# Patient Record
Sex: Male | Born: 1970
Health system: Southern US, Community
[De-identification: ages and names within clinical notes are randomized; demographics above are authoritative.]

## PROBLEM LIST (undated history)

## (undated) DIAGNOSIS — Z22322 Carrier or suspected carrier of Methicillin resistant Staphylococcus aureus: Secondary | ICD-10-CM

## (undated) DIAGNOSIS — F329 Major depressive disorder, single episode, unspecified: Secondary | ICD-10-CM

## (undated) DIAGNOSIS — Z8719 Personal history of other diseases of the digestive system: Secondary | ICD-10-CM

## (undated) DIAGNOSIS — F1910 Other psychoactive substance abuse, uncomplicated: Secondary | ICD-10-CM

## (undated) DIAGNOSIS — N200 Calculus of kidney: Secondary | ICD-10-CM

## (undated) DIAGNOSIS — G8929 Other chronic pain: Secondary | ICD-10-CM

## (undated) DIAGNOSIS — M549 Dorsalgia, unspecified: Secondary | ICD-10-CM

## (undated) DIAGNOSIS — Z87442 Personal history of urinary calculi: Secondary | ICD-10-CM

## (undated) DIAGNOSIS — F32A Depression, unspecified: Secondary | ICD-10-CM

## (undated) DIAGNOSIS — I10 Essential (primary) hypertension: Secondary | ICD-10-CM

## (undated) DIAGNOSIS — F419 Anxiety disorder, unspecified: Secondary | ICD-10-CM

## (undated) HISTORY — DX: Anxiety disorder, unspecified: F41.9

## (undated) HISTORY — DX: Major depressive disorder, single episode, unspecified: F32.9

## (undated) HISTORY — PX: OTHER SURGICAL HISTORY: SHX169

## (undated) HISTORY — DX: Other psychoactive substance abuse, uncomplicated: F19.10

## (undated) HISTORY — PX: CHOLECYSTECTOMY: SHX55

## (undated) HISTORY — DX: Calculus of kidney: N20.0

## (undated) HISTORY — DX: Depression, unspecified: F32.A

## (undated) HISTORY — DX: Carrier or suspected carrier of methicillin resistant Staphylococcus aureus: Z22.322

---

## 1997-05-13 ENCOUNTER — Encounter (HOSPITAL_COMMUNITY): Admission: RE | Admit: 1997-05-13 | Discharge: 1997-08-11 | Payer: Self-pay

## 1999-05-30 ENCOUNTER — Emergency Department (HOSPITAL_COMMUNITY): Admission: EM | Admit: 1999-05-30 | Discharge: 1999-05-30 | Payer: Self-pay | Admitting: Emergency Medicine

## 1999-05-30 ENCOUNTER — Encounter: Payer: Self-pay | Admitting: Emergency Medicine

## 2001-05-11 ENCOUNTER — Emergency Department (HOSPITAL_COMMUNITY): Admission: EM | Admit: 2001-05-11 | Discharge: 2001-05-12 | Payer: Self-pay | Admitting: Emergency Medicine

## 2001-05-12 ENCOUNTER — Encounter: Payer: Self-pay | Admitting: Emergency Medicine

## 2001-05-13 ENCOUNTER — Emergency Department (HOSPITAL_COMMUNITY): Admission: EM | Admit: 2001-05-13 | Discharge: 2001-05-13 | Payer: Self-pay | Admitting: Emergency Medicine

## 2001-07-01 ENCOUNTER — Emergency Department (HOSPITAL_COMMUNITY): Admission: EM | Admit: 2001-07-01 | Discharge: 2001-07-01 | Payer: Self-pay | Admitting: Emergency Medicine

## 2001-07-01 ENCOUNTER — Encounter: Payer: Self-pay | Admitting: Emergency Medicine

## 2003-04-16 ENCOUNTER — Emergency Department (HOSPITAL_COMMUNITY): Admission: EM | Admit: 2003-04-16 | Discharge: 2003-04-16 | Payer: Self-pay | Admitting: Emergency Medicine

## 2003-09-13 ENCOUNTER — Emergency Department (HOSPITAL_COMMUNITY): Admission: EM | Admit: 2003-09-13 | Discharge: 2003-09-13 | Payer: Self-pay

## 2003-10-03 ENCOUNTER — Emergency Department (HOSPITAL_COMMUNITY): Admission: EM | Admit: 2003-10-03 | Discharge: 2003-10-03 | Payer: Self-pay | Admitting: Emergency Medicine

## 2003-10-14 ENCOUNTER — Ambulatory Visit: Payer: Self-pay | Admitting: Family Medicine

## 2003-10-20 ENCOUNTER — Ambulatory Visit: Payer: Self-pay | Admitting: *Deleted

## 2003-10-20 ENCOUNTER — Ambulatory Visit: Payer: Self-pay | Admitting: Family Medicine

## 2003-11-11 ENCOUNTER — Ambulatory Visit: Payer: Self-pay | Admitting: *Deleted

## 2003-11-12 ENCOUNTER — Emergency Department (HOSPITAL_COMMUNITY): Admission: EM | Admit: 2003-11-12 | Discharge: 2003-11-12 | Payer: Self-pay | Admitting: Family Medicine

## 2003-11-13 ENCOUNTER — Inpatient Hospital Stay (HOSPITAL_COMMUNITY): Admission: EM | Admit: 2003-11-13 | Discharge: 2003-11-19 | Payer: Self-pay | Admitting: Emergency Medicine

## 2003-11-13 ENCOUNTER — Ambulatory Visit: Payer: Self-pay | Admitting: Internal Medicine

## 2003-11-14 HISTORY — PX: OTHER SURGICAL HISTORY: SHX169

## 2003-11-21 ENCOUNTER — Ambulatory Visit: Payer: Self-pay | Admitting: Family Medicine

## 2004-01-11 ENCOUNTER — Emergency Department (HOSPITAL_COMMUNITY): Admission: EM | Admit: 2004-01-11 | Discharge: 2004-01-11 | Payer: Self-pay | Admitting: Unknown Physician Specialty

## 2004-04-26 ENCOUNTER — Ambulatory Visit: Payer: Self-pay | Admitting: Family Medicine

## 2004-04-27 ENCOUNTER — Emergency Department (HOSPITAL_COMMUNITY): Admission: EM | Admit: 2004-04-27 | Discharge: 2004-04-27 | Payer: Self-pay | Admitting: Emergency Medicine

## 2004-07-01 ENCOUNTER — Emergency Department (HOSPITAL_COMMUNITY): Admission: EM | Admit: 2004-07-01 | Discharge: 2004-07-01 | Payer: Self-pay | Admitting: Emergency Medicine

## 2004-09-25 ENCOUNTER — Emergency Department (HOSPITAL_COMMUNITY): Admission: EM | Admit: 2004-09-25 | Discharge: 2004-09-25 | Payer: Self-pay | Admitting: Family Medicine

## 2004-11-06 ENCOUNTER — Ambulatory Visit: Payer: Self-pay | Admitting: Family Medicine

## 2004-11-21 ENCOUNTER — Emergency Department (HOSPITAL_COMMUNITY): Admission: EM | Admit: 2004-11-21 | Discharge: 2004-11-22 | Payer: Self-pay | Admitting: Emergency Medicine

## 2004-12-18 ENCOUNTER — Ambulatory Visit: Payer: Self-pay | Admitting: Family Medicine

## 2004-12-27 ENCOUNTER — Ambulatory Visit: Payer: Self-pay | Admitting: Family Medicine

## 2005-01-14 ENCOUNTER — Inpatient Hospital Stay (HOSPITAL_COMMUNITY): Admission: EM | Admit: 2005-01-14 | Discharge: 2005-01-19 | Payer: Self-pay | Admitting: Emergency Medicine

## 2005-01-30 ENCOUNTER — Encounter: Payer: Self-pay | Admitting: Emergency Medicine

## 2005-01-31 ENCOUNTER — Inpatient Hospital Stay (HOSPITAL_COMMUNITY): Admission: EM | Admit: 2005-01-31 | Discharge: 2005-02-06 | Payer: Self-pay | Admitting: Internal Medicine

## 2005-01-31 ENCOUNTER — Ambulatory Visit: Payer: Self-pay | Admitting: Physical Medicine & Rehabilitation

## 2005-01-31 ENCOUNTER — Ambulatory Visit: Payer: Self-pay | Admitting: Internal Medicine

## 2005-02-15 ENCOUNTER — Encounter
Admission: RE | Admit: 2005-02-15 | Discharge: 2005-05-16 | Payer: Self-pay | Admitting: Physical Medicine & Rehabilitation

## 2005-02-15 ENCOUNTER — Ambulatory Visit: Payer: Self-pay | Admitting: Physical Medicine & Rehabilitation

## 2005-03-19 ENCOUNTER — Ambulatory Visit: Payer: Self-pay | Admitting: Physical Medicine & Rehabilitation

## 2005-04-16 ENCOUNTER — Ambulatory Visit: Payer: Self-pay | Admitting: Physical Medicine & Rehabilitation

## 2005-05-14 ENCOUNTER — Encounter
Admission: RE | Admit: 2005-05-14 | Discharge: 2005-08-12 | Payer: Self-pay | Admitting: Physical Medicine & Rehabilitation

## 2005-06-12 ENCOUNTER — Ambulatory Visit: Payer: Self-pay | Admitting: Physical Medicine & Rehabilitation

## 2005-07-28 ENCOUNTER — Inpatient Hospital Stay (HOSPITAL_COMMUNITY): Admission: EM | Admit: 2005-07-28 | Discharge: 2005-08-01 | Payer: Self-pay | Admitting: Emergency Medicine

## 2005-07-29 ENCOUNTER — Ambulatory Visit: Payer: Self-pay | Admitting: Infectious Diseases

## 2005-07-30 ENCOUNTER — Encounter (INDEPENDENT_AMBULATORY_CARE_PROVIDER_SITE_OTHER): Payer: Self-pay | Admitting: Cardiology

## 2005-08-05 ENCOUNTER — Ambulatory Visit: Payer: Self-pay | Admitting: Physical Medicine & Rehabilitation

## 2005-08-29 ENCOUNTER — Encounter
Admission: RE | Admit: 2005-08-29 | Discharge: 2005-11-27 | Payer: Self-pay | Admitting: Physical Medicine & Rehabilitation

## 2005-09-26 ENCOUNTER — Ambulatory Visit: Payer: Self-pay | Admitting: Physical Medicine & Rehabilitation

## 2005-10-01 ENCOUNTER — Encounter
Admission: RE | Admit: 2005-10-01 | Discharge: 2005-12-30 | Payer: Self-pay | Admitting: Physical Medicine & Rehabilitation

## 2005-10-31 ENCOUNTER — Ambulatory Visit: Payer: Self-pay | Admitting: Physical Medicine & Rehabilitation

## 2005-11-28 ENCOUNTER — Ambulatory Visit: Payer: Self-pay | Admitting: Physical Medicine & Rehabilitation

## 2005-12-19 ENCOUNTER — Emergency Department (HOSPITAL_COMMUNITY): Admission: EM | Admit: 2005-12-19 | Discharge: 2005-12-19 | Payer: Self-pay | Admitting: Emergency Medicine

## 2005-12-29 ENCOUNTER — Emergency Department (HOSPITAL_COMMUNITY): Admission: EM | Admit: 2005-12-29 | Discharge: 2005-12-29 | Payer: Self-pay | Admitting: Emergency Medicine

## 2006-01-21 ENCOUNTER — Encounter
Admission: RE | Admit: 2006-01-21 | Discharge: 2006-04-21 | Payer: Self-pay | Admitting: Physical Medicine & Rehabilitation

## 2006-01-21 ENCOUNTER — Ambulatory Visit: Payer: Self-pay | Admitting: Physical Medicine & Rehabilitation

## 2006-03-17 ENCOUNTER — Ambulatory Visit: Payer: Self-pay | Admitting: Physical Medicine & Rehabilitation

## 2006-04-14 ENCOUNTER — Encounter
Admission: RE | Admit: 2006-04-14 | Discharge: 2006-07-13 | Payer: Self-pay | Admitting: Physical Medicine & Rehabilitation

## 2006-05-09 ENCOUNTER — Ambulatory Visit: Payer: Self-pay | Admitting: Physical Medicine & Rehabilitation

## 2006-07-08 ENCOUNTER — Ambulatory Visit: Payer: Self-pay | Admitting: Physical Medicine & Rehabilitation

## 2006-07-31 ENCOUNTER — Encounter
Admission: RE | Admit: 2006-07-31 | Discharge: 2006-10-29 | Payer: Self-pay | Admitting: Physical Medicine & Rehabilitation

## 2006-09-02 ENCOUNTER — Ambulatory Visit: Payer: Self-pay | Admitting: Physical Medicine & Rehabilitation

## 2006-10-27 ENCOUNTER — Ambulatory Visit: Payer: Self-pay | Admitting: Physical Medicine & Rehabilitation

## 2006-10-27 ENCOUNTER — Encounter
Admission: RE | Admit: 2006-10-27 | Discharge: 2006-11-26 | Payer: Self-pay | Admitting: Physical Medicine & Rehabilitation

## 2006-12-29 ENCOUNTER — Ambulatory Visit: Payer: Self-pay | Admitting: Physical Medicine & Rehabilitation

## 2006-12-29 ENCOUNTER — Encounter
Admission: RE | Admit: 2006-12-29 | Discharge: 2007-03-29 | Payer: Self-pay | Admitting: Physical Medicine & Rehabilitation

## 2007-02-20 ENCOUNTER — Ambulatory Visit: Payer: Self-pay | Admitting: Physical Medicine & Rehabilitation

## 2007-03-20 ENCOUNTER — Encounter
Admission: RE | Admit: 2007-03-20 | Discharge: 2007-06-18 | Payer: Self-pay | Admitting: Physical Medicine & Rehabilitation

## 2007-04-15 ENCOUNTER — Ambulatory Visit: Payer: Self-pay | Admitting: Physical Medicine & Rehabilitation

## 2007-05-19 ENCOUNTER — Ambulatory Visit: Payer: Self-pay | Admitting: Physical Medicine & Rehabilitation

## 2007-06-01 ENCOUNTER — Emergency Department (HOSPITAL_COMMUNITY): Admission: EM | Admit: 2007-06-01 | Discharge: 2007-06-02 | Payer: Self-pay | Admitting: Emergency Medicine

## 2007-06-17 ENCOUNTER — Ambulatory Visit: Payer: Self-pay | Admitting: Physical Medicine & Rehabilitation

## 2007-07-17 ENCOUNTER — Encounter
Admission: RE | Admit: 2007-07-17 | Discharge: 2007-10-15 | Payer: Self-pay | Admitting: Physical Medicine & Rehabilitation

## 2007-07-20 ENCOUNTER — Ambulatory Visit: Payer: Self-pay | Admitting: Physical Medicine & Rehabilitation

## 2007-08-18 ENCOUNTER — Ambulatory Visit: Payer: Self-pay | Admitting: Physical Medicine & Rehabilitation

## 2007-09-03 ENCOUNTER — Emergency Department (HOSPITAL_COMMUNITY): Admission: EM | Admit: 2007-09-03 | Discharge: 2007-09-03 | Payer: Self-pay | Admitting: Emergency Medicine

## 2007-09-16 ENCOUNTER — Ambulatory Visit: Payer: Self-pay | Admitting: Physical Medicine & Rehabilitation

## 2007-10-15 ENCOUNTER — Encounter
Admission: RE | Admit: 2007-10-15 | Discharge: 2008-01-13 | Payer: Self-pay | Admitting: Physical Medicine & Rehabilitation

## 2007-10-16 ENCOUNTER — Ambulatory Visit: Payer: Self-pay | Admitting: Physical Medicine & Rehabilitation

## 2007-11-13 ENCOUNTER — Ambulatory Visit: Payer: Self-pay | Admitting: Physical Medicine & Rehabilitation

## 2007-12-11 ENCOUNTER — Ambulatory Visit: Payer: Self-pay | Admitting: Physical Medicine & Rehabilitation

## 2008-01-06 ENCOUNTER — Encounter
Admission: RE | Admit: 2008-01-06 | Discharge: 2008-04-05 | Payer: Self-pay | Admitting: Physical Medicine & Rehabilitation

## 2008-01-11 ENCOUNTER — Ambulatory Visit: Payer: Self-pay | Admitting: Physical Medicine & Rehabilitation

## 2008-02-08 ENCOUNTER — Ambulatory Visit (HOSPITAL_COMMUNITY)
Admission: RE | Admit: 2008-02-08 | Discharge: 2008-02-08 | Payer: Self-pay | Admitting: Physical Medicine & Rehabilitation

## 2008-02-10 ENCOUNTER — Ambulatory Visit: Payer: Self-pay | Admitting: Physical Medicine & Rehabilitation

## 2008-03-03 ENCOUNTER — Emergency Department (HOSPITAL_COMMUNITY): Admission: EM | Admit: 2008-03-03 | Discharge: 2008-03-03 | Payer: Self-pay | Admitting: Emergency Medicine

## 2008-03-09 ENCOUNTER — Ambulatory Visit: Payer: Self-pay | Admitting: Physical Medicine & Rehabilitation

## 2008-04-05 ENCOUNTER — Encounter
Admission: RE | Admit: 2008-04-05 | Discharge: 2008-07-04 | Payer: Self-pay | Admitting: Physical Medicine & Rehabilitation

## 2008-04-06 ENCOUNTER — Ambulatory Visit: Payer: Self-pay | Admitting: Physical Medicine & Rehabilitation

## 2008-05-04 ENCOUNTER — Ambulatory Visit: Payer: Self-pay | Admitting: Physical Medicine & Rehabilitation

## 2008-06-01 ENCOUNTER — Ambulatory Visit: Payer: Self-pay | Admitting: Physical Medicine & Rehabilitation

## 2008-06-29 ENCOUNTER — Ambulatory Visit: Payer: Self-pay | Admitting: Physical Medicine & Rehabilitation

## 2008-07-13 ENCOUNTER — Ambulatory Visit: Payer: Self-pay | Admitting: Physical Medicine & Rehabilitation

## 2008-07-13 ENCOUNTER — Emergency Department (HOSPITAL_COMMUNITY): Admission: EM | Admit: 2008-07-13 | Discharge: 2008-07-14 | Payer: Self-pay | Admitting: Emergency Medicine

## 2008-07-13 ENCOUNTER — Encounter
Admission: RE | Admit: 2008-07-13 | Discharge: 2008-07-13 | Payer: Self-pay | Admitting: Physical Medicine & Rehabilitation

## 2008-07-14 ENCOUNTER — Emergency Department: Payer: Self-pay | Admitting: Emergency Medicine

## 2008-12-01 ENCOUNTER — Inpatient Hospital Stay (HOSPITAL_COMMUNITY): Admission: EM | Admit: 2008-12-01 | Discharge: 2008-12-02 | Payer: Self-pay | Admitting: Emergency Medicine

## 2008-12-01 ENCOUNTER — Encounter (INDEPENDENT_AMBULATORY_CARE_PROVIDER_SITE_OTHER): Payer: Self-pay | Admitting: Surgery

## 2010-05-14 ENCOUNTER — Emergency Department (HOSPITAL_COMMUNITY)
Admission: EM | Admit: 2010-05-14 | Discharge: 2010-05-14 | Discharge status: Against Medical Advice | Payer: Self-pay | Attending: Emergency Medicine | Admitting: Emergency Medicine

## 2010-05-14 DIAGNOSIS — Z79899 Other long term (current) drug therapy: Secondary | ICD-10-CM | POA: Insufficient documentation

## 2010-05-14 DIAGNOSIS — M549 Dorsalgia, unspecified: Secondary | ICD-10-CM | POA: Insufficient documentation

## 2010-05-17 LAB — URINALYSIS, ROUTINE W REFLEX MICROSCOPIC
Bilirubin Urine: NEGATIVE
Hgb urine dipstick: NEGATIVE
Nitrite: NEGATIVE
Specific Gravity, Urine: 1.015 (ref 1.005–1.030)
pH: 8.5 — ABNORMAL HIGH (ref 5.0–8.0)

## 2010-05-17 LAB — ETHANOL: Alcohol, Ethyl (B): <5 mg/dL (ref 0–10)

## 2010-05-17 LAB — DIFFERENTIAL
Basophils Absolute: 0.1 10*3/uL (ref 0.0–0.1)
Basophils Relative: 1 % (ref 0–1)
Eosinophils Absolute: 0 10*3/uL (ref 0.0–0.7)
Eosinophils Relative: 0 % (ref 0–5)
Lymphocytes Relative: 16 % (ref 12–46)
Lymphs Abs: 2.1 10*3/uL (ref 0.7–4.0)
Monocytes Absolute: 0.8 10*3/uL (ref 0.1–1.0)
Monocytes Relative: 6 % (ref 3–12)
Neutro Abs: 9.6 10*3/uL — ABNORMAL HIGH (ref 1.7–7.7)
Neutrophils Relative %: 76 % (ref 43–77)

## 2010-05-17 LAB — RAPID URINE DRUG SCREEN, HOSP PERFORMED
Cocaine: POSITIVE — AB
Opiates: POSITIVE — AB
Tetrahydrocannabinol: POSITIVE — AB

## 2010-05-17 LAB — COMPREHENSIVE METABOLIC PANEL
ALT: 22 U/L (ref 0–53)
AST: 22 U/L (ref 0–37)
Albumin: 3.7 g/dL (ref 3.5–5.2)
Alkaline Phosphatase: 66 U/L (ref 39–117)
BUN: 21 mg/dL (ref 6–23)
CO2: 29 mEq/L (ref 19–32)
Calcium: 9.3 mg/dL (ref 8.4–10.5)
Chloride: 100 mEq/L (ref 96–112)
Creatinine, Ser: 1.36 mg/dL (ref 0.4–1.5)
GFR calc Af Amer: >60 mL/min (ref >60)
GFR calc non Af Amer: 59 mL/min — ABNORMAL LOW (ref >60)
Glucose, Bld: 130 mg/dL — ABNORMAL HIGH (ref 70–99)
Potassium: 3.9 mEq/L (ref 3.5–5.1)
Sodium: 139 mEq/L (ref 135–145)
Total Bilirubin: 0.4 mg/dL (ref 0.3–1.2)
Total Protein: 7.5 g/dL (ref 6.0–8.3)

## 2010-05-17 LAB — POCT CARDIAC MARKERS: Troponin i, poc: <0.05 ng/mL (ref 0.00–0.09)

## 2010-05-17 LAB — CBC
HCT: 39.8 % (ref 39.0–52.0)
Hemoglobin: 13.9 g/dL (ref 13.0–17.0)
MCHC: 34.8 g/dL (ref 30.0–36.0)
MCV: 92 fL (ref 78.0–100.0)
Platelets: 232 10*3/uL (ref 150–400)
RBC: 4.32 MIL/uL (ref 4.22–5.81)
RDW: 13.6 % (ref 11.5–15.5)
WBC: 12.6 10*3/uL — ABNORMAL HIGH (ref 4.0–10.5)

## 2010-05-17 LAB — LIPASE, BLOOD: Lipase: 29 U/L (ref 11–59)

## 2010-05-21 LAB — CBC
HCT: 45.5 % (ref 39.0–52.0)
Hemoglobin: 15.8 g/dL (ref 13.0–17.0)
RDW: 13.2 % (ref 11.5–15.5)
WBC: 6.8 10*3/uL (ref 4.0–10.5)

## 2010-05-21 LAB — DIFFERENTIAL
Eosinophils Relative: 0 % (ref 0–5)
Lymphocytes Relative: 14 % (ref 12–46)
Lymphs Abs: 1 10*3/uL (ref 0.7–4.0)
Monocytes Absolute: 0.2 10*3/uL (ref 0.1–1.0)
Monocytes Relative: 3 % (ref 3–12)

## 2010-05-21 LAB — BASIC METABOLIC PANEL
GFR calc non Af Amer: >60 mL/min (ref >60)
Glucose, Bld: 107 mg/dL — ABNORMAL HIGH (ref 70–99)
Potassium: 4.1 mEq/L (ref 3.5–5.1)
Sodium: 141 mEq/L (ref 135–145)

## 2010-05-21 LAB — RAPID URINE DRUG SCREEN, HOSP PERFORMED
Amphetamines: NOT DETECTED
Benzodiazepines: POSITIVE — AB
Tetrahydrocannabinol: NOT DETECTED

## 2010-06-26 NOTE — Assessment & Plan Note (Signed)
[name] is back regarding his back pain.  He has had increasing problems  over the last few months with back pain and pain radiating into both  legs now.  He has had tingling and weakness in both legs as well.  Pain  seems to wax and wane, but he can often wakeup and be in significant  pain one morning and not too much the next.  He is unable to work at  this time due to his poor activity tolerance.  He rates his pain as 8-  9/10.  He remains on methadone 40 mg q.i.d. and oxycodone for  breakthrough symptoms.  He has been limited overall due to financial  resources.  He is on Celexa at bedtime as well as Soma for spasms and  Xanax.   REVIEW OF SYSTEMS:  Notable for the above as well as weight loss and  decreased appetite.   SOCIAL HISTORY:  The patient is single and not working at present due to  the above.   PHYSICAL EXAMINATION:  Blood pressure is 139/81, pulse is 86,  respiratory rate 20, and sating 98% on room air.  The patient is  pleasant, in noticeable distress today.  He rose to stand and had  difficulty standing on his own in an erect posture.  He has significant  tenderness over the lower lumbar spine and particularly into the left  iliac crest region.  Pain was worse with any type of bending and  extension today.  He had subjective weakness in both legs and no obvious  sensory loss.  Reflexes were 1+.  Weight was noticeably down.  HEART:  Regular.  CHEST:  Clear.  ABDOMEN:  Soft and nontender.   ASSESSMENT AND PLAN:  1. Chronic low back pain.  The patient with signs of worsening      stenosis.  He has not had an MRI of his back for sometime.  I think      at this point with the worsening lower extremity neurological      symptoms and increasing pain that it is warranted.  2. Refill his methadone and oxycodone.  3. Continue Celexa for mood.  4. Soma p.r.n. for spasm.  5. Added Tofranil-PM 75 mg nightly for neuropathic symptoms and his      sleep.  The patient was also  given Voltaren gel and Flector patches      today.  6. We will see him back in 3 months per schedule depending on his MRI      results which will have to schedule as he is approved financially      to have one done.      Ranelle Oyster, M.D.  Electronically Signed     ZTS/MedQ  D:  07/20/2007 11:31:00  T:  07/20/2007 22:32:24  Job #:  [id number]

## 2010-06-26 NOTE — Assessment & Plan Note (Signed)
FOLLOW UP VISIT   [name] is back, regarding his back pain.  He went to camp over the summer  and had a hard time making the grade, as he was having to be on his feet  10 hours a day.  He stated that he was treated very well, but he just  could not keep up with the activity level.  Pain is 7/10.  He states the  pain radiates from his back into his legs.  He complains of a lot of  numbness in his legs and feet.  He describes the pain as stabbing,  constant, and aching.  Pain interferes with general activity, relations  with others, and enjoyment of life on a moderate-to-severe level.   REVIEW OF SYSTEMS:  Notable see above.  Full review in the written  health and history section.   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   PHYSICAL EXAMINATION:  Blood pressure is 129/90, pulse is 94,  respiratory rate 18, he is sating 99% on room air.  The patient is  pleasant, alert and oriented times three.  Affect is bright and  appropriate.  He is limited still with his lumbar flexion and extension.  He has pain with palpation of the low back.  Motor and sensory exam were  essentially stable.  Reflexes 1+ to 2+ in both legs.  He remained  disheveled in appearance.   ASSESSMENT:  1. Chronic lumbar spondylosis.  2. History of poly substance abuse.  3. History of depression/bi-polar disorder.  4. Tobacco abuse.  5. History of questionable fibromyalgia.   PLAN:  1. Continue methadone 30 mg q.i.d.  2. Refilled Oxycodone, #60.  3. Refilled Xanax 1 mg, #90.  4. Gave the patient samples of Soma for spasm.  I also handed him      [place] exercise to work on posture and core muscle positioning      and strengthening.  Continue with range of motion and other      modalities as able at home.  5. I will see him back in three months with nurse/clinic followup in      one month.      Ranelle Oyster, M.D.  Electronically Signed     ZTS/MedQ  D:  10/03/2006  14:58:09  T:  10/04/2006 15:53:10  Job #:  [id number]

## 2010-06-26 NOTE — Assessment & Plan Note (Signed)
[name] is back regarding his chronic back pain and multiple pain  complaints.  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  He needs to try to focus on weight loss but is having  difficulty losing weight.  He is concerned of his chronic weight gain  over the last several years.  Sleep is fair.  His mood has occasionally  been a problem due to some anxiety issues.  He has not seen a  psychiatrist for some time.   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   REVIEW OF SYSTEMS:  Notable for the above.  Full review is in the health  and history section of the chart.   PHYSICAL EXAM:  Blood pressure is 148/87.  Pulse is 75.  Respiratory  rate 18.  He is satting 97% on room air.  Patient is pleasant, alert and  oriented x3.  He is a bit nervous as usual but otherwise appropriate.  Back remained stable on extension and flexion.  He continues to have  paraspinal pain in the lower lumbar aspects.  Reflexes and lower  extremity strength are within normal limits.  Sensory exam is stable.   ASSESSMENT:  1. Chronic lumbar spondylosis.  2. History of polysubstance abuse.  3. Depression/bipolar disorder.  4. Tobacco abuse.  5. Questionable fibromyalgia syndrome.   PLAN:  1. Continue methadone 40 mg q.i.d.  2. Refilled Xanax today, #90.  3. Changed oxycodone over to 5 mg tablets due to current shortage in      the 15 mg tablets.  We will use 3 every 12 hours p.r.n., #180.  4. We will start Celexa 20 mg q.h.s. for anxiety and mood control.  5. I will see him back in 3 months with nurse clinic followup in 1      month's time.  Encouraged ongoing socialization and vocational      activities, as well as appropriate exercise.      Ranelle Oyster, M.D.  Electronically Signed     ZTS/MedQ  D:  03/23/2007 16:46:52  T:  03/24/2007 15:47:28  Job #:  [id number]

## 2010-06-26 NOTE — Assessment & Plan Note (Signed)
[name] is back regarding his low back pain and questionable fibromyalgia.   He has been working part-time jobs the last month or so and making ends  meet.  He has been active with his exercise and Pilates work.  He feels  that he has gotten stronger.  He is still trying to work on his weight  as he feels that he has gotten fat.  He rates his pain as a 5/5.  He  states that his methadone at the 30 mg is not controlling his pain like  it once was and he has had some days where his pain has been very bad  and incapacitated him.  Sometimes pain seems to be affecting his mood  and causing him increased depression and anxiety.  He is thinking of  resuming his psychiatric care which he had to stop for financial  reasons.  The patient rates his sleep as poor at times.  Pain interferes  with his general activity, relations with others, and enjoyment of life  on a moderate level.   REVIEW OF SYSTEMS:  Notable for tingling, spasm, depression, anxiety,  other pertinent positives are listed above and a full review is in the  written health and history section.   SOCIAL HISTORY:  The patient is working as noted above.  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   PHYSICAL EXAMINATION:  VITAL SIGNS:  Blood pressure is 162/94, pulse is  111, respiratory rate 18.  He is sating 95% on room air.  GENERAL:  The patient is generally pleasant.  Alert and oriented x3.  He  is a bit more anxious and emotional today than other times.  MUSCULOSKELETAL:  His back is stable in respect to flexion extension.  He still has pain with palpation over the lumbar paraspinal muscles and  facets.  Reflexes are 2 plus.  Motor function is intact.  His appearance  remains slightly disheveled, although improved.   ASSESSMENT:  1. Chronic lumbar spondylosis.  2. History of polysubstance abuse.  3. History of depression/bipolar disorder.  4. Tobacco abuse.  5. History of a questionable fibromyalgia.    PLAN:  1. We will increase methadone back to 40 mg q.i.d.  2. I refilled his oxycodone and Xanax #60 and #90 respectively as      well.  3. I encouraged psychiatric followup.  4. Soma p.r.n. for muscle relaxing effects.  5. Encouraged regular exercise, stretching, range of motion, and core      muscle strengthening, etcetera.  I think this is where his value is      rather than continuing to increase medications.      [name], M.D.  Electronically Signed     ZTS/MedQ  D:  12/29/2006 13:04:14  T:  12/29/2006 21:30:46  Job #:  [id number]

## 2010-06-26 NOTE — Assessment & Plan Note (Signed)
[name] is back regarding his low back pain.  He is down to 30 mg q.i.d. of  methadone and continues to do well.  He complains of some more fatigue,  but he feels that his pain is actually better on the lower dose.  He  rates his pain a 4/10.  He uses oxycodone 15 mg for breakthrough pain  every 12 hours as needed.  He has been active.  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   REVIEW OF SYSTEMS:  Notable for the above.  Full review is in the  written health and history section.   SOCIAL HISTORY:  The patient has moved out of his house and will living  at the camp this summer.   PHYSICAL EXAMINATION:  Blood pressure is 146/77, pulse is 89,  respiratory rate is 18, he is sating 99% on room air.  The patient is  pleasant, alert and oriented x 3. Affect is bright and generally  appropriate.  He has mild pain with lumbar flexion and extension today.  He was able to bend to about 60 degrees of flexion and 10 degrees of  extension.  He had a minimally positive straight leg testing today.  His  strength was 5/5 and reflexes were 2+.  Generally he is disheveled in  appearance.   ASSESSMENT:  1. Chronic lumbar spondylosis.  2. History of polysubstance abuse which appears to be under control.  3. History of depression/bipolar disorder.  4. Tobacco abuse.  5. Questionable fibromyalgia.   PLAN:  1. Continue methadone 30 mg q.i.d. #360.  2. Refill oxycodone #60.  3. Refill Xanax 1 mg #90.  The patient was instructed to bring empty      bottles to the office at next visit so that we can perform      medication counts.  4. I will see him back in three months' time.      Ranelle Oyster, M.D.  Electronically Signed     ZTS/MedQ  D:  07/09/2006 13:08:51  T:  07/09/2006 13:57:32  Job #:  [id number]

## 2010-06-26 NOTE — Assessment & Plan Note (Signed)
[name] is back regarding his low back pain.  He states that his pain is  little bit over the last few months.  He has been working on better diet  and weight loss and he has lost a great deal of weight.  His pain is  down to 4/10.  He has been working either recently.  His medications  have not changed other than the Tofranil we added which he feels has  helped his sleep and somewhat his neuropathic symptoms.  The patient has  pain in the low back radiating to the back and both legs.  We considered  an MRI at his last visit, considering worsening pain that was going on  last spring and early summer.   REVIEW OF SYSTEMS:  Notable for the above.  Full review is in the  written health and history section in the chart.   SOCIAL HISTORY:  The patient again is single and is not working  currently.   PHYSICAL EXAMINATION:  Blood pressure is 146/102 which was confirmed  after repeat, pulse 89, respiratory rate 18, and he is saturating 98% on  room air.  The patient is pleasant, alert and oriented x3.  He was  standing much better today.  He lost noticeable weight.  He has some  pain still over the lower lumbar spinal segments and paraspinal muscles.  He is able to bend to about 50 degrees today without significant pain,  although we got to that point in past there, he started to have some  tightness and apprehension.  No sensory loss was seen.  Reflexes are 1+.  Heart is regular.  Chest is clear.  Abdomen is soft and nontender.   ASSESSMENT:  1. Chronic low back pain which is improved with exercise and better      diet associated with his weight loss.  2. Insomnia.  3. History of polysubstance abuse.  4. Depression/bipolar disorder.  5. Tobacco abuse.   PLAN:  1. We will refill methadone #480 today 10 mg.  2. Refill the oxycodone 15 mg 1 q.12 h. p.r.n.  3. Continue Celexa and Tofranil.  He was given Tofranil samples today.  4. Continue exercise and diet as is.  5. Check UDS at nurse  followup in 1 month.      Ranelle Oyster, M.D.  Electronically Signed     ZTS/MedQ  D:  10/16/2007 16:49:15  T:  10/17/2007 05:50:37  Job #:  [id number]

## 2010-06-26 NOTE — Assessment & Plan Note (Signed)
[name] is back regarding his chronic back pain.  Some suspicious  behaviors were noted over the last several weeks.  Pharmacy has  contacted [place] regarding awkward and frankly strange requests regarding  his methadone.  The patient was asked to come in for a pill count and  resisted stating that his job wouldn't allow him away.  He provided [place]  with a phone number which we were to call to confirm that he was  truly  working in [name]. When this number was called a man answered the phone  and sounded half asleep and was apparently lying his bed.  [name] was  asked to come in again for a pill count  and eventually did last week.  Pills were found to be altered, marked upon varying in size and shape,  etc.  He came back to visit me today and discuss these issues.  He tells  me that now his girlfriend left him and he is concerned that she took  some of his pills leaving him short of methadone.   Pain level is 7/10.  Pain is interfering with general activity, in  relations with others, enjoyment of life on a moderate level.  Other  pertinent positives are above and full review is in the written health  and history section of the chart.   SOCIAL HISTORY:  It is noted above.   PHYSICAL EXAMINATION:  VITAL SIGNS:  Blood pressure is 131/78, pulse is  96, respiratory rate 80.  He is sating 100% on room air.  GENERAL:  The patient is anxious and quiet overall today.  HEART:  Regular.  CHEST:  Clear.  ABDOMEN:  Soft, nontender.  BACK:  Generally stable with respect to range of motion and pain.   ASSESSMENT:  1. Chronic low back pain.  2. History of polysubstance abuse.  3. Bipolar/depression disorder.   PLAN:  1. I explained to [name] that we would not any longer sponsor his use of      methadone.  It is evident by these actions as of late as well as      pill counts, etc. that he is not using these medications as      expected and likely is diverting them in some manner.  We have  contacted the [facility] about assisting with [place] with his      detoxification.  For now, we will fill 2 weeks of methadone as      current dose.  I dropped his oxycodone from 15 mg to 5 mg q.12 h      p.r.n.  2. He will come back in 2 weeks with a nurse to verify his pill count      and to prescribe him further medication if needed at that point,      depending on his Methadone Clinic admission.  If we continue to      prescribe the patient medications at that point, we will stop his      oxycodone and just stay with his methadone at the current schedule      for the time being.       Ranelle Oyster, M.D.  Electronically Signed     ZTS/MedQ  D:  06/29/2008 12:53:03  T:  06/30/2008 02:26:39  Job #:  [id number]

## 2010-06-26 NOTE — Assessment & Plan Note (Signed)
[name] is back regarding his chronic back pain.  He has been generally  stable from a standpoint of his pain.  He has been working more, he has  lost further weight.  His pain usually ranges from 4-5/10.  It was  definitely worsened with walking, bending, sitting, prolonged activity.  He is working, doing Psychologist, clinical still, and stays very active,  but usually is exhausted by the end of the day.  He did purchase an  expensive new mattress recently and is hoping that he will have better  night sleep with this.  He already notes improvement in his back on this  mattress.   REVIEW OF SYSTEMS:  Notable for the above.  Full review is in the  written health history section of the chart.   SOCIAL HISTORY:  Noted above.  He states he does have a girlfriend now  he is having fun with that.   Oswestry score today is 40%.   PHYSICAL EXAMINATION:  VITAL SIGNS:  Blood pressure is 122/74, pulse is  101, respiratory 80.  He is sating 99% on room air.  GENERAL:  The patient is pleasant, alert, and oriented x3.  He has had  some pain with flexion with extension.  He has lost noticeable weight.  EXTREMITIES:  His strength remains 5/5.  Normal sensory exam.  NEURO:  Cognitively, he has normal insight, memory, awareness etc.  HEART:  Regular.  CHEST:  Clear.  ABDOMEN:  Soft, nontender.   ASSESSMENT:  1. Chronic low back pain.  2. Insomnia.  3. History of polysubstance abuse, although the patient has been clean      well with [place].  4. Depression/bipolar disorder.  5. Tobacco abuse.   PLAN:  1. Continue methadone 480 tablets 10 mg.  We will begin tapering this      over the course of this year.  2. Continue oxycodone 15 mg q.12 h p.r.n. breakthrough pain.  May be      useful perhaps to add an extra oxycodone in the evening as we begin      to taper methadone down.  3. Continue Celexa, but replace Tofranil with Elavil 10-20 mg nightly.  4. We have given a trial of Tegretol for centralized  pain 200 mg      nightly getting next week and then increase to b.i.d. week      following.  5. I will see him back in 3 months with 52-month Nursing Clinic      followup.  We will check a UDS over the next few months.      Ranelle Oyster, M.D.  Electronically Signed     ZTS/MedQ  D:  04/06/2008 13:05:40  T:  04/07/2008 01:01:18  Job #:  [id number]

## 2010-06-26 NOTE — Assessment & Plan Note (Signed)
Emma is back regarding his low back pain.  He has generally been  doing well over the last few months.  He has lost further weight.  He is  now down almost 40 pounds.  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  He is now making some money as well.  His pain is about 5/10.  He still has not got his EKG as of yet.  He denies any other issues.  His mood has been better especially with his improved financial and  locational exploits.   REVIEW OF SYSTEMS:  Notable for the above.  Full review is in the  written health and history section in the chart.  He is staying regular  with his bowel and bladder movements.  Full review is in the written  health and history section in the chart.   SOCIAL HISTORY:  As above.   PHYSICAL EXAMINATION:  VITAL SIGNS:  Blood pressure is 150/79, pulse is  101, respiratory rate 18, and he is sating 95% on room air.  GENERAL:  The patient is pleasant, alert, and oriented x3.  His mood has  been better.  His weight is down.  He is able to flex 50-60 degrees with  some pain, but functional.  Strength is 5/5.  Normal sensory exam.  Cognitively, he is intact.  HEART:  Regular.  CHEST:  Clear.  ABDOMEN:  Soft and nontender.   ASSESSMENT:  1. Chronic low back pain.  2. Insomnia.  3. History of polysubstance abuse.  4. Depression/bipolar disorder.  5. Tobacco abuse.   PLAN:  1. Continue methadone #480, 10 mg.  The patient was told that he needs      to have EKG done or I will not fill for this methadone script.  2. Oxycodone 15 mg 1 q.12 h. p.r.n., #60.  3. Continue Celexa and Tofranil as able.  4. See the patient back in 3 months with me and in 1 month with      nursing.      Ranelle Oyster, M.D.  Electronically Signed     ZTS/MedQ  D:  01/11/2008 13:56:45  T:  01/12/2008 03:22:10  Job #:  [id number]

## 2010-06-29 NOTE — Assessment & Plan Note (Signed)
DATE OF VISIT:  March 19, 2005.   HISTORY OF PRESENT ILLNESS:  [name] is here in followup of his low back and  leg pain.  He still has no insurance.  He states the Methadone has been  helping somewhat for his pain.  He is taking a dose at 6 o'clock in the  morning and then around 10 o'clock, and then around dinnertime.  He is  finding that the gap between dinner and breakfast leaves him with some  sweats and increased pain as well as some jitteriness.  He says his  psychiatrist gave him Seroquel to take at night, which he has used on a  p.r.n. basis and this has helped somewhat.  He remains on Effexor 225 mg a  day as well as Xanax 2 mg three times a day.  In general, his pain is about  a 7 or 8 when he wakes up in the morning and decreases during the day to a 4  out of 10.  The pain is most prominent in the low back and radiating into  the legs and into the feet as well.  The pain is described as sharp and  burning in quality.  The pain interferes with general activity,  relationships with others, and enjoyment of life on a moderate level.   REVIEW OF SYSTEMS:  The patient reports some constipation.  He has used some  over-the-counter remedies as well as herbal supplements with plus or minus  results.  He has had some nighttime sweats and chills.  Other review of  system items are mentioned above or in the health and history section.   SOCIAL HISTORY:  The patient is living with his parents currently.   PHYSICAL EXAMINATION:  VITAL SIGNS:  Blood pressure is 121/67, pulse is 90,  respiratory rate is 16, saturation is 99% on room air.  GENERAL:  The patient is pleasant and in no acute distress.  Mood is fair.  He is a little less anxious that he has been.  Gait is stable.  Coordination  is intact.  Reflexes are 2+.  Sensation is normal.  HEART:  Regular rate and rhythm.  LUNGS:  Clear.  ABDOMEN:  Soft and nontender.  EXTREMITIES:  Motor function is general a 5 out of 5 throughout all  four  extremities.  BACK:  He had pain in the low back with flexion, more so in  extension today.  Range of motion was fair.   ASSESSMENT:  1.  Chronic low back pain.  2.  Polysubstance abuse.  3.  Depression/bipolar disorder.  4.  Tobacco abuse.  5.  Recent cellulitis.   PLAN:  1.  Will increase Methadone schedule to 40 mg q.i.d.  Hopefully, this will      cover his nighttime symptoms.  I told him that we would not go up any      further on this medication and he understands.  2.  We collected another urine sample today.  3.  I encouraged exercise and back range of motion.  4.  I gave some recommendations regarding his bowel regimen.  5.  I will see the patient back in two months time.  He will see the RN      Clinic in one month.     Ranelle Oyster, M.D.  Electronically Signed    ZTS/MedQ  D:  03/19/2005 14:59:31  T:  03/19/2005 21:13:40  Job #:  [id number]

## 2010-06-29 NOTE — Consult Note (Signed)
[name], [name]             ACCOUNT NO.:  [id number]   MEDICAL RECORD NO.:  [id number]          PATIENT TYPE:  INP   LOCATION:  5710                         FACILITY:  [facility]   PHYSICIAN:  Abigail Miyamoto, M.D. DATE OF BIRTH:  1970-05-10   DATE OF CONSULTATION:  11/14/2003  DATE OF DISCHARGE:                                   CONSULTATION   REASON FOR CONSULTATION:  Right arm abscess.   HISTORY:  This is a 40 year old gentleman admitted to the teaching service  for right arm cellulitis.  He has since had a CAT scan this afternoon  showing him to have an abscess, therefore, general surgery has been  consulted.  The patient reports that approximately a month ago, he underwent  a blood draw in his arm and since then, has had some erythema and responded  to antibiotics initially but now has gotten much worse and he now has severe  right arm pain.  He was admitted today with severe cellulitis.  He also has  a history of drug abuse, anxiety, and depression.  The patient reports he  has not had any kind of intravenous drugs for a year.  He also has no  bleeding history and takes no blood thinning medications.   PAST MEDICAL HISTORY:  Medical history unremarkable.   PAST SURGICAL HISTORY:  Negative.   REVIEW OF SYMPTOMS:  Negative for chest pain or shortness of breath.   ALLERGIES:  His only allergy is to hydrocodone which causes him to have  nausea and vomiting.   PHYSICAL EXAMINATION:  GENERAL:  Well developed, well nourished gentleman in obvious discomfort.  VITAL SIGNS:  Temperature 98.5, pulse 86, respiratory rate 20, blood  pressure 123/65.   Examination is limited to his right arm.  He has significant amount of  cellulitis in the right arm proximal to and including the antecubital fossa  on the medial aspect of the arm around the biceps.  Pulses are intact  distally and there is no swelling in the hand.  He also appears to be  neurologically intact.   LABORATORY DATA:   The patient has a CBC showing white count 9.5, hemoglobin  11.7, platelets 295.  Urine drug screen is negative.  The patient has a CAT  scan of the right arm which shows him to have a 9 by 4 cm right arm abscess  along his biceps and the subcutaneous tissues.   IMPRESSION AND PLAN:  The patient has a right arm abscess.  At this point,  incision and drainage is needed.  This will best be performed in the  operating room under anesthesia.  I discussed with the patient in detail  including the risks of bleeding and infection as well as the need to leave  this as an open wound with wound care and packing.  At this point, he wishes  to proceed.  Surgery will be scheduled emergently.       DB/MEDQ  D:  11/14/2003  T:  11/14/2003  Job:  [id number]

## 2010-06-29 NOTE — Discharge Summary (Signed)
[name], [name] NO.:  [id number]   MEDICAL RECORD NO.:  [id number]          PATIENT TYPE:  INP   LOCATION:  5036                         FACILITY:  [facility]   PHYSICIAN:  Nelma Rothman, MD   DATE OF BIRTH:  Nov 02, 1970   DATE OF ADMISSION:  01/31/2005  DATE OF DISCHARGE:  02/06/2005                                 DISCHARGE SUMMARY   PRIMARY CARE PHYSICIAN:  Dr. Theodoro Grist.   ORTHOPEDIST:  Nadara Mustard, M.D.   CONSULTING PHYSICIANS:  1.  Infectious disease, Cliffton Asters, M.D.  2.  Rehab medicine and pain management. Ranelle Oyster, M.D.   DISCHARGE DIAGNOSES:  1.  Right upper extremity cellulitis.  2.  Pain syndrome.  3.  Anxiety.  4.  Depression  5.  On chronic minocycline for acne.   PROCEDURES:  1.  Upper extremity Doppler ultrasound demonstrated no evidence of DVT or      SVT.  2.  Right upper extremity ultrasound, on January 30, 2005, demonstrated      diffuse inflammation suggestive of cellulitis, although myositis and      fasciitis could not be excluded.  3.  MRI of the right upper extremity, on January 31, 2005, demonstrated      actually less soft tissue edema and enhancement compared to December      4,2006.  There was also a decrease in size of a non-enhancing foci      within the dorsal subcutaneous fat of the forearm when compared to the      study of December 4,2006.  4.  Portable chest x-ray, on January 31, 2005, was ordered for confirmation      of PICC line placement.   LABORATORY DATA:  White blood cell count remained within normal limits  throughout his hospitalization.  On the morning prior to discharge,  creatinine was 0.9.  Liver function tests were within normal limits, except  for mildly elevated AST of 48.  Blood cultures, from January 29, 2005, are  negative.  Wound culture of the right upper extremity was negative.  Stool  for C. diff negative.   HOSPITAL COURSE:  1.  [name] is a 40 year old male  with a previous history of a right      upper extremity cellulitis/abscess that required incision and drainage,      who presented with worsening of the same.  He was having increasing pain      and swelling in this area, despite completing his course of doxycycline      as prescribed.  He was, therefore, admitted for IV antibiotics and      further evaluation.  He was initially started on Zosyn and vancomycin      per pharmacy dosing guidelines.  Dr. Cliffton Asters of infectious disease      was consulted and agreed with continuing vancomycin and Zosyn for the      time being.  An ultrasound performed on admission to evaluate for any      evidence of underlying abscess did not demonstrate such but was  inconclusive regarding presence of myositis or fasciitis.  For that      reason, an MRI was ordered for further evaluation which actually      demonstrated marked improvement since previous MRI earlier in the month.      On December26, 2006, the patient had received six days IV antibiotics      and the soft tissue infection of his right forearm was almost completely      resolved.  There was no residual redness or swelling.  There was some      small slight areas of induration on the dorsum of his right forearm but      otherwise looked well.  Per Dr. Orvan Falconer, the IV antibiotics were      discontinued and he was started on Augmentin and also restarted on his      chronic minocycline which he takes for acne.  He will continue on the      Augmentin for another week and will continue his minocycline chronically      for acne.  2.  Pain syndrome.  The patient continued to complain of a fair amount of      pain, at times seeming out of proportion to findings on exam.  Also, he      would at times ask for pain medications and subsequently fall asleep      before even receiving his p.r.n. dose.  For this reason, a pain      management consult was called and kindly provided by Dr. Faith Rogue      who recommended discontinuing all IV pain medications as well as long-      acting narcotic, namely OxyContin.  He started him on methadone as well      as a small amount of Dilaudid orally as needed for breakthrough pain.      Dr. Riley Kill has agreed to follow [name] as an outpatient with regard      to management of his pain and the patient is aware that he will be      monitored very closely there.  Dr. Riley Kill has given him a prescription      for methadone to be taken 40 mg every 8 hours and he currently has a 2-      week supply.  He was also given a small number of oral Dilaudid to be      used as needed for breakthrough pain with the eventual intent to      discontinue this completely.   DISCHARGE MEDICATIONS:  1.  Augmentin 875 mg p.o. b.i.d. times six more days.  2.  Minocycline 100 mg p.o. b.i.d.  3.  Methadone 40 mg p.o. t.i.d.  4.  Dilaudid 4 mg p.o. q.12h. p.r.n. breakthrough pain.  5.  Effexor 225 mg p.o. daily.  6.  Xanax 2 mg p.o. t.i.d.   DISCHARGE INSTRUCTIONS:  1.  The patient will call Dr. Riley Kill at his office at [phone] to make an      appointment for followup.  2.  He was instructed not to drive while taking pain medications,      specifically not to drive himself home today.      Nelma Rothman, MD  Electronically Signed     RAR/MEDQ  D:  02/06/2005  T:  02/06/2005  Job:  [id number]

## 2010-06-29 NOTE — Assessment & Plan Note (Signed)
[name] is here in follow-up from the hospital.  I saw him for a pain consult.  The patient has a history of chronic low back pain as well as depression and  bipolar disorder, complicated by polysubstance abuse.  We titrated him up to  a 40 mg t.i.d. course of methadone with p.r.n. Dilaudid for breakthrough  pain.  He was able to tolerate the transition off of the Dilaudid although  he does not feel that the methadone, which was prescribed as Methadose,  was not as helpful as the methadone was.  The patient reports his pain as a  4/10 and describes some tightness on the hands and feet with some associated  tingling and numbness.  Right arm pain as related to the cellulitis is  improved a good bit.   MEDICATIONS:  1.  Effexor 225 mg daily.  2.  Methadone 40 mg t.i.d.  3.  Xanax 2 mg t.i.d.  4.  Minocycline 100 mg b.i.d.   Dr. Elsie Saas is managing the patient's psychiatric condition and  medications currently.  The patient has an appointment later this month in  follow-up.   SOCIAL HISTORY:  The patient would like to get back to work.  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  He had been running a [employer]  previously.  He currently lives with his mother.  He is smoking three-  quarter of a pack per day.   REVIEW OF SYSTEMS:  Positive for depression, anxiety, unintentional weight  gain, night sweats.  Other pertinent positives as listed above.   PHYSICAL EXAMINATION:  VITAL SIGNS:  Blood pressure is 125/76, pulse is 104,  respiratory rate 16, he is saturating 98% on room air.  GENERAL:  The patient is pleasant, a bit anxious, but no acute distress.  MUSCULOSKELETAL/NEUROLOGIC:  Coordination and reflexes were normal.  Sensation within normal limits.  Motor function is 5/5.  The patient had  some residual tightness along the right elbow.  He has fairly normal back  range of motion and movement today.  CARDIAC:  Heart was tachycardia.  CHEST:  Clear to auscultation  bilaterally.  ABDOMEN:  Soft, nontender.   ASSESSMENT:  1.  Chronic low back pain.  I am unclear of the extent of his problems.      Apparently he saw Drs. Theodis Shove in the remote past for      management and has had some sort of injections before.  2.  History of polysubstance abuse.  3.  Depression/bipolar disorder.  4.  Tobacco abuse.  5.  Recent cellulitis of the right upper extremity, which required admission      to the hospital.   PLAN:  1.  I will continue with the methadone at the current dosing.  We will make      sure the patient receives actual methadone as opposed to the      [facility].  I stated that if he would like, he could increase his      morning dose to 60 mg while decreasing one of the other two doses to 20      mg, leaving him at the same end dose for the day of 120 mg a day.  2.  We collected a urine sample today.  3.  Would like to obtain records from prior pain management      clinics/orthopedic clinics.  Could consider follow-up MRI/ intervention.  4.  The patient needs regular follow-up with his psychiatrist.  5.  See the patient back in one month's time.  I explained to him the idea      that he will be on a short leash in regard to his pain management      contract with no leeway given, considering his history.  The patient      expressed an understanding and freely volunteered his urine for sampling      today.      Ranelle Oyster, M.D.  Electronically Signed     ZTS/MedQ  D:  02/18/2005 14:50:45  T:  02/19/2005 07:39:04  Job #:  [id number]   cc:   Miachel Roux L. Robb Matar, M.D.

## 2010-06-29 NOTE — Op Note (Signed)
[name], [name]             ACCOUNT NO.:  [id number]   MEDICAL RECORD NO.:  [id number]          PATIENT TYPE:  INP   LOCATION:  5710                         FACILITY:  [facility]   PHYSICIAN:  Abigail Miyamoto, M.D. DATE OF BIRTH:  1970-12-04   DATE OF PROCEDURE:  11/14/2003  DATE OF DISCHARGE:                                 OPERATIVE REPORT   PREOPERATIVE DIAGNOSIS:  Right arm abscess.   POSTOPERATIVE DIAGNOSIS:  Right arm abscess.   OPERATION PERFORMED:  Incision and drainage of right arm abscess.   SURGEON:  Abigail Miyamoto, M.D.   ANESTHESIA:  General endotracheal anesthesia and 1/2 Marcaine.   ESTIMATED BLOOD LOSS:  Minimal.   OPERATION IN DETAIL:  The patient was brought to the operating room and  identified at [name].  He was placed supine on the operating  table and general anesthesia was induced.  His right arm was then prepped  and draped in the usual sterile fashion.  Next, a small transverse incision  was made a skin crease just above the antecubital fossa.  The incision was  carried down through the subcutaneous tissue with electrocautery and to the  large abscess cavity.  There was found to be a uniloculated cavity with a  large amount of purulence.  Cultures were obtained of the purulence.  The  cavity was explored and no evidence of necrotic tissue was identified.   The wound was then irrigated out with a large amount of normal saline.  Hemostasis was then achieved with cautery.  The wound  was then packed with  wet-to-dry saline gauze.  Dry gauze and Kerlix were then placed around this.   The patient tolerated the procedure well.   COUNTS:  All counts were correct at the end of the procedure.   The wound was anesthetized with 1/2 Marcaine prior to placing the packing.  The patient was then extubated in the operating room and taken in stable  condition to the recovery room.       DB/MEDQ  D:  11/14/2003  T:  11/14/2003  Job:  [id number]

## 2010-06-29 NOTE — Assessment & Plan Note (Signed)
[name] is back regarding his chronic low back pain.  We tapered down to 30  mg q.i.d. and [name] is actually doing fairly well with this with  oxycodone for breakthrough pain. He had some difficulties initially but  has reached a steady state.  He is not anxious to reduce any further  today, although he is willing to do so in the near future.  The pain is  described as dull, constant.  He has pain in the low back with  occasional numbness in the legs.  Pain interferes with general activity,  relations with others and enjoyment of life on a moderate level.  His  pain increases with walking and bending.  The patient states he can walk  about 20 minutes at a time.  He is currently not working.  His work has  generally included more manual labors.  He has a degree in agriculture.   REVIEW OF SYSTEMS:  Notable for the above.  His mood has been fair.  He  still has some anxiety.  He is on Xanax for this but has some  difficulties coping.  Full review is in the written health and history  section.   SOCIAL HISTORY:  The patient is divorced and living with family.  Other  pertinent positives per above.   PHYSICAL EXAMINATION:  Blood pressure 130/86, pulse 91, respiratory rate  18, satting 99% on room air.  The patient is pleasant, in no acute  distress.  He is alert and oriented x3.  Affect is generally  appropriate.  Mood is calm.  He has pain with lumbar flexion and  extension once again.  He is limited to about 40 to 50 degrees of  flexion and 10 degrees of extension.  Rotation while low bending causes  discomfort.  He has positive C-slump test and straight-leg raise.  Strength is 5/5.  Reflexes are 2+.   ASSESSMENT:  1. Chronic lumbar spondylosis.  2. History of polysubstance abuse which appears to be under control.  3. History of depression/bipolar disorder.  4. Tobacco abuse.  5. Questionable fibromyalgia.   PLAN:  1. Will stay with the same methadone 30 mg q.i.d., dispense 260 10 mg      tablets.  Goal is to taper further over the next month or two.  2. Continue oxycodone 15 mg q.12 hours breakthrough.  3. Continue to focus on ice, heat, range of motion, core muscle      strengthening and stretching.  4. Made a vocational rehab referral to see if  he can pursue a      sedentary job.  He needs some purpose and goal in his life and      obviously he can no longer pursue the physical labor he has become      accustomed to.  5. I will see the patient back in 2 months.  He will see the nurse      clinic in 1 month.      Ranelle Oyster, M.D.  Electronically Signed     ZTS/MedQ  D:  05/14/2006 11:07:30  T:  05/14/2006 11:44:29  Job #:  [id number]

## 2010-06-29 NOTE — Assessment & Plan Note (Signed)
[name] is back regarding his chronic low back pain.  Things have been fairly  stable since I last saw [name].  Pain has been about 5/10.  He has tried to  stay somewhat active at home.  He and his mother are moving and he has been  working on painting the house.  He has joined some social groups which have  provided [name] some emotional stability.  He reports that his pain is dull,  constant, aching.  It involves most predominantly the low back with some  radiation into the buttock regions.  The pain increases with walking,  bending, sitting and standing.  He still lacks regular stretcher and  exercise program.   REVIEW OF SYSTEMS:  Without significant change today.  He is having less  problems with his skin as he is using a regular rinse and lotion to keep his  acne to a minimum.   SOCIAL HISTORY:  The patient's pertinent positive as listed above.  He  reports taking clean of any illicit drugs.  We did drug test [name] last time  he was here and found no obvious abnormalities.   PHYSICAL EXAMINATION:  Blood pressure is 133/85, pulse is 98, respiratory  rate 16.  He is saturating 100% on room air.  The patient is pleasant, a bit  anxious but stable and actually improved over the last visit.  He is alert  and oriented x3.  Skin is intact today.  The pain appears to be notable in  the PSIS area as well as bilateral facets on the lumbar spine.  Flexion is  approximately 30-40 degrees in extension, 10-15 degrees with pain today.  Strength is 5/5 in all four extremities with 2+ reflexes, normal sensory  function and balance within normal limits.  He has some mild abdominal  obesity.   ASSESSMENT:  1. Chronic low back pain/chronic pain syndrome.  2. Polysubstance abuse.  3. Depression/bipolar disorder.  4. Tobacco abuse.  5. Questionable fibromyalgia.   PLAN:  1. I have no plans on changing methadone at this point.  Continue 40 mg      q.i.d.  2. Oxycodone 5 mg q.12h. p.r.n. #30.  3. Discussed  at length today of proper stretching and range of      motion/exercise program which he needs to be serious about.  He has      nothing else to do but focus on this at this point.  4. We have discussed over-the-counter supplements at last visit.  5. We will see the patient back in three-months' time.  He will see the      nurses' clinic in one month.      Ranelle Oyster, M.D.  Electronically Signed     ZTS/MedQ  D:  11/01/2005 11:08:26  T:  11/04/2005 01:33:42  Job #:  [id number]

## 2010-06-29 NOTE — H&P (Signed)
NAMELAVANTE, TOSO NO.:  [id number]   MEDICAL RECORD NO.:  [id number]          PATIENT TYPE:  INP   LOCATION:  3040                         FACILITY:  [facility]   PHYSICIAN:  Lonia Blood, M.D.DATE OF BIRTH:  09-May-1970   DATE OF ADMISSION:  01/14/2005  DATE OF DISCHARGE:                                HISTORY & PHYSICAL   CHIEF COMPLAINT:  Fell onto a nail with the right forearm.   HISTORY OF PRESENT ILLNESS:  [name] is a 40 year old gentleman  with the past medical history detailed below.  He has been in his usual  state of health recently without significant acute problems.  Approximately  2 days prior to his presentation to the emergency room, the patient was  walking around in his backyard at night and tripped.  He fell onto his right  forearm.  When he stood up, he noticed that he had a small piece of wood  stuck to his right arm.  Further investigation revealed that there was a  long bent wire that was stuck in the piece of wood and then embedded in  his arm.  He removed it himself and reported there was no immediate  bleeding.  The patient woke up the next morning with having significant pain  in the right forearm.  He had hydromorphone left over from a previous thumb  injury and began dosing himself with this p.o. at home.  For 24 hours, his  pain was well controlled with hydromorphone.  Then, the patient ran out of  his hydromorphone.  He also appreciated significant swelling in the arm.  He  has not lost sensation in the fingers.  He has maintained full use of the  fingers.  There has been no significant exudate from the entry wound, and  the entry wound itself is now almost impossible to find.  The patient has  had no fevers or chills.  He has been kept awake because of pain, and his  primary focus is on severe pain in the right forearm.   REVIEW OF SYSTEMS:  Comprehensive review of systems is unremarkable, with  the exception of  the positive elements noted in the history of present  illness above.   PAST MEDICAL HISTORY:  1.  Anxiety disorder.  2.  Chronic severe depression.  3.  History of heroin addiction, on methadone via [facility], with daily dose being      administered through that clinic.  4.  History of  stomach ulcer per old records.  No further details      available.  5.  Known diverticulosis.  6.  Tobacco use in the manner of 1/2 pack per day since teen years.  7.  Acne, on chronic minocycline therapy.   OUTPATIENT MEDICATIONS:  1.  Effexor 225 mg ER daily.  2.  Xanax 2 mg t.i.d.  3.  Minocycline 100 mg b.i.d.  4.  Methadone 50 mg daily.   ALLERGIES:  CODEINE.   FAMILY HISTORY:  The patient's mother and father are both alive and healthy,  with no significant chronic medical problems per  the patient.   SOCIAL HISTORY:  The patient lives in [place].  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  He is single.  He has a 97-year-old daughter.  He  does not drink alcohol and does not use illicit drugs at this time, per his  history.   DATA REVIEW:  X-ray of the right forearm reveals soft tissue swelling but no  bony abnormalities.  No evidence of osteomyelitis.  No laboratory values  have been obtained by the ER physician at the time of my evaluation.   PHYSICAL EXAMINATION:  VITAL SIGNS:  Temperature 99.1; blood pressure  132/77; heart rate 117; respiratory rate 20; O2 saturation 96% on room air.  GENERAL:  Well-developed, well-nourished male in no acute respiratory  distress, who is grimacing and does in fact appear to be in pain.  HEENT:  Normocephalic, atraumatic.  Pupils equal, round, and reactive to  light and accommodation and not constricted.  OC/OP clear.  LUNGS:  Clear to auscultation bilaterally without wheezes or rhonchi.  CARDIOVASCULAR:  Regular rate and rhythm, without murmurs, gallops, or rubs.  Normal S1, S2.  ABDOMEN:  Nontender, nondistended, soft.  Bowel sounds present.   No  hepatosplenomegaly, no rebound, no ascites.  EXTREMITIES:  No significant cyanosis, clubbing, or edema of bilateral lower  extremities.  NEUROLOGIC:  5/5 strength in bilateral upper and lower extremities.  Intact  sensation touch throughout.  No Babinski.  Cranial nerves II-XII intact  bilaterally.  MUSCULOSKELETAL/CUTANEOUS:  Close inspection of the patient's right forearm  does not reveal any evidence of a puncture wound.  There is an aspect on the  medial portion of the patient's right forearm that is a bit indurated.  Overall, the entire forearm is significantly swollen when compared to the  left forearm.  There is severe pain with any manipulation of the forearm.  The patient does have intact radial pulse in the right arm.  He is able to  move his fingers without difficulty.  He is able to appreciate sensation of  light touch and pressure throughout all digits of his right hand.  There is  no significant erythema.  There is no purulent discharge.  Swelling does not  extend beyond the elbow.   IMPRESSION AND PLAN:  1.  Significant right forearm cellulitis.  [name] is suffering with a      severe right forearm cellulitis/soft tissue injury due to the puncture      wound from the stiff wire/nail-like object from the fall in his yard.  I      will place the patient on empiric IV antibiotics.  Of more concern is      the swelling and the possibility for developing a compartment syndrome      in this area.  Presently, the patient does have pain, but he is not      pulseless, and he does not have paresthesia or paralysis.  I will admit      the patient for observation in the hospital.  We will elevate his right      arm.  He will be dosed with pain medication in an appropriate dose for a      patient who is on chronic methadone.  We will follow vascular exams of      the right arm on a frequent basis.  Should there be any evidence of loss     of pulse or paralysis, hand surgery  will be consulted immediately for  consideration of fasciotomy.  I will check a CK at this time as a      possible early warning for myositis which could lead to full-blown      compartment syndrome.  Electrolytes will also be obtained to include      calcium, magnesium, phosphorus, and potassium.  2.  Severe pain.  The patient does indeed have a significant degree of pain      associated with his right forearm pain.  Though the patient has a      history, on review of old records, of requesting pain medications and      making multiple visits to the ER for such, he does in fact have a clear      indication for severe pain at this time with the pronounced amount of      swelling in his right forearm.  I will dose the patient with Dilaudid      IV.  I will give him a higher dose with ranges of 4-8 mg q.8 h. because      of his long-term narcotic dependence and probable tolerance to      medications as a result.  Phenergan will be used for a synergistic      effect as needed.  We will hold narcotics if there is any evidence of      respiratory depression.  3.  Chronic narcotic dependence, on methadone therapy.  The exact need for      the patient's methadone therapy is not clear.  Old records indicate that      he is being treated for heroin addiction.  The patient himself states      that he is being treated for narcotic prescription addiction.      Nonetheless, we will continue methadone at 50 mg daily.  When the      patient is discharged, he should not be given a prescription for      methadone, as methadone is being administered on a daily basis at [facility].  4.  Tobacco abuse.  We will take advantage of the patient's hospitalization      to counsel him on the need to discontinue tobacco abuse.  Smoking      cessation consultation has been requested.  5.  Depression with anxiety disorder.  We will continue the patient's      Effexor and Xanax as per his confirmed outpatient  doses.      Lonia Blood, M.D.  Electronically Signed     JTM/MEDQ  D:  01/14/2005  T:  01/14/2005  Job:  [id number]   cc:   [place], M.D.  [facility]

## 2010-06-29 NOTE — Discharge Summary (Signed)
[name], [name]             ACCOUNT NO.:  [id number]   MEDICAL RECORD NO.:  [id number]          PATIENT TYPE:  INP   LOCATION:  3040                         FACILITY:  [facility]   PHYSICIAN:  Mobolaji B. Bakare, M.D.DATE OF BIRTH:  [birth year]   DATE OF ADMISSION:  01/14/2005  DATE OF DISCHARGE:  01/19/2005                                 DISCHARGE SUMMARY   PRIMARY CARE PHYSICIAN:  The patient is unassigned.   FINAL DIAGNOSES:  1.  Cellulitis/hematoma over proximal one third of posterior surface of the      right forearm.  2.  Probable thrombophlebitis, medial surface, right forearm.  3.  Opiate dependence on outpatient methadone program.  4.  History of polysubstance abuse.  5.  Chronic severe depression.  6.  Anxiety disorder.  7.  Acne on minocycline therapy.  8.  Tobacco abuse.  9.  History of diverticulosis.   CONSULTS:  1.  Orthopedic consult, Nadara Mustard, M.D.  2.  Psychiatric consult.   PROCEDURES:  X-ray of the right forearm showed soft tissue swelling without  evidence of acute bony abnormality.  An MRI of the right forearm showed  diffuse cellulitis of the forearm and distal upper arm with a ill-defined  subcutaneous abscess dorsal to the proximal ulna.  There was concern for  intermuscular edema, worrisome for edema, cellulitis, and fasciitis.  There  was no deep abscess noted and no evidence of osteomyelitis.   BRIEF HISTORY:  Mr. [name] is a 40 year old Caucasian male with history of  polysubstance abuse and opiate dependence.  He is currently in a methadone  program.  About three days prior to presentation, he stated he tripped in  his back yard, and he sustained injury to the posterior surface of the right  forearm.  Possibly, he was struck by a splinter of wood.  Ever since, he has  developed pain and swelling.  There was no fever.  On evaluation, he had  significant swelling over the posterior surface of the right forearm.  There  was no involvement  of his digits and no loss of sensation.  The swelling  does not extend beyond the elbow proximally.  There was no leukocytosis on  admission.  Initial temperature was 99.   HOSPITAL COURSE:  [name] was hospitalized and started empirically on  antibiotics with vancomycin for cellulitis.  Orthopedic consult was  obtained, and the patient had aspiration of an area of swelling which  revealed hematoma.  This was sent for culture and so far, there was no  organism seen, though there were abundant white blood cells and no growth on  culture.  He has MRI of the right forearm which showed that his cellulitis  per report as noted above.  The patient's vancomycin was discontinued when  aspirate culture came back negative, and the antibiotic was changed to  doxycycline. He would continue on this for five more days.   Pain was initially controlled with PCA, and the patient was transitioned to  Dilaudid p.r.n. and to continue with methadone.   The patient was followed up closely by the orthopedic  surgeon, and it was  felt unnecessary to pursue incision and drainage since there was remarkable  improvement in the swelling after aspiration.   The patient was evaluated by Dr. Jeanie Sewer during this hospitalization  because of agitation and hallucinations.  However, the patient denied all  these symptoms.  He was felt to have a broad and appropriate affect and  judgment.  His memory was intact.  There was no changes made to his  medications.   DISCHARGE MEDICATIONS:  1.  Effexor take 2-5 mg p.o. daily.  2.  Xanax 2 mg p.o. t.i.d.  3.  Doxycycline 100 mg p.o. b.i.d. to complete five more days.  On      completion, the patient will restart minocycline 100 mg b.i.d.  4.  Methadone 50 mg daily.  5.  Hydromorphone 4 mg p.o. q.4 h. p.r.n. for pain.  A prescription was      given for 20.   FOLLOW UP:  The patient should follow up at [facility] and to continue with  his methadone program.    PERTINENT LABORATORY DATA:  Sodium 143, potassium 4.3, chloride 109, CO2 28,  glucose 86, BUN 11, creatinine 0.9, calcium 8.9, white count 7.7, hemoglobin  13, hematocrit 37.5, platelets 248,000, MCV 88.  Wound culture (aspirate  from the right forearm) showed abundant white blood cells with  polymorphonuclear cells, no organisms seen.  Culture negative.  CPK 128.      Mobolaji B. Corky Downs, M.D.  Electronically Signed     MBB/MEDQ  D:  01/19/2005  T:  01/19/2005  Job:  [id number]   cc:   Pershing Proud

## 2010-06-29 NOTE — Consult Note (Signed)
[name], [name]             ACCOUNT NO.:  [id number]   MEDICAL RECORD NO.:  [id number]          PATIENT TYPE:  INP   LOCATION:  3041                         FACILITY:  [facility]   PHYSICIAN:  Ollen Gross. Vernell Morgans, M.D. DATE OF BIRTH:  05-28-1970   DATE OF CONSULTATION:  07/28/2005  DATE OF DISCHARGE:                                   CONSULTATION   [name] is a 40 year old white male who has a history of recurrent  abscesses from a methicillin-sensitive Staphylococcus aureus.  Her presents  today with a painful area on his right forearm and left neck.  The right  forearm area was drained previously and operated on, I believe by Dr. Lajoyce Corners.  The left neck lesion is new over the last several days.  Both areas,  especially the neck area, are very tender.  He has not run any fevers.  He  does have chronic pain problems and polysubstance abuse.   PAST MEDICAL HISTORY:  Significant for:  1.  Chronic low back pain.  2.  Chronic pain syndrome.  3.  Kidney stones.  4.  Diverticulosis.  5.  Polysubstance abuse.  6.  Depression.  7.  Bipolar disorder.  8.  Spondylosis of his lumbar spine.  9.  Peptic ulcer disease.   PAST SURGICAL HISTORY:  Significant for I&D of his right forearm.   MEDICATIONS:  Include methadone, Effexor, Xanax, Geodon, Seroquel, MiraLax.   ALLERGIES:  HYDROCODONE and CODEINE.   SOCIAL HISTORY:  He has a history of polysubstance abuse and smokes about 3  cigarettes a day.   FAMILY HISTORY:  Significant for hypertension in his mother, coronary artery  disease in his father.   PHYSICAL EXAMINATION:  VITAL SIGNS: Temperature 98.6, pulse 68, blood  pressure 113/68.  GENERAL:  He is a well-developed, well-nourished white male in no acute  distress.  SKIN:  Warm and dry, no jaundice.  HEENT:  Eyes and extraocular muscles intact.  Pupils equal, round, and  reactive to light. Sclerae nonicteric.  LUNGS: Clear bilaterally.  No use of accessory muscles.  HEART:   Regular rate and rhythm with impulse in left chest.  ABDOMEN: Soft, and nontender. No palpable mass or hepatosplenomegaly.  EXTREMITIES: No clubbing, cyanosis, or edema with good strength in his arms  and legs.  PSYCHOLOGIC: He is very anxious and seems to be in pain.  NECK:  On examination of his left neck, he has an area about 2-3 cm of  firmness.  There is only some slight redness to the skin in this area.  The  area is very tender.  There is no palpable fluctuance.   ASSESSMENT AND PLAN:  This is a 39 year old white male with a painful,  indurated area in his left base of the neck supraclavicular area.  It is not  clear at this point if this is from an abscess, although he has normal white  count and no fever.  Given the area of this tenderness, I would be  uncomfortable operating in this area at the base of the neck and behind the  clavicle.  I would  recommend either CT or ultrasound to further evaluate  this area to see if it looks like an abscess as well as to assess the size  and extent of this.  I would agree with broad-spectrum antibiotic coverage  and, if surgery is necessary, he may need something like an ENT or thoracic  surgery consult.   Thanks.      Ollen Gross. Vernell Morgans, M.D.  Electronically Signed     PST/MEDQ  D:  07/28/2005  T:  07/29/2005  Job:  [id number]

## 2010-06-29 NOTE — H&P (Signed)
[name], [name]             ACCOUNT NO.:  [id number]   MEDICAL RECORD NO.:  [id number]          PATIENT TYPE:  EMS   LOCATION:  ED                           FACILITY:  [facility]   PHYSICIAN:  Jonna L. Robb Matar, M.D.DATE OF BIRTH:  [birth year]   DATE OF ADMISSION:  01/30/2005  DATE OF DISCHARGE:                                HISTORY & PHYSICAL   PRIMARY CARE PHYSICIAN:  Dr. Theodoro Grist.   ORTHOPEDIST:  Dr. Lajoyce Corners.   CHIEF COMPLAINT:  Right arm is swelling and hurting again.   HISTORY OF PRESENT ILLNESS:  The patient was just here from December 4  through 9 with cellulitis and had an abscess drained, and was sent home on  Doxycycline but it started to swell and hurt again and patient was sent back  over here by Dr. Darrick Penna. This apparently started when he fell on a piece of  wood but of interest is that he was hospitalized in October of 2005 with a  very similar story. 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   PAST MEDICAL HISTORY:  1.  Anxiety and depression.  2.  Heroin addiction on Methadone for [facility].  3.  History of peptic ulcer disease.  4.  Acne on Minocycline.  5.  History of renal calculi.   FAMILY HISTORY:  Parents are alive and well.   SOCIAL HISTORY:  Half a pack a day. 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 Has been on Methadone maintenance.   ALLERGIES:  HYDROCODONE, EFFEXOR.  Dictation ended here.      Jonna L. Robb Matar, M.D.  Electronically Signed     JLB/MEDQ  D:  01/30/2005  T:  01/30/2005  Job:  [id number]

## 2010-06-29 NOTE — Consult Note (Signed)
[name], [name]             ACCOUNT NO.:  [id number]   MEDICAL RECORD NO.:  [id number]          PATIENT TYPE:  INP   LOCATION:  5710                         FACILITY:  [facility]   PHYSICIAN:  Allen Kell, M.D.   DATE OF BIRTH:  [birth year]   DATE OF CONSULTATION:  11/16/2003  DATE OF DISCHARGE:                                   CONSULTATION   REQUESTED BY:  Duncan Dull, M.D.   IMPRESSION:  1.  Right arm cellulitis, on doxy and Ancef, due to a blood draw about a      month ago.  2.  Methadone withdrawal.  The patient has symptoms of methadone withdrawal      including vein pain, excruciating back pain and chills.  He is on      methadone according to the patient for pain although he receives it once      a day.  3.  Generalized pain which is due to #1 and 2.  4.  Anxiety disorder.  The patient is on Klonopin at home at 2 mg t.i.d.      which he tells me he takes most of the time.  Occasionally, he takes 2      mg b.i.d. and saves an extra one for a day when he is more stressed.  5.  Addictive personality.  I definitely agree with this.  He has a positive      family history of this, and the patient himself acknowledges addiction      in himself.   At this time I recommend:  1.  Restart the Klonopin 2 mg p.o. t.i.d.  2.  Consider Trazodone 25-50 mg p.o. h.s. for sleep.  3.  Tylenol 650 mg p.o. q.4h for methadone bone pain.  4.  If you are going to treat breakthrough pain or this new level of pain, I      think that he will need 10-20 mg of morphine q.30-60 minutes until      comfortable and then take that total dose and administer q.4h p.r.n.      breakthrough pain.  For example, you can give 10 IV q.30-60 minutes.  If      he requires three doses for comfort, gets 30 mg IV q.4h p.r.n.      breakthrough pain.  5.  Advance methadone every two days by 10 mg to 90 mg p.o. if you are going      to start at the 45 mg as recommended by [facility].  6.  I do believe that the patient  would have pain.  I spoke with Dr. Margo Aye      earlier today and we both agreed that given his injury that he have pain      and that he might need pain medicines in addition to his baseline      methadone.   HISTORY OF PRESENT ILLNESS:  This 40 year old white male has a long history  of substance use and abuse.  The patient has been on methadone for about a  year because he tells me that he became addicted to OxyContin  after he  sustained a fall at work.  The patient was a landscaper and about 3 years  ago, I believe, he fell off a tree and was admitted to [facility].  At  that time, they put him on OxyContin and he became dependent on it.  He  recognized this addictive behavior and sought out [facility].  There they put him  on 90 mg of methadone, titrated him up to that dose, and he feels that he  has been relatively stable ever since.  He has given up his landscaping  business since and has a failed relationship with his significant other and  a child as a result.  The patient was seen at [facility] about a month ago  and received lab work according to the patient, and since that time he  developed an abscess in his right arm.  He did actually see a physician for  this and at the time that the patient left the office he was given Demerol,  Dilaudid and Percocet for pain.  The patient tells me has used heroine in  the past, but it was only since he fell off the tree and needed that for  pain control.  He has dabbled in cocaine in the past, but has not used this  in years.  Alcohol is no longer a drug of choice.  His last heroine use was  about a year ago, according to the patient.   PAST MEDICAL HISTORY:  The patient is allergic to HYDROCODONE.  I do not  know what the allergy is.   MEDICATIONS:  Klonopin 10 t.i.d.   He does see Dr. Betti Cruz, a psychiatrist, for management of a general anxiety  disorder.  He has a history of kidney stones and history of diverticulitis.   PHYSICAL  EXAMINATION:  GENERAL:  The patient is in moderate to severe  distress, moaning and crying in pain. Complaining of severe excruciating  pain in his legs and back, and does complain of some pain in his arm.  LUNGS:  Clear.  HEART:  Regular rate and rhythm.  HEENT:  He has significant grimacing on his face.  ABDOMEN:  Benign.  EXTREMITIES:  He could move his legs, but it was certainly an effort to do  so and seemed to cause him great distress.  His right arm was bandaged and  he could not flex it 180 degrees.  SKIN:  Clammy and sweaty, but warm.   Laboratories were remarkable for a mild anemia, normal white count.  Of  note, his temperature was 103 when he came in.  He did have positive benzo  in his urine as well as opiates and he was on the Dilaudid upon discharge  from his recent hospital evaluation.  Radiology revealed an intramuscular  and subcutaneous abscess of the right upper arm with adjacent cellulitis.   Thank you very much for asking [place] to perform this consultation.  We will be  glad to follow this patient along and help you with some pain management  issues.       RT/MEDQ  D:  11/17/2003  T:  11/17/2003  Job:  [id number]

## 2010-06-29 NOTE — Discharge Summary (Signed)
NAMERAYMOND, BHARDWAJ             ACCOUNT NO.:  [id number]   MEDICAL RECORD NO.:  [id number]          PATIENT TYPE:  INP   LOCATION:  5710                         FACILITY:  [facility]   PHYSICIAN:  Catalina Pizza, M.D.        DATE OF BIRTH:  11-08-1970   DATE OF ADMISSION:  11/13/2003  DATE OF DISCHARGE:  11/19/2003                                 DISCHARGE SUMMARY   DISCHARGE DIAGNOSES:  1.  Right biceps abscess and cellulitis.  2.  Anxiety and depression.  3.  Anemia.  4.  History of heroin use on methadone from [facility].  5.  Decompression of LD with generalized pain syndrome.   DISCHARGE MEDICATIONS:  1.  Doxycycline 100 mg b.i.d. x1 week.  2.  Iron sulfate 325 mg b.i.d.  3.  Celexa 40 mg p.o. daily.  4.  Methadone 90 mg daily.  5.  Clonazepam 2 mg three times a day.  6.  Trazodone 50 mg q.h.s.  7.  MiraLax 17 g daily.  8.  Nizoral 2% cream apply to face two times daily.  9.  MSIR 30 mg q.4 h. p.r.n. for pain.   BRIEF HISTORY OF PRESENT ILLNESS:  [name] is a 40 year old white male  with minimal past medical history who was apparently seen by [facility] one  month ago for routine blood work and started having right arm swelling and  warmth.  He returned and was given doxycycline for 15 days with muscle  relaxers which did improve.  One day prior to admission to the hospital, he  had worsening pain and had a fever of 103.  He was admitted to the hospital  for further evaluation and workup.   LABORATORY DATA ON ADMISSION:  White count of 8.8, hemoglobin 11.7,  hematocrit 34, platelet count 280,000.  Sodium was 135, potassium 4.1,  chloride 101, CO2 27, glucose 95, BUN 11, creatinine 0.8, calcium 8.4.  Total protein 6.8, albumin 2.9.  AST 29, ALT 22, alkaline phosphatase 87,  total bilirubin 0.2.  Other laboratory work obtained during hospitalization  and upon discharge showed total iron of 15, a TIBC of 190  percent  saturation of 8, B12 449, RBC folate of 593, and ferritin of  171.  Acute  hepatitis panel was negative.  HIV was nonreactive.  UDS was positive for  benzodiazepines and positive for opiates.  Blood cultures:  One was negative  and one was positive for Staphylococcus aureus which was only resistant to  penicillin.  Wound culture was also positive for Staphylococcus aureus which  is as well resistant to only penicillin.  Anaerobic cultures were negative.  Discharge laboratories:  White count was 6.9, hemoglobin 11.2, hematocrit  32.2, platelet count 368,000.  Routine chemistries were normal.   SCANS OBTAINED DURING HOSPITALIZATION:  CT of the right arm with contrast  showed a 9 x 4.6 x 2.7-cm intramuscular abscess in his right biceps.  Right  elbow with four views showed no other abnormality, no foreign body seen.   HOSPITAL COURSE:  Problem 1.  Right arm abscess.  Upon CT scan, a consult to  Dr. Magnus Ivan for surgery of abscess.  The patient was taken, and I&D of the  abscess was done.  The patient was packed with gauze and Kerlix and was  assessed throughout the hospitalization by Dr. Magnus Ivan.  He was started on  Ancef upon admission and was continued on this until further results were  obtained.  He was continued on IV antibiotics until started on p.o.  doxycycline to continue for a 14-day course.  He was given instructions by  surgery for care and followup for this wound.   Problem 2.  Pain management, anxiety, and depression.  Mr. Wernette  throughout the hospitalization was very difficult to treat from a pain  perspective, and a consult was obtained from Dr. Allen Kell who works  with palliative care.  Mr. Stayer was not very forthcoming with his past  medical history which included the fact that he was followed by [facility] and was  currently getting methadone.  He failed to mention this for two days of his  hospitalization and seemed to be keeping this a secret.  When approached  with this, he stated that I needed to talk with [facility] for further  treatment  possibilities. He was initially started on multiple pain medications which  did not adequately treat his pain.  Per Dr. Norris Cross recommendations, he was  restarted on his anti-anxiety medicines, Klonopin as well as trazodone for  sleep and started back on his methadone dosage at a lower level.  In  discussion with the surgeon, it was felt that his pain was not indicative of  the surgery that he had, and should not require the quantity of pain  medicine.  Given the fact that he was on methadone, his sensitization of  pain medicine and the tolerance for the pain medicine was very high,  throughout the hospitalization, it was very difficult to attain a good pain  control for him.  Upon discharge, the patient was to follow up with the [facility] since some of the pain medicines need to be monitored by them, given  the fact that he is also on methadone.   Problem 3.  Anemia.  Throughout the hospitalization, the patient had a  stable hemoglobin, and it was apparent that he did have some mild iron  deficiency.  He did have a history of a stomach ulcer before which was  treated and diverticulosis which was stable, unclear whether this was  contributing to his iron deficiency, but the patient will need further  workup as an outpatient for this.   DISCHARGE INSTRUCTIONS:  He was given the above-mentioned medicines, and the  patient was to remain in the hospital over the weekend but was discharged  over the weekend by cross cover due to the patient's willingness to go home  and follow up with both [facility], [facility], and the surgeon.       ZH/MEDQ  D:  02/16/2004  T:  02/16/2004  Job:  [id number]

## 2010-06-29 NOTE — H&P (Signed)
[name], [name] NO.:  [id number]   MEDICAL RECORD NO.:  [id number]          PATIENT TYPE:  INP   LOCATION:  3041                         FACILITY:  [facility]   PHYSICIAN:  Hillery Aldo, M.D.   DATE OF BIRTH:  1970/05/17   DATE OF ADMISSION:  07/27/2005  DATE OF DISCHARGE:                                HISTORY & PHYSICAL   PRIMARY CARE PHYSICIAN:  Dr. Theodoro Grist.   CHIEF COMPLAINTS:  Painful lesion to the left neck and right forearm.   HISTORY OF PRESENT ILLNESS:  The patient is a 40 year old male with past  medical history of recurrent cellulitis and abscess.  He has cultures dating  back to October 2005 that grew methicillin-sensitive staph aureus.  At the  same time, he had one of two positive blood cultures also positive for MSSA.  He now presents with an indurated lesion to the left neck and shortly after  he developed this, developed swelling of the right forearm in the area where  he has had previous bouts of cellulitis.  He reports that this has gotten  progressively worse over the past several weeks and when he developed a low  grade fever, he decided to come in for evaluation.   PAST MEDICAL HISTORY:  1.  Chronic lower back pain and generalized pain syndrome followed by Dr.      Thersa [facility].  2  Acne.  1.  History of renal calculi.  2.  History of diverticulosis.  3.  History of polysubstance abuse.  4.  Depression and bipolar disorder.  5.  Spondylosis of the lumbar spine  6.  History of peptic ulcer disease.   FAMILY HISTORY:  No family history of skin problems or cellulitis.  Mother  has hypertension.  Father has coronary artery disease.   SOCIAL HISTORY:  The patient is divorced.  Lives with his parents.  He has  worked as a Administrator in the past.  He is not sexually active currently.  His last HIV test was done in December 2006 and was reportedly negative.  He  denies that he has been sexually active since that time.   He is trying to  quit tobacco and is down to three cigarettes a day with use of a nicotine  patch.   DRUG ALLERGIES:  HYDROCODONE and Codeine both cause nausea and pruritus.   CURRENT MEDICATIONS:  1.  Methadone 40 mg q.i.d.  2.  Effexor 150 mg daily.  3.  Xanax 2 mg t.i.d.  4.  Geodon 40 mg daily.  5.  Seroquel 150 mg q.h.s.  6.  MiraLax 17 grams daily.  7.  Fiber supplements as needed.   REVIEW OF SYSTEMS:  The patient reports that he suffers with chronic  constipation, though lately he has had some diarrhea.  No melena or  hematochezia.  He has had some intermittent nausea but no vomiting.  He has  had low-grade fever.  Denies any chest pain, shortness of breath or cough.  Reports that he has had a racing heart at times.   PHYSICAL EXAM:  VITAL  SIGNS:  Temperature 98.6, pulse 68, respirations 20,  blood pressure 113/68, O2 saturation 98% on room air.  GENERAL:  Well-developed, well-nourished male in no acute distress.  HEENT:  Normocephalic, atraumatic.  PERRL.  EOMI.  Oropharynx clear.  NECK:  Supple.  No thyromegaly, no lymphadenopathy.  He does have an  indurated, erythematous lesion approximately 1 cm in diameter to the left  cervical/anterior chest area.  CHEST:  Lungs clear to auscultation bilaterally with good air movement.  HEART:  Regular rate, rhythm.  No murmurs, rubs, gallops.  ABDOMEN:  Soft, nontender, nondistended with normoactive bowel sounds.  EXTREMITIES:  No clubbing, edema, cyanosis.  He has a large indurated area  of swelling but no erythema to his right forearm.  There is a small area  that is slightly discolored and looks like a possible blood blister.  It is  indurated and firm without fluctuance.  NEUROLOGIC:  The patient is alert and oriented x3.  Cranial nerves II-XII  grossly intact.  Nonfocal.  SKIN:  The patient has seborrheic dermatitis to the facial area.   DATA REVIEW:  CBC shows a white blood cell count 8.3, hemoglobin 14.6,  hematocrit  41.8, platelet count 303 with an absolute neutrophil count 4.3.  Sodium is 142, potassium 4.7, chloride 105, bicarb 30, BUN 13, creatinine  0.9, glucose 84.  LFTs are within normal limits.   ASSESSMENT/PLAN:  1.  Recurrent cellulitis:  The patient's current cellulitis is worrisome for      community-acquired methicillin-resistant Staphylococcus positive aureus      versus the reactivation of methicillin-sensitive Staphylococcus aureus.      I will empirically treat him with vancomycin for now.  He may need a      surgical consultation for incision and drainage.  before we do this,      however, I will obtain an MRI scan of his forearm to evaluate the      extensiveness of the infection.  I will also culture his bilateral nares      to see if he is a carrier for staph aureus.  2.  Chronic pain:  We will continue the patient's usual maintenance      medications and add oxycodone 5-10 mg for moderate to severe pain and      Dilaudid IV for severe pain.  3.  Tobacco abuse:  We will counsel on tobacco cessation and continue with a      nicotine patch to help with cessation efforts.  4.  Chronic constipation:  Continue MiraLax and add Senokot.  We will use      Dulcolax suppositories and Fleet's enemas as needed.  5.  Seborrheic dermatitis:  We will use low potency steroid topically.      Would avoid systemic steroid given his current infection.  6.  Prophylaxis:  Initiate gastrointestinal prophylaxis with proton pump      inhibitor.  He is ambulatory so I will not use any deep vein thrombosis      prophylaxis.     Hillery Aldo, M.D.    CR/MEDQ  D:  07/28/2005  T:  07/29/2005  Job:  [id number]   cc:   Theodoro Grist, M.D.

## 2010-06-29 NOTE — Assessment & Plan Note (Signed)
PATIENT:  [name]   DATE OF BIRTH:  November 08, 1970   DATE OF VISIT:  May 15, 2005   REASON FOR VISIT:  [name] is back regarding [name] low back pain and generalized  pain syndrome.  The methadone helps somewhat with [name] pain.  He noted some  continued exacerbation particularly when he is active.  [name] back will lock  up and he also gets some shooting pain in the neck between scapulae.  The  patient rates [name] pain overall at a 4-5/10.  He had done some research on  fibromyalgia and questions as to whether he may have this problem.  Overall,  [name] reports [name] pain as sharp, stabbing, constant and aching and interferes  with general activity and activities of daily living on a moderate level.  Sleep is fair to poor.   MEDICATIONS:  1.  Methadone 40 mg four times a day.  2.  Effexor 225 mg daily.  3.  Xanax 2 mg t.i.d.   REVIEW OF SYSTEMS:  The patient reports some constipation, diarrhea, urinary  retention, abdominal pain, poor appetite.  He is using a stool softener and  laxative with some results.  Full review of systems is in the Health and  History section of the chart.   SOCIAL HISTORY:  The patient is divorced living with [name] mother.  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   PHYSICAL EXAMINATION:  VITAL SIGNS:  Blood pressure 123/72, pulse 76,  respirations 16, saturations 99% on room air.  GENERAL:  The patient is pleasant in no acute distress.  NEUROLOGIC:  He is alert and oriented x3.  Affect is appropriate.  He is a  bit anxious, but under control today.  Gait is stable.  I examined the back  and he was tender to palpation of the lumbar paraspinal and facets.  He had  some flattening of the lordotic curve.  He is able to flex to about 50-60  degrees with some tension.  Extension caused some discomfort.  Facet  maneuvers caused significant pain bilaterally.  He also has had pain with  twisting.  Motor function was 5/5 in both upper and lower  extremities.  Sensation is normal.  Reflexes are 2+.  HEART:  Regular rate and rhythm.  LUNGS:  Clear.  ABDOMEN:  Soft, nontender.  NECK:  Range of motion was fair.   ASSESSMENT:  1.  Chronic low back pain/chronic pain syndrome.  2.  Polysubstance abuse.  3.  Depression/bipolar.  4.  Tobacco abuse.  5.  Questionable fibromyalgia.   PLAN:  1.  I am not convinced that the patient has fibromyalgia.  I think most of      [name] pain is centered around [name] cervical lumbar regions.  He has      untreated spondylosis of the lumbar spine.  Essentially were masking      symptoms with [name] methadone which he has built up a tolerance to.  He is      hampered somewhat by [name] financial constraints at this point.  I did      make some recommendations for some over-the-counter treatments for [name]      back and leg pain including glucosamine, Omega-3 fatty acids and DHEA      for muscle strength and endurance.  He will look into these.  2.  I refilled [name] methadone 40 mg four times a day.  Will not check urine  today.  3.  I gave the patient 5 mg oxycodone tablets, #20, for severe breakthrough      pain.  4.  The patient to continue with [name] bowel program.  5.  The patient will be seen in the clinic in 1 month's time.  I will see      him back in 3 months.      [name], M.D.  Electronically Signed     ZTS/MedQ  D:  05/15/2005 12:05:16  T:  05/16/2005 10:52:09  Job #:  [id number]

## 2010-06-29 NOTE — Assessment & Plan Note (Signed)
[name] is back with an acute increase in his low back pain.  He states  that he was helping to build a deck on his home, and the pain has been  increased since then.  He has been to the hospital twice for treatment.  He states that he has had no imaging further.  He was given pain  medication while in the hospital and was worked up with labs, but  nothing further was determined.  The patient rates his pain as a 6/10.  It is described as stabbing, constant, aching.  The pain interferes with  general activity, relationships with others, enjoyment of life on a  moderate level.  Sleep is poor.  He remains on methadone 40 mg q.i.d.  and oxycodone 5 mg one q.12h. p.r.n.  The pain is mostly in the back  with some radiation down the right leg, although this is inconsistent.   REVIEW OF SYSTEMS:  The patient reports depression, anxiety, skin rash  that still has not had a definable source.   SOCIAL HISTORY:  The patient is divorced and lives with his mother.  He  smokes a half pack of cigarettes per day.   PHYSICAL EXAMINATION:  VITAL SIGNS:  Blood pressure is 122/71, pulse is  100, respiratory rate is 17.  He is satting 96% on room air.  GENERAL:  The patient is pleasant, in no acute distress.  He is alert  and oriented x3.  NEUROLOGIC/MUSCULOSKELETAL:  Gait is limping, favoring the right side  today. H He has difficulty standing straight, and when palpated in the  lumbar spine, he had severe pain, and it was difficult to really  appreciate his anatomy with palpation due to his discomfort and lack of  tolerance.  Flexion was minimal today.  Extension caused pain.  He has  positive straight leg raising and seated slump tests today on either  leg.  Strength appeared to be generally preserved.  Reflexes were 2+ at  the knee and ankles bilaterally.   ASSESSMENT:  1. Chronic lumbar spondylosis with new radicular features.  2. History of polysubstance abuse.  3. History of depression/bipolar  disorder.  4. Tobacco abuse.  5. Questionable fibromyalgia.   PLAN:  1. Gave the patient prednisone, first starting a 60 mg and titrating      to off over approximately 15 days' time.  2. Refilled oxycodone and gave him 45 as opposed to 30 for      breakthrough pain.  Continue methadone at the same dose, 40 mg      q.i.d., #120.  3. Encouraged to schedule naproxen 1-2 q.12h., as well as Tylenol.  I      gave him sample of Skelaxin, which are muscle relaxers.  4. Encouraged heat, ice, range of motion, etc.  5. Will see him back in about 3 months' time.  He will see the nurses      in 1 month.  He is to call if he has any further problems.      Ranelle Oyster, M.D.  Electronically Signed     ZTS/MedQ  D:  01/22/2006 13:29:28  T:  01/22/2006 14:06:34  Job #:  [id number]

## 2010-06-29 NOTE — Assessment & Plan Note (Signed)
[name] is back regarding his chronic low back pain.  He was admitted to the  hospital on July 30, 2005, with cellulitis for which he received IV  antibiotics.  Apparently his methadone was titrated up to 50 mg q.i.d. with  adjustment of his breakthrough medicine of Dilaudid.  He states this has  helped him a great deal.  All I see on his discharge summary is methadone 40  mg four times a day.  There is no extra dosing of methadone nor Dilaudid  written.  He does have prescriptions from [name] for the medications.  There may have been some discrepancy there.  He is on doxycycline for  prophylaxis and treatment of the skin.  He states that his back pain has  been better with the medication.  The patient's Effexor is consistent at 150  mg a day.  He is on Xanax 2 mg three times a day.  He is off the Geodon and  using Seroquel at bedtime.  The patient's has concerns about having  fibromyalgia syndrome.  We discussed this at length today.  Right now he  reports his pain is a 2 out of 10.  He has been active.  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  He is not  routinely performing structured exercises, however.   REVIEW OF SYSTEMS:  The patient reports no new neurological, psychiatric,  constitutional, GU, GI, cardiorespiratory symptoms.   SOCIAL HISTORY:  It is interesting to note that on a palliative care  consult, performed June 20, by Asencion Noble, Nurse Practitioner, the  patient reports use of cocaine still at times.  We have tested him on  multiple occasions without a positive test up to this point.  I did not  question the patient about this statement today, however.   PHYSICAL EXAMINATION:  VITAL SIGNS:  Blood pressure is 131/68, pulse is 88,  respiratory rate is 16, sating 96% on room air.  GENERAL:  The patient is pleasant, a bit anxious but no acute distress.  He  is alert and oriented x3.  SKIN:  Stable, appears to have improved  somewhat.  MUSCULOSKELETAL:  His low back is painful slightly to palpation at the PSI  area  and bilateral facets.  He has decreased flexion/extension overall but  this appears stable to in fact to improved from prior visits.  He has 5/5  strength in all 4 extremities.  Reflexes are 2+.  Balance is good.  ABDOMEN:  He still remains obese abdominally.   ASSESSMENT:  1.  Chronic low back pain/chronic pain syndrome.  2.  Polysubstance abuse.  3.  Depression/bipolar disorder.  4.  Tobacco abuse.  5.  Questionable fibromyalgia.   PLAN:  1.  Continue with current methadone dosing at 40 mg q.i.d. and oxycodone for      breakthrough pain. I did give him #30 oxycodone for breakthrough      symptoms today.  2.  I do not want to continue escalating his pain medications up as he      continues to seek escalations on every subsequent visit.  He needs to      work on other modalities which we discussed at last visit and again      today including supplementation, stretching, healthy diet, appetite,      sleep, proper bed, shoes, etcetera.  3.  I gave him a list of potential supplementation suggestions today      including  glucosamine, omega-3 fatty acids, DHEA, Coenzyme Q,      multivitamin, etcetera.  4.  We checked a urine sample today.  5.  I will have the patient follow up at the [facility] in one month's      time.      Ranelle Oyster, M.D.  Electronically Signed     ZTS/MedQ  D:  08/06/2005 16:15:18  T:  08/06/2005 21:17:26  Job #:  [id number]

## 2010-06-29 NOTE — Assessment & Plan Note (Signed)
[name] is back regarding his chronic back pain.  We had to switch him over  to the 10 mg methadone tablets due to the changes in policy banning the  40 mg wafers.  The patient's pain is a 4/10 today.  He does not know if  his pain control has been as good on the 10 mg tablets now.  He states  that pain is generally aching and throbbing.  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  He has done  better with the shorter routes and rest breaks between the routes.  The  patient rates the pain as a 4/10 and states that it interferes with  general activity, relations of others and enjoyment of life on a  moderate level.  He uses 5 mg of oxycodone q.12 hours p.r.n.   REVIEW OF SYSTEMS:  The patient reports some sweating and decreased  appetite due to anxiety.  He also uses Xanax for appetite reasons to  allow him to relax enough to eat his meals.  Full review is in the  health and history section.   SOCIAL HISTORY:  The patient is divorced and living with family still.   PHYSICAL EXAMINATION:  VITAL SIGNS:  Blood pressure is 142/91, pulse is  91, respiratory rate 16, he is sating 100% on room air.  GENERAL:  The patient is pleasant and in no acute distress.  He is  slightly diaphoretic.  He is a bit anxious but this is the norm for him.  He continues to have pain with lumbar flexion and extension.  He seems  to be doing better, however, with overall movement there today.  Straight leg raising was positive as was seated slump test today.  Strength is 5/5.  Reflexes are 2+.   ASSESSMENT:  1. Chronic lumbar spondylosis.  2. History of polysubstance abuse.  3. History of depression/bipolar disorder.  4. Tobacco abuse.  5. Questionable fibromyalgia.   PLAN:  1. We will taper methadone down to 30 mg q.i.d. #360 of the 10 mg      tablets.  2. Increase oxycodone to 15 mg q.12 hours  p.r.n. breakthrough.  3. We would like to convert to another agent such as MS Contin or      Duragesic patch financially permitting.  4. [name] will attempt to decrease methadone further over the next week      or two pain permitting.  5. Any other modalities including ice, heat and range of motion, etc.  6. Will see the patient back in about 1 month's time.      Ranelle Oyster, M.D.  Electronically Signed     ZTS/MedQ  D:  04/15/2006 10:44:54  T:  04/15/2006 12:11:03  Job #:  [id number]

## 2010-06-29 NOTE — Discharge Summary (Signed)
[name], [name]             ACCOUNT NO.:  [id number]   MEDICAL RECORD NO.:  [id number]          PATIENT TYPE:  INP   LOCATION:  3041                         FACILITY:  [facility]   PHYSICIAN:  Jonna L. Robb Matar, M.D.DATE OF BIRTH:  May 07, 1970   DATE OF ADMISSION:  07/28/2005  DATE OF DISCHARGE:  08/01/2005                                 DISCHARGE SUMMARY   PRIMARY CARE PHYSICIAN:  Dr. Theodoro Grist.   PAIN MANAGEMENT:  Dr. Riley Kill.   CONSULTANTS:  1.  Palliative care.  2.  [facility].  3.  Dr. Jeanie Sewer for psychiatric.  4.  Dermatology, Dr. Mathews Robinsons.   FINAL DIAGNOSES:  1.  Methicillin-sensitive Staphylococcus aureus cellulitis of the left      supraclavicular area and small abscess on the right forearm.  2.  Addiction to pain medication.  3.  Cocaine use.  4.  Chronic low back pain.  5.  Depression and bipolar disorder.  6.  Spondylosis of the lumbar spine.   HISTORY:  This 40 year old white male has recurrent cellulitis and some  abscesses for the last 2 years; previously they have grown out MSSA.  He  developed an indurated lesion in the left neck and swelling of the right  forearm where he has had previous episodes of cellulitis.  The patient also  notes that he has had chronic infections on the skin and back, which he  called acne.  The patient has multiple other problems including chronic  lower back pain for which he is followed by Dr. Riley Kill at the pain clinic,  polysubstance abuse, depression, etc.   HOSPITAL COURSE:  The patient was started on vancomycin for his cellulitis.  Workup with CT scanning showed edema without focal collection.  An  ultrasound right lower lateral rib fractures and a tiny abscess in the right  forearm without any abscess in the left supraclavicular area.  Dr. Roxan Hockey  suggested IV vancomycin followed by 2 weeks of doxycycline.  Surgical  consult did not feel that it warranted surgery.  Pain management, palliative  care consult did increase the patient's methadone to 50 mg q.i.d.  However,  we ran into issues with the patient trying to drop his meds in his lap and  hoard them and that would happen twice in a row.  Another time he was fast  asleep and unarousable on a couple of occasions from the methadone plus  Dilaudid.  He slept through one methadone dose and then was very  argumentative at that time to get another.   DISPOSITION:  The patient is discharged home on his original methadone 40 mg  q.i.d., Effexor 150 daily, Xanax 2 mg t.i.d., Geodon 40 daily, Seroquel 150  at bedtime, MiraLax 17 daily, Senokot S at bedtime, clindamycin gel apply  to his back and arms once a day, Hibiclens daily wash and doxycycline 100 mg  b.i.d. for 2 full weeks.  He is to return to see Dr. Riley Kill for further  adjustments in his pain control as needed and to see Dr. Darrick Penna in 2 weeks  if needed.  Jonna L. Robb Matar, M.D.  Electronically Signed     JLB/MEDQ  D:  08/01/2005  T:  08/01/2005  Job:  [id number]

## 2010-06-29 NOTE — Consult Note (Signed)
[name], [name]             ACCOUNT NO.:  [id number]   MEDICAL RECORD NO.:  [id number]          PATIENT TYPE:  INP   LOCATION:  3041                         FACILITY:  [facility]   PHYSICIAN:  Clois Comber. Margo Aye, MD       DATE OF BIRTH:  April 04, 1970   DATE OF CONSULTATION:  07/30/2005  DATE OF DISCHARGE:                                   CONSULTATION   REFERRING PHYSICIAN:  Dr. Lavera Guise   REASON FOR CONSULTATION:  To assist with pain management.  Consult performed  by Berkley Harvey, nurse practitioner for Dr. Ferman Hamming.   HISTORY OF PRESENT ILLNESS:  [name] is an unfortunate 40 year old  Caucasian male with a complex past medical history of recurrent cellulitis  and abscess as well as chronic polysubstance abuse, bipolar disorder, and  needing chronic pain management in the pain clinic under Dr. Riley Kill.  He is  admitted to have treatment for Staph aureus and his abscesses with IV  antibiotics.  I was asked to see the patient to assist with pain management  on this admission.   [name] reports that his pain is chronic in his back, knees, feet,  hands, and neck.  He states that the pain is worse in the morning and is  typically a 9 out of 10.  He states with activity it will worsen and his  pain will exacerbate to a 20 out of 10.  He has chronically been on several  different medications under the supervision of Dr. Riley Kill; however, he is  currently taking methadone 40 mg at home with Percocet for breakthrough.  He  is only allowed 20 Percocet per month.  The patient reports that his current  acute pain is located in bilateral shoulders as well as the areas of  abscess.  He states that he feels like he just cannot move and he cannot get  out of bed.   The nurses state that they have been attempting to watch him take his  medications; however, on two attempt he has tried to drop the pill in the  bed and not take it while they are in the room.  The patient reports that  over the  last few months he has become increasingly deconditioned and he  cannot hold a job anymore.  He lives at home with his mother.  He is  divorced.  He lost his job and he feels like his life is out of control.  He  states that he is very hopeful to get his pain under control and to get back  to Dr. Riley Kill' office as well as get counseling and find out exactly why he  is continuing to get these abscesses so that he can attempt to get well.   IMPRESSION:  This N.P. spoke with Dr. Riley Kill' office R.N. who follows him  chronically for pain and spoke with the patient and assessed him for one  hour.  It appears that the patient has a chronic pain syndrome exacerbated  by psychiatric illness and acute infection/abscess.  We will not be able to  fully treat all  of his pain and he understands this.  Dr. Riley Kill' office  agreed that titrating up the methadone and discontinuing IV Dilaudid will be  beneficial.  I agree.  He also could possibly have systemic underlying  disease process that might be leading to these infections.  This is a very  complex case that I will continue to follow and assist with management in  the hospital.  The patient is very fragile and needs direction as well as  support and our team will continue to follow him.   RECOMMENDATIONS:  1.  Increase methadone to 50 mg q.i.d.  Please give medications at 6 a.m.,      12 noon, 6 p.m., and 12 a.m.  2.  Tylenol 650 mg t.i.d.  3.  Discontinue Dilaudid IV.  4.  Dilaudid 6 mg q.3h. p.r.n. pain.   PAST MEDICAL HISTORY:  As above in HPI.   FAMILY HISTORY:  The patient has no family history of skin problems or  cellulitis.  He has no history of polysubstance abuse as well.   SOCIAL HISTORY:  The patient is divorced.  He lives with his parents.  He  worked as a Administrator in the past.  He states that he is not currently  sexually active.  His last HIV test was done in [facility] 2006 and was  reportedly negative.  He denies being  sexually active since that time.  He  still continues to smoke and use cocaine at times; however, he reports that  he does not use them as much as he used to.   DRUG ALLERGIES:  HYDROCODONE and CODEINE both cause nausea and pruritus.   MEDICATIONS:  1.  Xanax 2 mg p.o. t.i.d.  2.  Hydrocortisone by topically t.i.d.  3.  Methadone 80 mg p.o. every 8:00, 40 mg p.o. every 2:00, 40 mg p.o. every      6:00.  4.  Nicotine patch.  5.  Protonix 40 mg p.o. daily.  6.  Miralax 17 g p.o. daily.  7.  Seroquel 150 mg p.o. q.h.s.  8.  Senokot one tablet p.o. q.h.s.  9.  Vancomycin 1000 mg IV q.8h.  10. Effexor 150 mg p.o. daily.  11. Geodon 40 mg p.o. q.h.s.   REVIEW OF SYSTEMS:  As above in HPI.   PHYSICAL EXAMINATION:  VITAL SIGNS:  Temperature 98.4, pulse 68,  respirations 18, blood pressure 128/60, O2 saturation 98% on room air.  GENERAL:  Well-developed, well-nourished male in no acute distress.  HEENT:  Head normocephalic, atraumatic.  Skin pale.  Eyes PERLA.  Oropharynx  clear.  NECK:  Supple.  No lymphadenopathy.  No thyromegaly.  He does have an  indurated, erythematous lesion approximately 7cm in diameter to the left of  the cervical anterior chest area.  CHEST:  Lungs clear to auscultation bilaterally.  HEART:  Heart rate regular.  No murmurs, rubs, or gallops.  ABDOMEN:  Soft, nontender.  EXTREMITIES:  No clubbing, edema, or cyanosis.  He does have an indurated  area of swelling, but no erythema on his right forearm.  NEUROLOGIC:  The patient is alert and oriented x3.  Communicates needs  effectively.  Follows all commands.  SKIN:  The patient has seborrheic  dermatitis to the facial area.   LABORATORIES:  Include white blood cell count 8.7, hemoglobin 12.9,  hematocrit 36.7.  Sodium 139, BUN 11, creatinine 1, glucose 108.  Patient  tested positive to benzodiazepines, cocaine, and opiates.      Bettey Mare.  Jean Rosenthal, NP      Clois Comber. Margo Aye, MD Electronically Signed     KMJ/MEDQ  D:  07/31/2005  T:  07/31/2005  Job:  [id number]   cc:   Miachel Roux L. Robb Matar, M.D.

## 2010-06-29 NOTE — H&P (Signed)
[name], [name]             ACCOUNT NO.:  [id number]   MEDICAL RECORD NO.:  [id number]          PATIENT TYPE:  EMS   LOCATION:  ED                           FACILITY:  [facility]   PHYSICIAN:  Jonna L. Robb Matar, M.D.DATE OF BIRTH:  [birth year]   DATE OF ADMISSION:  01/30/2005  DATE OF DISCHARGE:                                HISTORY & PHYSICAL   CONTINUATION OF HISTORY AND PHYSICAL   ALLERGIES:  HYDROCODONE.   MEDICATIONS:  1.  Effexor ER 225 mg daily.  2.  Minocin 100 b.i.d.  3.  Methadone 50 mg daily.   REVIEW OF SYSTEMS:  Other than his arm, it was pretty much negative.   PHYSICAL EXAMINATION:  VITAL SIGNS:  Temperature 97.6, pulse 97,  respirations 20, blood pressure 140/87.  GENERAL:  Anxious, over talkative white male.  HEENT:  Pupils are constricted. Extraocular movements are full. Normal  hearing, mucosa and pharynx.  NECK:  Shows no mass, thyromegaly or carotid bruits.  LUNGS:  Clear to A&P without wheezing, rales, rhonchi, dullness.  HEART:  Regular rate and rhythm, normal S1 and S2 without murmurs, rubs, or  gallops.  EXTREMITIES:  Pulses are without bruits. No clubbing, cyanosis or edema.  ABDOMEN:  Nontender, no hepatosplenomegaly or hernias.  GU:  Normal external genitalia. No inguinal adenopathy.  NEURO:  Muscle strength is 5/5 all four extremities. Cranial nerves are  intact. Deep tendon reflexes are 2+. Sensation is intact. The patient is  alert and oriented x3. Normal memory. Brings a lot of records with him. Very  anxious. Very concerned with getting a Dilaudid dose big enough to help his  pain. Left upper extremity is normal. He has multiple scabs on his body like  he has been picking at his acne. The right upper extremity has several  indurated area with no spreading erythematous overt cellulitis. There  generally seems to be some edema along the bottom of the arm and there is a  small area that got drained.   LABORATORY WORK:  Is all pending.   IMPRESSION:  PROBLEM 1: Previous cellulitis in the right upper extremity. I  am not sure at this point whether he had some kind of a low-grade smoldering  bacteria, whether there is a small piece of foreign body that is keeping  this going, whether this is just a lot of chronic changes and induration  that is just not clearing but it might be worth while at this time to do a  full court press we will try to get this resolved. I am going to consult  orthopedics, infectious disease, and psychiatry if necessary. Per infectious  disease, we are going to start vancomycin and Zosyn tonight pending would  culture results.  PROBLEM 2: Methadone maintenance for heroin addiction.  PROBLEM 3:  Acne.  PROBLEM 4:  Anxiety and depression.      Jonna L. Robb Matar, M.D.  Electronically Signed     JLB/MEDQ  D:  01/30/2005  T:  01/30/2005  Job:  [id number]

## 2010-11-06 LAB — DIFFERENTIAL
Basophils Absolute: 0
Lymphocytes Relative: 32
Monocytes Absolute: 0.6
Monocytes Relative: 6
Neutro Abs: 5.3
Neutrophils Relative %: 60

## 2010-11-06 LAB — BASIC METABOLIC PANEL
Calcium: 9.3
GFR calc Af Amer: >60
GFR calc non Af Amer: >60
Sodium: 138

## 2010-11-06 LAB — CBC
Hemoglobin: 15.5
RBC: 4.9

## 2010-11-09 LAB — URINALYSIS, ROUTINE W REFLEX MICROSCOPIC
Glucose, UA: NEGATIVE
Ketones, ur: NEGATIVE
Nitrite: NEGATIVE
Specific Gravity, Urine: 1.031 — ABNORMAL HIGH
pH: 6

## 2010-11-09 LAB — CBC
HCT: 42.4
Hemoglobin: 14.8
MCHC: 34.8
RBC: 4.83
RDW: 13.7

## 2010-11-09 LAB — DIFFERENTIAL
Basophils Absolute: 0
Eosinophils Relative: 1
Lymphocytes Relative: 13
Monocytes Absolute: 0.6
Monocytes Relative: 6

## 2010-11-09 LAB — URINE MICROSCOPIC-ADD ON

## 2010-11-09 LAB — POCT I-STAT, CHEM 8
HCT: 43
Hemoglobin: 14.6
Potassium: 3.2 — ABNORMAL LOW
Sodium: 138
TCO2: 23

## 2011-03-18 ENCOUNTER — Ambulatory Visit: Payer: Self-pay | Admitting: Internal Medicine

## 2011-03-18 VITALS — BP 122/82 | HR 80 | Temp 97.1°F | Resp 18 | Ht 69.5 in | Wt 236.0 lb

## 2011-03-18 DIAGNOSIS — M545 Low back pain: Secondary | ICD-10-CM

## 2011-03-18 DIAGNOSIS — F419 Anxiety disorder, unspecified: Secondary | ICD-10-CM

## 2011-03-18 DIAGNOSIS — F411 Generalized anxiety disorder: Secondary | ICD-10-CM

## 2011-03-18 DIAGNOSIS — M549 Dorsalgia, unspecified: Secondary | ICD-10-CM

## 2011-03-18 NOTE — Progress Notes (Addendum)
  Subjective:    Patient ID: [name], male    DOB: 04-28-1970, 41 y.o.   MRN: [id number]  HPIpain for 20 years Methadone failing    Review of Systemssee below     Objective:   Physical Exam See below       Assessment & Plan:   Subjective:    [name] is a 41 y.o. male who presents for evaluation of low back pain. The patient has had chronic back pain which failed to respond to treatments/p fracture?? and eventually treated with methadone. Symptoms have been present for several years and are unchanged.  Onset was related to / precipitated by a remote injury. The pain is located in the across the lower back and radiates to the right lower leg, left lower leg. The pain is described as aching, burning, pinching, stabbing and stiffness and occurs all day.  Symptoms are exacerbated by exercise, extension, flexion, lying down, running and standing. Symptoms are improved by nothing. He has also tried muscle relaxants, narcotic pain medications, NSAIDs and rest which provided no symptom relief. He has weakness in the right leg, weakness in the left leg, tingling in the right leg, tingling in the left leg, burning pain in the right leg and burning pain in the left leg associated with the back pain. He made a decision to get off meds as raising the dose of methadone did no good. He has withdrawn from celexa, is down to 37mg  of methadone, and is still on alprazolam. Troubled with temperature control. Doesn't even drive any more. 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    Review of Systems Pertinent items are noted in HPI.  raised in alcoholic household.   Objective:   no change from past    Assessment:    Chronic back pain  GAD Past drug abuse in recovery Past depression   Plan:    PT referral. handouts   Studies not ordered at this point.  He would like chronic pain treatment  at a [facility], but has no insurance and can't afford Pain Management at this point. HE IS WELL INTENTIONED TO TRY TO GET OFF METHADONE, but I see no clear alternatives at this point and explained that I am unwilling and unqualified  to prescribe chronic pain meds for him.   Several alternatives were discussed. He has anxiety meds for the next 3-4 months and will return after that.

## 2011-10-08 ENCOUNTER — Other Ambulatory Visit: Payer: Self-pay | Admitting: Internal Medicine

## 2011-10-08 NOTE — Telephone Encounter (Signed)
Dr. Merla Riches - Pt did not fill the last script for xanax -  He is trying to wean off the medication.  Please refill call at new home / cell number [phone]

## 2011-10-09 ENCOUNTER — Telehealth: Payer: Self-pay

## 2011-10-09 NOTE — Telephone Encounter (Signed)
Current outpatient prescriptions:ALPRAZolam (XANAX) 1 MG tablet, Take 2 mg by mouth 3 (three) times daily., Disp: , Rfl: ;  carisoprodol (SOMA) 350 MG tablet, Take 350 mg by mouth as needed., Disp: , Rfl: ;  methadone (DOLOPHINE) 10 MG/ML solution, Pt takes 37mg  QAM., Disp: , Rfl: ;  oxyCODONE-acetaminophen (PERCOCET) 7.5-325 MG per tablet, Take 1 tablet by mouth as needed., Disp: , Rfl:   since no refills listed on RXand no quanity listed we need to know from pharmacy last date of rx and how many dispensed

## 2011-10-09 NOTE — Telephone Encounter (Signed)
Pt of Dr. Merla Riches.  Prescription given was not refilled soma  and alprazolam.  [name] waited too long to refill.  Pharmacy faxed 2 days ago, request for refill.   Not completed yet.[name] [facility] [phone].  Pt phone number is [phone].

## 2011-10-09 NOTE — Telephone Encounter (Signed)
Dr. Merla Riches,  Please advise--looks like patient would need to come in for an office visit.Marland KitchenMarland Kitchen

## 2011-10-10 ENCOUNTER — Telehealth: Payer: Self-pay

## 2011-10-10 NOTE — Telephone Encounter (Signed)
Pharmacy reported that pt last got Soma #120 on 03/13/11 w/sig take one QID, and Alpralozam 1 mg #180 on 03/07/11, take 1-2 tabs TID. Can we check DEA report for Dr Merla Riches and route back to him, please?

## 2011-10-10 NOTE — Telephone Encounter (Signed)
Pt is calling to check on the status of a refill request   Please call [phone]

## 2011-10-10 NOTE — Telephone Encounter (Signed)
I printed a database and have placed it in Dr. Merla Riches box for review

## 2011-10-11 MED ORDER — ALPRAZOLAM 1 MG PO TABS: 2.0000 mg | ORAL_TABLET | Freq: Three times a day (TID) | ORAL | Status: DC

## 2011-10-11 NOTE — Telephone Encounter (Signed)
Dr Merla Riches, the DEA is in your box and agrees w/pharmacy's report that pt has not gotten Soma since 03/13/11 #120. Ryan had RFd his Xanax for 1 mos but does not write for [employer]. Can you please address this when you return tomorrow - pt is aware that you will not be back until then and is fine w/waiting for his Soma RF.

## 2011-10-11 NOTE — Telephone Encounter (Signed)
See notes under phone message 8/28 and also "refill" message from 8/27.

## 2011-10-11 NOTE — Telephone Encounter (Signed)
I will refill for one month, but will have to come in before this runs out.

## 2011-10-11 NOTE — Telephone Encounter (Signed)
See my other message

## 2011-10-11 NOTE — Telephone Encounter (Signed)
Pt states that he is not able to come in to have this medication refilled due to him not having the money at this time. Pt would really like for xanax to be filled as soon as possible. Best# [phone]

## 2011-10-11 NOTE — Telephone Encounter (Signed)
Notified pt that xanax was RFd x 1 mos but then he will need an OV for add'l. Asked pt about his Tresa Garter (see previous phone message waiting for Dr Doolittle's OK). Pt stated he can wait until Dr Merla Riches reviews Tresa Garter, he can be w/out that easier than the xanax which he has some withdrawal from if he goes w/out. Dr Merla Riches, forwarding this FYI when reviewing other phone mes.

## 2011-10-11 NOTE — Telephone Encounter (Signed)
If the pharmacy as correct and he is only gotten his medicines one time since he was here last and he is truly making progress and trying to wean himself off these medications. I will not be in until tomorrow, and if the report had her put my box shows no prescriptions from other providers that would be okay with me for him to have a refill of both Xanax and soma as I wrote him last at his February office visit.

## 2011-10-13 MED ORDER — CARISOPRODOL 350 MG PO TABS: 350.0000 mg | ORAL_TABLET | Freq: Three times a day (TID) | ORAL | Status: DC | PRN

## 2011-10-13 NOTE — Addendum Note (Signed)
Addended by: Tonye Pearson on: 10/13/2011 11:42 PM   Modules accepted: Orders

## 2011-10-13 NOTE — Telephone Encounter (Signed)
Soma written tonight/sent to pharmacy #[id number] one 3 times a day when necessary

## 2011-10-14 NOTE — Telephone Encounter (Signed)
Patient has called back and was advised.  

## 2011-10-14 NOTE — Telephone Encounter (Signed)
I have called patient to advise this was sent in for him, he must be having phone trouble. He said hello x3 while I was talking. Will try to call back later.

## 2011-11-02 ENCOUNTER — Ambulatory Visit: Payer: Self-pay | Admitting: Internal Medicine

## 2011-11-02 VITALS — BP 102/70 | HR 100 | Temp 97.5°F | Resp 18 | Ht 69.5 in | Wt 228.0 lb

## 2011-11-02 DIAGNOSIS — L219 Seborrheic dermatitis, unspecified: Secondary | ICD-10-CM

## 2011-11-02 DIAGNOSIS — F411 Generalized anxiety disorder: Secondary | ICD-10-CM

## 2011-11-02 DIAGNOSIS — M549 Dorsalgia, unspecified: Secondary | ICD-10-CM

## 2011-11-02 DIAGNOSIS — G8929 Other chronic pain: Secondary | ICD-10-CM | POA: Insufficient documentation

## 2011-11-02 MED ORDER — CARISOPRODOL 350 MG PO TABS: 350.0000 mg | ORAL_TABLET | Freq: Three times a day (TID) | ORAL | Status: DC | PRN

## 2011-11-02 MED ORDER — CITALOPRAM HYDROBROMIDE 40 MG PO TABS: 40.0000 mg | ORAL_TABLET | Freq: Every day | ORAL | Status: DC

## 2011-11-02 MED ORDER — ALPRAZOLAM 2 MG PO TABS: 2.0000 mg | ORAL_TABLET | Freq: Three times a day (TID) | ORAL | Status: DC | PRN

## 2011-11-02 MED ORDER — AMITRIPTYLINE HCL 150 MG PO TABS: 150.0000 mg | ORAL_TABLET | Freq: Every day | ORAL | Status: DC

## 2011-11-02 MED ORDER — KETOCONAZOLE 2 % EX CREA: TOPICAL_CREAM | Freq: Every day | CUTANEOUS | Status: DC

## 2011-11-02 NOTE — Patient Instructions (Addendum)
1/2 tab celexa for 1st week

## 2011-11-02 NOTE — Progress Notes (Signed)
  Subjective:    Patient ID: [name], male    DOB: 01-31-1971, 41 y.o.   MRN: [id number]  HPI Followup 1. GAD (generalized anxiety disorder)   2. Chronic back pain   His just getting back on her regular medications Recent jail for few months due to calling ex-wife/custody case for 67 yo pending Lost his job/lost his place to stay/money gone Getting back on feet-Living with mother in [place] Used her Xanax until he could get refills from [place] So he had to replenish her supply with his last prescription and is 9 days early for this prescription  Is now off of opiates/methadone per Dr. Hermelinda Medicus at rehabilitation Low back pain managed by exercises and Tylenol at this point along with soma,Amitriptyline at bedtime  Anxiety responding to 2 mg of Xanax 3 times a day plus Celexa-he has been off of this for one month and needs to restart Review of Systems    No new symptoms Objective:   Physical Exam  No acute distress Vital signs stable There is redness and scaliness of the eyebrows and around the nose down to his beard    Assessment & Plan:   1. GAD (generalized anxiety disorder)   2. Chronic back pain   3   Seborrheic dermatitis  Meds ordered this encounter  Medications  . DISCONTD: amitriptyline (ELAVIL) 150 MG tablet    Sig: Take 150 mg by mouth at bedtime.  Marland Kitchen DISCONTD: citalopram (CELEXA) 40 MG tablet    Sig: Take 40 mg by mouth daily.  Marland Kitchen ALPRAZolam (XANAX) 2 MG tablet    Sig: Take 1 tablet (2 mg total) by mouth 3 (three) times daily as needed for sleep.    Dispense:  90 tablet    Refill:  5  . citalopram (CELEXA) 40 MG tablet    Sig: Take 1 tablet (40 mg total) by mouth daily.    Dispense:  90 tablet    Refill:  3  . amitriptyline (ELAVIL) 150 MG tablet    Sig: Take 1 tablet (150 mg total) by mouth at bedtime.    Dispense:  90 tablet    Refill:  3  . carisoprodol (SOMA) 350 MG tablet    Sig: Take 1 tablet (350 mg total) by mouth 3 (three) times daily as  needed.    Dispense:  90 tablet    Refill:  5  . ketoconazole (NIZORAL) 2 % cream    Sig: Apply topically daily.    Dispense:  15 g    Refill:  2            To use baby shampoo as well   Followup six-month

## 2012-04-08 ENCOUNTER — Ambulatory Visit (INDEPENDENT_AMBULATORY_CARE_PROVIDER_SITE_OTHER): Payer: Self-pay | Admitting: Internal Medicine

## 2012-04-08 ENCOUNTER — Encounter: Payer: Self-pay | Admitting: Internal Medicine

## 2012-04-08 ENCOUNTER — Telehealth: Payer: Self-pay | Admitting: *Deleted

## 2012-04-08 VITALS — BP 136/96 | HR 94 | Temp 98.5°F | Resp 16 | Ht 71.0 in | Wt 216.0 lb

## 2012-04-08 DIAGNOSIS — G8929 Other chronic pain: Secondary | ICD-10-CM

## 2012-04-08 DIAGNOSIS — L739 Follicular disorder, unspecified: Secondary | ICD-10-CM

## 2012-04-08 DIAGNOSIS — G4700 Insomnia, unspecified: Secondary | ICD-10-CM

## 2012-04-08 DIAGNOSIS — F411 Generalized anxiety disorder: Secondary | ICD-10-CM

## 2012-04-08 DIAGNOSIS — F1911 Other psychoactive substance abuse, in remission: Secondary | ICD-10-CM | POA: Insufficient documentation

## 2012-04-08 MED ORDER — ALPRAZOLAM 2 MG PO TABS: 2.0000 mg | ORAL_TABLET | Freq: Three times a day (TID) | ORAL | Status: DC | PRN

## 2012-04-08 MED ORDER — DOXYCYCLINE HYCLATE 100 MG PO TABS: 100.0000 mg | ORAL_TABLET | Freq: Two times a day (BID) | ORAL | Status: DC

## 2012-04-08 MED ORDER — ALPRAZOLAM 2 MG PO TABS
2.0000 mg | ORAL_TABLET | Freq: Three times a day (TID) | ORAL | Status: DC | PRN
Start: 2012-04-08 — End: 2012-04-08

## 2012-04-08 MED ORDER — TRAZODONE HCL 100 MG PO TABS: 100.0000 mg | ORAL_TABLET | Freq: Every evening | ORAL | Status: DC | PRN

## 2012-04-08 MED ORDER — AMITRIPTYLINE HCL 150 MG PO TABS: 150.0000 mg | ORAL_TABLET | Freq: Every day | ORAL | Status: DC

## 2012-04-08 NOTE — Telephone Encounter (Signed)
Called pharmacy spoke to [place] to cancel prescription for amitriptyline, per Dr Merla Riches.

## 2012-04-10 ENCOUNTER — Encounter: Payer: Self-pay | Admitting: Internal Medicine

## 2012-04-10 NOTE — Progress Notes (Signed)
Patient Active Problem List  Diagnosis  . GAD (generalized anxiety disorder)  . Chronic back pain  . Insomnia  . Substance abuse in remission   Doing very well New job/[employer] firm anx better  Continues off meth Still needs xanax Insomnia tends to be a major problem-he has a long history of insomnia dating to childhood which may or may not be related to his anxiety He now uses 150 mg of amitriptyline at bedtime and has for the last several months without any positive benefit He wakes frequently throughout the night No history of sleep apnea/no daytime hypersomnolence Complaining of rash on back over the past few weeks/months  Exam BP 136/96  Pulse 94  Temp(Src) 98.5 F (36.9 C) (Oral)  Resp 16  Ht 5\' 11"  (1.803 m)  Wt 216 lb (97.977 kg)  BMI 30.14 kg/m2  SpO2 98% No lymphadenopathy Back with pustular follicular lesions all over   Impression Same problems/we'll try to address insomnia more specifically Folliculitis back   Plan Meds ordered this encounter  Medications  . DISCONTD: amitriptyline (ELAVIL) 150 MG tablet    Sig: Take 1 tablet (150 mg total) by mouth at bedtime.    Dispense:  90 tablet    Refill:  3  . traZODone (DESYREL) 100 MG tablet    Sig: Take 1 tablet (100 mg total) by mouth at bedtime as needed for sleep.    Dispense:  90 tablet    Refill:  3  . doxycycline (VIBRA-TABS) 100 MG tablet    Sig: Take 1 tablet (100 mg total) by mouth 2 (two) times daily. For skin infection    Dispense:  40 tablet    Refill:  0  2 call if trazodone is not effective Followup 6 months

## 2012-05-01 ENCOUNTER — Telehealth: Payer: Self-pay

## 2012-05-01 DIAGNOSIS — L739 Follicular disorder, unspecified: Secondary | ICD-10-CM

## 2012-05-01 NOTE — Telephone Encounter (Signed)
Referral done

## 2012-05-01 NOTE — Telephone Encounter (Signed)
PATIENT WOULD LIKE REFERRAL TO DERMATOLGY IF POSSIBLE FOR HIS SKIN CONDITION

## 2012-05-01 NOTE — Telephone Encounter (Signed)
Called patient to advise. No answer at either number

## 2012-05-26 ENCOUNTER — Telehealth: Payer: Self-pay

## 2012-05-26 DIAGNOSIS — R21 Rash and other nonspecific skin eruption: Secondary | ICD-10-CM

## 2012-05-26 NOTE — Telephone Encounter (Signed)
Pt is needing to get a referral to a dermatologist   Best number [phone]

## 2012-05-26 NOTE — Telephone Encounter (Signed)
Patient says that he got caught with xanax outside of  His bottle and he needs to come in and get documentation that he does have a prescription for this back in November. [phone]

## 2012-05-26 NOTE — Telephone Encounter (Signed)
Please advise, pended referral

## 2012-05-26 NOTE — Telephone Encounter (Signed)
Letter provided.     Patient advised

## 2012-05-26 NOTE — Telephone Encounter (Signed)
Ok to give him documentation of prescriptions

## 2012-05-28 NOTE — Telephone Encounter (Signed)
done

## 2012-05-28 NOTE — Telephone Encounter (Signed)
Thanks, I have advised patient.  

## 2012-07-27 ENCOUNTER — Ambulatory Visit: Payer: Self-pay | Admitting: Internal Medicine

## 2012-07-27 VITALS — BP 129/88 | HR 98 | Temp 97.7°F | Resp 18 | Ht 70.0 in | Wt 209.0 lb

## 2012-07-27 DIAGNOSIS — M546 Pain in thoracic spine: Secondary | ICD-10-CM

## 2012-07-27 MED ORDER — PREDNISONE 10 MG PO TABS: ORAL_TABLET | ORAL | Status: DC

## 2012-07-27 MED ORDER — DOXYCYCLINE HYCLATE 100 MG PO TABS: 100.0000 mg | ORAL_TABLET | Freq: Two times a day (BID) | ORAL | Status: DC

## 2012-07-27 MED ORDER — CYCLOBENZAPRINE HCL 10 MG PO TABS: 10.0000 mg | ORAL_TABLET | Freq: Three times a day (TID) | ORAL | Status: DC | PRN

## 2012-07-27 MED ORDER — OXYCODONE-ACETAMINOPHEN 10-325 MG PO TABS: 1.0000 | ORAL_TABLET | Freq: Four times a day (QID) | ORAL | Status: DC | PRN

## 2012-07-27 NOTE — Progress Notes (Signed)
  Subjective:    Patient ID: [name], male    DOB: 08-19-1970, 42 y.o.   MRN: [id number]  HPI 42 year old male presents with complaints  1. mid back pain.  Pain with a deep breath. 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  Has history of chronic lumbar pain but has never had back pain like this before    Review of Systems 2. chronic acne on his back as well as bumps and redness on his face. To be addressed at upcoming personal appointment.     Objective:   Physical Exam In obvious pain BP 129/88  Pulse 98  Temp(Src) 97.7 F (36.5 C) (Oral)  Resp 18  Ht 5\' 10"  (1.778 m)  Wt 209 lb (94.802 kg)  BMI 29.99 kg/m2  SpO2 99% Very tender to palp mid thoracic and to the L post thorac around T7 Pain w/ twist and flex BS adequate       Assessment & Plan:  Acute thoracic strain Plan- flexeril, pain medicine and prednisone  Meds ordered this encounter  Medications  . cyclobenzaprine (FLEXERIL) 10 MG tablet    Sig: Take 1 tablet (10 mg total) by mouth 3 (three) times daily as needed for muscle spasms.    Dispense:  30 tablet    Refill:  0  . predniSONE (DELTASONE) 10 MG tablet    Sig: 4/3/3/2/2/1/1 Single daily dose x 7 days    Dispense:  16 tablet    Refill:  0  . oxyCODONE-acetaminophen (PERCOCET) 10-325 MG per tablet    Sig: Take 1 tablet by mouth every 6 (six) hours as needed for pain.    Dispense:  12 tablet    Refill:  0

## 2012-08-04 ENCOUNTER — Encounter: Payer: Self-pay | Admitting: Internal Medicine

## 2012-09-23 ENCOUNTER — Encounter: Payer: Self-pay | Admitting: Internal Medicine

## 2012-09-23 ENCOUNTER — Ambulatory Visit (INDEPENDENT_AMBULATORY_CARE_PROVIDER_SITE_OTHER): Payer: Self-pay | Admitting: Internal Medicine

## 2012-09-23 VITALS — BP 122/92 | HR 84 | Temp 97.8°F | Resp 16 | Ht 69.0 in | Wt 201.0 lb

## 2012-09-23 DIAGNOSIS — G4700 Insomnia, unspecified: Secondary | ICD-10-CM

## 2012-09-23 DIAGNOSIS — G8929 Other chronic pain: Secondary | ICD-10-CM

## 2012-09-23 DIAGNOSIS — F1911 Other psychoactive substance abuse, in remission: Secondary | ICD-10-CM

## 2012-09-23 DIAGNOSIS — F411 Generalized anxiety disorder: Secondary | ICD-10-CM

## 2012-09-23 MED ORDER — ALPRAZOLAM 2 MG PO TABS: 2.0000 mg | ORAL_TABLET | Freq: Three times a day (TID) | ORAL | Status: DC | PRN

## 2012-09-23 MED ORDER — CITALOPRAM HYDROBROMIDE 40 MG PO TABS: 40.0000 mg | ORAL_TABLET | Freq: Every day | ORAL | Status: DC

## 2012-09-24 NOTE — Progress Notes (Signed)
F/u Patient Active Problem List   Diagnosis Date Noted  . Insomnia--- improved to the point that he is off trazodone  04/08/2012  . Substance abuse in remission----now on Suboxone 2 mg daily  Dr Wynonia Lawman and doing the best he has done in several years  04/08/2012  . GAD (generalized anxiety disorder)--- control with current medications /requires benzodiazepines 3 times a day  11/02/2011  . 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 alprazolam (XANAX) 2 MG tablet    Sig: Take 1 tablet (2 mg total) by mouth 3 (three) times daily as needed for anxiety.    Dispense:  90 tablet    Refill:  5  . citalopram (CELEXA) 40 MG tablet    Sig: Take 1 tablet (40 mg total) by mouth daily.    Dispense:  90 tablet    Refill:  3

## 2013-03-07 ENCOUNTER — Telehealth: Payer: Self-pay

## 2013-03-07 NOTE — Telephone Encounter (Signed)
Patient would like to talk to a nurse regarding a medical issue and wants to know if he should come in or not.  He is patient of Dr. Laney Pastor.

## 2013-03-08 NOTE — Telephone Encounter (Signed)
Pt states he ran a fever last week, and he doesn't have insurance until Feb. The fever got very high. His lymph nodes were swollen and painful in his groin area. His fever has subsided, but he is still having lymphadenopathy and has a rash in his buttocks. Told pt he would need to come in and be seen.

## 2013-05-12 ENCOUNTER — Ambulatory Visit (INDEPENDENT_AMBULATORY_CARE_PROVIDER_SITE_OTHER): Payer: BC Managed Care – PPO | Admitting: Internal Medicine

## 2013-05-12 ENCOUNTER — Encounter: Payer: Self-pay | Admitting: Internal Medicine

## 2013-05-12 VITALS — BP 120/86 | HR 85 | Temp 97.5°F | Resp 16 | Ht 69.5 in | Wt 219.6 lb

## 2013-05-12 DIAGNOSIS — G4700 Insomnia, unspecified: Secondary | ICD-10-CM

## 2013-05-12 DIAGNOSIS — F1911 Other psychoactive substance abuse, in remission: Secondary | ICD-10-CM

## 2013-05-12 DIAGNOSIS — F411 Generalized anxiety disorder: Secondary | ICD-10-CM

## 2013-05-12 DIAGNOSIS — M549 Dorsalgia, unspecified: Secondary | ICD-10-CM

## 2013-05-12 DIAGNOSIS — R509 Fever, unspecified: Secondary | ICD-10-CM

## 2013-05-12 DIAGNOSIS — G8929 Other chronic pain: Secondary | ICD-10-CM

## 2013-05-12 DIAGNOSIS — Z13220 Encounter for screening for lipoid disorders: Secondary | ICD-10-CM

## 2013-05-12 LAB — COMPREHENSIVE METABOLIC PANEL
ALBUMIN: 4.4 g/dL (ref 3.5–5.2)
ALK PHOS: 62 U/L (ref 39–117)
ALT: 50 U/L (ref 0–53)
AST: 37 U/L (ref 0–37)
BUN: 20 mg/dL (ref 6–23)
CALCIUM: 9.5 mg/dL (ref 8.4–10.5)
CO2: 25 meq/L (ref 19–32)
Chloride: 105 mEq/L (ref 96–112)
Creat: 0.84 mg/dL (ref 0.50–1.35)
GLUCOSE: 74 mg/dL (ref 70–99)
POTASSIUM: 4.2 meq/L (ref 3.5–5.3)
SODIUM: 138 meq/L (ref 135–145)
TOTAL PROTEIN: 7.5 g/dL (ref 6.0–8.3)
Total Bilirubin: 0.4 mg/dL (ref 0.2–1.2)

## 2013-05-12 LAB — CBC WITH DIFFERENTIAL/PLATELET
BASOS PCT: 0 % (ref 0–1)
Basophils Absolute: 0 10*3/uL (ref 0.0–0.1)
EOS ABS: 0.1 10*3/uL (ref 0.0–0.7)
Eosinophils Relative: 1 % (ref 0–5)
HEMATOCRIT: 45.1 % (ref 39.0–52.0)
HEMOGLOBIN: 16.1 g/dL (ref 13.0–17.0)
LYMPHS ABS: 2 10*3/uL (ref 0.7–4.0)
Lymphocytes Relative: 27 % (ref 12–46)
MCH: 31.6 pg (ref 26.0–34.0)
MCHC: 35.7 g/dL (ref 30.0–36.0)
MCV: 88.6 fL (ref 78.0–100.0)
MONO ABS: 0.4 10*3/uL (ref 0.1–1.0)
MONOS PCT: 6 % (ref 3–12)
NEUTROS ABS: 4.9 10*3/uL (ref 1.7–7.7)
NEUTROS PCT: 66 % (ref 43–77)
Platelets: 302 10*3/uL (ref 150–400)
RBC: 5.09 MIL/uL (ref 4.22–5.81)
RDW: 13.8 % (ref 11.5–15.5)
WBC: 7.4 10*3/uL (ref 4.0–10.5)

## 2013-05-12 LAB — LIPID PANEL
CHOLESTEROL: 209 mg/dL — AB (ref 0–200)
HDL: 40 mg/dL (ref >39)
LDL Cholesterol: 129 mg/dL — ABNORMAL HIGH (ref 0–99)
TRIGLYCERIDES: 200 mg/dL — AB (ref <150)
Total CHOL/HDL Ratio: 5.2 Ratio
VLDL: 40 mg/dL (ref 0–40)

## 2013-05-12 MED ORDER — CITALOPRAM HYDROBROMIDE 40 MG PO TABS: 40.0000 mg | ORAL_TABLET | Freq: Every day | ORAL | Status: DC

## 2013-05-12 MED ORDER — ALPRAZOLAM 2 MG PO TABS: 2.0000 mg | ORAL_TABLET | Freq: Three times a day (TID) | ORAL | Status: DC | PRN

## 2013-05-12 NOTE — Progress Notes (Signed)
   Subjective:    Patient ID: [name], male    DOB: Jun 28, 1970, 43 y.o.   MRN: [id number]  HPI followup  Patient Active Problem List   Diagnosis Date Noted  . Insomnia 04/08/2012  . Substance abuse in remission 04/08/2012  . GAD (generalized anxiety disorder) 11/02/2011  . 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////anxiety and sleep stable  N/V/Fever for 2-3 weeks just resolving//blisters at belt line during fever/worries about his immunity Had diarrhea for a few days at the onset/ No sore throat cough or upper respiratory symptoms/no abdominal pain/no urinary problems  Hx neg hep C/neg HIV since he started his recovery--doing well with recovery  Review of Systems Derm recently for red skin plus acne-says erythroderma--zolagel plus sulfa wash/acne meds No weight loss No fatigue No night sweats No chest pain or palpitations No bony joint problems besides his chronic back pain which is greatly improved    Objective:   Physical Exam BP 120/86  Pulse 85  Temp(Src) 97.5 F (36.4 C) (Oral)  Resp 16  Ht 5' 9.5" (1.765 m)  Wt 219 lb 9.6 oz (99.61 kg)  BMI 31.98 kg/m2  SpO2 98% HEENT clear without thyromegaly or lymphadenopathy Chest clear Heart regular without murmur Abdomen benign Extremities clear Mood good affect appropriate Skin clear       Assessment & Plan:  Fever, unspecified - Plan: CBC with Differential, Comprehensive metabolic panel  Screening for hyperlipidemia - Plan: Lipid panel  Chronic back pain  GAD (generalized anxiety disorder)  Insomnia  Substance abuse in remission  Meds ordered this encounter  Medications  . alprazolam (XANAX) 2 MG tablet    Sig: Take 1 tablet (2 mg total) by mouth 3 (three) times daily as needed for anxiety.    Dispense:  90 tablet    Refill:  5  . citalopram (CELEXA) 40 MG tablet    Sig: Take 1 tablet (40 mg total) by mouth daily.    Dispense:  90  tablet    Refill:  1   Followup in 3-6 months

## 2013-05-17 ENCOUNTER — Encounter: Payer: Self-pay | Admitting: Internal Medicine

## 2013-07-27 ENCOUNTER — Other Ambulatory Visit: Payer: Self-pay | Admitting: Dermatology

## 2013-08-20 ENCOUNTER — Other Ambulatory Visit: Payer: Self-pay | Admitting: *Deleted

## 2013-08-20 MED ORDER — CITALOPRAM HYDROBROMIDE 40 MG PO TABS: 40.0000 mg | ORAL_TABLET | Freq: Every day | ORAL | Status: DC

## 2013-09-03 ENCOUNTER — Ambulatory Visit (INDEPENDENT_AMBULATORY_CARE_PROVIDER_SITE_OTHER): Payer: BC Managed Care – PPO | Admitting: Family Medicine

## 2013-09-03 VITALS — BP 132/88 | HR 76 | Temp 97.6°F | Resp 16 | Ht 69.25 in | Wt 213.0 lb

## 2013-09-03 DIAGNOSIS — F411 Generalized anxiety disorder: Secondary | ICD-10-CM

## 2013-09-03 DIAGNOSIS — D1030 Benign neoplasm of unspecified part of mouth: Secondary | ICD-10-CM

## 2013-09-03 DIAGNOSIS — F419 Anxiety disorder, unspecified: Secondary | ICD-10-CM

## 2013-09-03 DIAGNOSIS — D1039 Benign neoplasm of other parts of mouth: Secondary | ICD-10-CM

## 2013-09-03 DIAGNOSIS — L989 Disorder of the skin and subcutaneous tissue, unspecified: Secondary | ICD-10-CM

## 2013-09-03 NOTE — Patient Instructions (Signed)
I will let you know the results of the pathology. If you do not hear from me within about 10 days call and see if I have the report.  Keep a view in your mouth and return if you see further lesions recurring  If you decide you want the place is taken off your arm we can do so  Consider quitting smoking

## 2013-09-03 NOTE — Progress Notes (Signed)
Subjective: Patient is very upset. He Lortab off from underneath this time a week or 2 ago. This had come up after he had gotten some dental treatment done. He now has to warty-looking places underneath his left upper lid. He says he and his wife are faithful to each other, wonders whether he got anything from the dental office, and once of these warts. They have been reading on the Internet. He does smoke he also has some old warty-like skin lesions on his right shoulder. A dermatologist told him those were birthmarks but says that has not, that they've only been there for 12 years.  He is very anxious, worries about inability to have oral contact with his wife in any fashion.  Objective: Under his left upper lip or too little warty-looking lesions. These were using some topical lidocaine, then clipped off with a scalpel and sent to pathology. Both had to be cauterized with silver nitrate. He was not well anesthetized and this caused him pain he tolerated well.  He has a little 1 inch band of nodular or places on his right shoulder that look like old scar of warts  Assessment: Oral papilloma Skin lesions right arm Anxiety  Plan: Specimens were sent to pathology.  Explained to the patient that I am sure with lesions like this, that if they are viral, he is arty shared them with his wife. He has to decide whether to continue on with oral mouth or genital contact. His wife needs to continue getting repacked. Slowly a wart virus could have been carried into the relationship with either one of them before they got together 4 years ago. I cannot say whether any exposure to dental office could have caused this.

## 2013-09-10 ENCOUNTER — Telehealth: Payer: Self-pay | Admitting: *Deleted

## 2013-09-10 NOTE — Telephone Encounter (Signed)
Pt called in concerning results of the Derm Path that was done last week. Please advise

## 2013-09-12 NOTE — Telephone Encounter (Signed)
I thought I already left a note on this.  It was a benign skin tag (non-cancerous).  No further concern unless he sees something grow back, which it should not.

## 2013-09-13 NOTE — Telephone Encounter (Signed)
Lm for rtn call 

## 2013-09-13 NOTE — Telephone Encounter (Signed)
Advised pt lab results

## 2013-09-24 ENCOUNTER — Ambulatory Visit (INDEPENDENT_AMBULATORY_CARE_PROVIDER_SITE_OTHER): Payer: BC Managed Care – PPO | Admitting: Family Medicine

## 2013-09-24 ENCOUNTER — Ambulatory Visit (INDEPENDENT_AMBULATORY_CARE_PROVIDER_SITE_OTHER): Payer: BC Managed Care – PPO

## 2013-09-24 VITALS — BP 120/76 | HR 88 | Temp 98.0°F | Resp 17 | Ht 70.0 in | Wt 211.0 lb

## 2013-09-24 DIAGNOSIS — H9312 Tinnitus, left ear: Secondary | ICD-10-CM

## 2013-09-24 DIAGNOSIS — M79609 Pain in unspecified limb: Secondary | ICD-10-CM

## 2013-09-24 DIAGNOSIS — M79642 Pain in left hand: Secondary | ICD-10-CM

## 2013-09-24 DIAGNOSIS — H9319 Tinnitus, unspecified ear: Secondary | ICD-10-CM

## 2013-09-24 NOTE — Patient Instructions (Signed)
I do not see any sign of a fracture in your hand.  Let me know if it does not stop hurting in the next 2 weeks or so.  You likely have a form of tinnitus in your ear.  Hopefully it will stop- if it does not we can refer you to ENT for further evaluation.

## 2013-09-24 NOTE — Progress Notes (Signed)
[facility] [address], [place] [address] [phone]  Date:  09/24/2013   Name:  [name]   DOB:  Sep 01, 1970   MRN:  [id number]  PCP:  No PCP Per Patient    Chief Complaint: clicking in ear   History of Present Illness:  [name] is a 43 y.o. very pleasant male patient who presents with the following:  A couple of weeks ago he developed a clicking sound in his left ear.  He thought there might be water in his ear.  There is no pain, and his hearing seems to be ok.  He has not been swimming, but does take showers.  He is not having any buzzing or ringing.  The frequency of the clicking will wax and wane.  Right now it is not bad but yesterday it was worse, no history of tinnitus  Also about 10 days ago he fell back onto his left hand- Christie injury.  He noted swelling and bruising of the left hand around the thumb.  This is now better but still sore- thought he would mention this as well  Patient Active Problem List   Diagnosis Date Noted  . Insomnia 04/08/2012  . Substance abuse in remission 04/08/2012  . GAD (generalized anxiety disorder) 11/02/2011  . Chronic back pain 11/02/2011    Past Medical History  Diagnosis Date  . Anxiety   . Depression   . Kidney stones   . Substance abuse     former abuser methodone and oxycodone  . MRSA (methicillin resistant staph aureus) culture positive     Past Surgical History  Procedure Laterality Date  . Kidney stones      History  Substance Use Topics  . Smoking status: Current Every Day Smoker -- 1.00 packs/day for 10 years    Types: Cigarettes    Last Attempt to Quit: 02/04/2010  . Smokeless tobacco: Not on file  . Alcohol Use: No    Family History  Problem Relation Age of Onset  . Hypertension Mother   . Cancer Maternal Grandmother     ovarian  . Diabetes Paternal Grandfather     Allergies  Allergen Reactions  . Codeine Itching and Nausea Only  . Tylenol  [Acetaminophen] Other (See Comments)    headache    Medication list has been reviewed and updated.  Current Outpatient Prescriptions on File Prior to Visit  Medication Sig Dispense Refill  . alprazolam (XANAX) 2 MG tablet Take 1 tablet (2 mg total) by mouth 3 (three) times daily as needed for anxiety.  90 tablet  5  . citalopram (CELEXA) 40 MG tablet Take 1 tablet (40 mg total) by mouth daily.  90 tablet  1   No current facility-administered medications on file prior to visit.    Review of Systems:  As per HPI- otherwise negative.   Physical Examination: Filed Vitals:   09/24/13 1720  BP: 120/76  Pulse: 88  Temp: 98 F (36.7 C)  Resp: 17   Filed Vitals:   09/24/13 1720  Height: 5\' 10"  (1.778 m)  Weight: 211 lb (95.709 kg)   Body mass index is 30.28 kg/(m^2). Ideal Body Weight: Weight in (lb) to have BMI = 25: 173.9 GEN: WDWN, NAD, Non-toxic, A & O x 3, looks well HEENT: Atraumatic, Normocephalic. Neck supple. No masses, No LAD.  Bilateral TM wnl, oropharynx normal.  PEERL,EOMI.  Removed a moderate amount of wax from the left external canal with  curette. Otherwise both ears normal  Ears and Nose: No external deformity. CV: RRR, No M/G/R. No JVD. No thrill. No extra heart sounds. PULM: CTA B, no wheezes, crackles, rhonchi. No retractions. No resp. distress. No accessory muscle use. EXTR: No c/c/e NEURO Normal gait.  PSYCH: Normally interactive. Conversant. Not depressed or anxious appearing.  Calm demeanor.  Left hand: he is tender over the 1st MC.  Minimal swelling, no bruise or heat/ redness   [facility] reading (PRIMARY) by  Dr. Lorelei Pont. Left hand: negative LEFT HAND - COMPLETE 3+ VIEW  COMPARISON: 11/22/2004  FINDINGS: There is no evidence of fracture or dislocation. There is no evidence of arthropathy or other focal bone abnormality. Soft tissues are unremarkable.  IMPRESSION: Negative.  Assessment and Plan: Pain of left hand - Plan: DG Hand Complete  Left  Clicking tinnitus of left ear  Likely a form of tinnitus in the left ear.  He will let me know if this persists and I will refer to ENT.   Reassured that hand seems contused but not fractured    Signed Lamar Blinks, MD

## 2013-10-04 ENCOUNTER — Telehealth: Payer: Self-pay

## 2013-10-04 DIAGNOSIS — S60222A Contusion of left hand, initial encounter: Secondary | ICD-10-CM

## 2013-10-04 NOTE — Telephone Encounter (Signed)
Please advise referral to Ortho is warranted.

## 2013-10-04 NOTE — Telephone Encounter (Signed)
Pt is still having hand pain and feels that he needs to go to [facility] or whoever the doctor feels he should see   Best number

## 2013-10-04 NOTE — Telephone Encounter (Signed)
I will refer him to ortho.  Called and LMOM- if he would like to be seen in the meantime or if he would like a splint of some sort please call or come in.

## 2013-10-06 ENCOUNTER — Other Ambulatory Visit: Payer: Self-pay | Admitting: Internal Medicine

## 2013-10-06 ENCOUNTER — Telehealth: Payer: Self-pay

## 2013-10-06 NOTE — Telephone Encounter (Signed)
Patient left voicemail on Tuesday at 2:08 pm requesting x-rays for appt with the [facility]. Cb# [id number]. Will print and forward request to x-ray.

## 2013-10-07 ENCOUNTER — Other Ambulatory Visit: Payer: Self-pay | Admitting: Orthopedic Surgery

## 2013-10-07 DIAGNOSIS — M25532 Pain in left wrist: Secondary | ICD-10-CM

## 2013-10-15 ENCOUNTER — Other Ambulatory Visit: Payer: Self-pay

## 2013-10-15 ENCOUNTER — Inpatient Hospital Stay: Admission: RE | Admit: 2013-10-15 | Payer: Self-pay | Source: Ambulatory Visit

## 2013-11-05 ENCOUNTER — Other Ambulatory Visit: Payer: Self-pay | Admitting: Internal Medicine

## 2013-11-05 NOTE — Telephone Encounter (Signed)
Script called in to pharmacy  

## 2013-11-08 ENCOUNTER — Other Ambulatory Visit: Payer: Self-pay | Admitting: Internal Medicine

## 2013-11-17 ENCOUNTER — Encounter: Payer: BC Managed Care – PPO | Admitting: Internal Medicine

## 2013-11-17 MED ORDER — CITALOPRAM HYDROBROMIDE 40 MG PO TABS: 40.0000 mg | ORAL_TABLET | Freq: Every day | ORAL | Status: DC

## 2013-11-17 MED ORDER — ALPRAZOLAM 2 MG PO TABS: 2.0000 mg | ORAL_TABLET | Freq: Three times a day (TID) | ORAL | Status: DC | PRN

## 2013-11-17 NOTE — Progress Notes (Signed)
   Subjective:    Patient ID: [name], male    DOB: Jun 28, 1970, 43 y.o.   MRN: [id number]  HPI  Called--no show  Review of Systems     Objective:   Physical Exam        Assessment & Plan:   Meds ordered this encounter  Medications  . alprazolam (XANAX) 2 MG tablet    Sig: Take 1 tablet (2 mg total) by mouth 3 (three) times daily as needed for sleep.    Dispense:  90 tablet    Refill:  0  . citalopram (CELEXA) 40 MG tablet    Sig: Take 1 tablet (40 mg total) by mouth daily.    Dispense:  30 tablet    Refill:  2   F/u at 102 as walkin This encounter was created in error - please disregard.

## 2014-02-10 ENCOUNTER — Other Ambulatory Visit: Payer: Self-pay

## 2014-02-10 MED ORDER — CITALOPRAM HYDROBROMIDE 40 MG PO TABS: 40.0000 mg | ORAL_TABLET | Freq: Every day | ORAL | Status: DC

## 2014-02-15 ENCOUNTER — Encounter: Payer: Self-pay | Admitting: Internal Medicine

## 2014-02-16 ENCOUNTER — Other Ambulatory Visit: Payer: Self-pay | Admitting: Internal Medicine

## 2014-02-16 NOTE — Telephone Encounter (Signed)
Ok to call in  Meds ordered this encounter  Medications  . alprazolam (XANAX) 2 MG tablet    Sig: TAKE ONE TABLET BY MOUTH THREE TIMES DAILY AS NEEDED FOR SLEEP    Dispense:  90 tablet    Ref 0       I need to know who is prescribing these medicines that were filled in December :  . amphetamine-dextroamphetamine (ADDERALL) 20 MG tablet    Sig: Take 20 mg by mouth 3 (three) times daily.    Refill:  0  . SUBOXONE 8-2 MG FILM    Sig:     Refill:  0  . hydrOXYzine (VISTARIL) 25 MG capsule    Sig:         Refill:  5   Noticed that he missed his follow-up as scheduled in November and is overdue for follow-up. He currently has enough Celexa to last till the end of January in addition to this call in for Xanax. He needs to be seen before further medication

## 2014-02-18 NOTE — Telephone Encounter (Signed)
Called in alprazolam. Spoke to pt who reported that his psychiatrist Dr Toy Care had written the Rxs that were filled in Dec. He said that he had discussed going to see Dr Toy Care with Dr Laney Pastor, so he thinks he will be aware of this. Transferred pt to appt center for appt.

## 2014-03-09 ENCOUNTER — Encounter: Payer: Self-pay | Admitting: Internal Medicine

## 2014-03-09 ENCOUNTER — Ambulatory Visit (INDEPENDENT_AMBULATORY_CARE_PROVIDER_SITE_OTHER): Payer: 59 | Admitting: Internal Medicine

## 2014-03-09 VITALS — BP 128/100 | HR 100 | Temp 97.4°F | Resp 16 | Ht 69.5 in | Wt 219.4 lb

## 2014-03-09 DIAGNOSIS — G4700 Insomnia, unspecified: Secondary | ICD-10-CM | POA: Diagnosis not present

## 2014-03-09 DIAGNOSIS — M549 Dorsalgia, unspecified: Secondary | ICD-10-CM

## 2014-03-09 DIAGNOSIS — F1911 Other psychoactive substance abuse, in remission: Secondary | ICD-10-CM

## 2014-03-09 DIAGNOSIS — F411 Generalized anxiety disorder: Secondary | ICD-10-CM | POA: Diagnosis not present

## 2014-03-09 DIAGNOSIS — G8929 Other chronic pain: Secondary | ICD-10-CM | POA: Diagnosis not present

## 2014-03-09 DIAGNOSIS — F1910 Other psychoactive substance abuse, uncomplicated: Secondary | ICD-10-CM | POA: Diagnosis not present

## 2014-03-09 MED ORDER — ALPRAZOLAM 2 MG PO TABS: ORAL_TABLET | ORAL | Status: DC

## 2014-03-09 MED ORDER — CITALOPRAM HYDROBROMIDE 40 MG PO TABS: 40.0000 mg | ORAL_TABLET | Freq: Every day | ORAL | Status: DC

## 2014-03-09 NOTE — Progress Notes (Signed)
Follow-up for med refills Patient Active Problem List   Diagnosis Date Noted  . Insomnia--- much improved  04/08/2012  . Substance abuse in remission----continues to be clean under the care of Dr. Toy Care With Suboxone . she recognized during her beginnings of treatment that he had significant set attention deficit disorder often driving his anxiety and started him on Adderall which is greatly improved his focus at work and his ability to do things without creating anxiety.  04/08/2012  . GAD (generalized anxiety disorder)--- he continues with anxiety in all areas particularly social anxiety but does very well and his currently level of Xanax with Celexa 40 mg. The Xanax works less well than he did and we discussed benzodiazepine tolerance once again. He is been on Xanax for more than 20 years. He is doing better at this point in his life than any time in the last 10 years He is even been able to begin parenting his now 62 year old daughter again with the permission of his ex-wife and is doing well creating a stable relationship.  11/02/2011  . 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 Smoking cessation is underway with the use of vaporizer Sleeps well Feels healthy in general Is now a caretaker of a person much younger who is working for him but needs great support because of a bad family up bringing  Exam BP 128/100 mmHg  Pulse 100  Temp(Src) 97.4 F (36.3 C) (Oral)  Resp 16  Ht 5' 9.5" (1.765 m)  Wt 219 lb 6.4 oz (99.519 kg)  BMI 31.95 kg/m2  SpO2 98% Mood good affect appropriate thought content normal , judgment sound  Impression GAD (generalized anxiety disorder)--- very persistent despite medication  Chronic back pain--- resolving  Insomnia--stable  Substance abuse in remission//attention deficit disorder ----continue  follow-up with Dr.Kaur  Meds ordered this encounter  Medications  . citalopram (CELEXA) 40 MG tablet    Sig: Take 1 tablet (40 mg total) by mouth daily.    Dispense:  90 tablet    Refill:  3  . alprazolam (XANAX) 2 MG tablet    Sig: 1 tid prn anxiety    Dispense:  90 tablet    Refill:  5   Discussed benzodiazepine tolerance Discussed mindfulness--role of cognitive behavioral therapy Recommended daily exercise when he doesn't work Discussed additional medications to reduce need for benzos Encouraged further smoking cessation Follow-up 3-6 months

## 2014-05-18 ENCOUNTER — Ambulatory Visit: Payer: 59 | Admitting: Internal Medicine

## 2014-06-01 ENCOUNTER — Ambulatory Visit (INDEPENDENT_AMBULATORY_CARE_PROVIDER_SITE_OTHER): Payer: 59 | Admitting: Internal Medicine

## 2014-06-01 ENCOUNTER — Encounter: Payer: Self-pay | Admitting: Internal Medicine

## 2014-06-01 VITALS — BP 132/86 | HR 80 | Temp 98.4°F | Resp 16 | Ht 69.5 in | Wt 212.0 lb

## 2014-06-01 DIAGNOSIS — R7989 Other specified abnormal findings of blood chemistry: Secondary | ICD-10-CM

## 2014-06-01 DIAGNOSIS — N529 Male erectile dysfunction, unspecified: Secondary | ICD-10-CM | POA: Diagnosis not present

## 2014-06-01 DIAGNOSIS — E291 Testicular hypofunction: Secondary | ICD-10-CM

## 2014-06-01 MED ORDER — SILDENAFIL CITRATE 100 MG PO TABS: 50.0000 mg | ORAL_TABLET | Freq: Every day | ORAL | Status: DC | PRN

## 2014-06-01 MED ORDER — SILDENAFIL CITRATE 20 MG PO TABS: ORAL_TABLET | ORAL | Status: DC

## 2014-06-01 NOTE — Progress Notes (Addendum)
   Subjective:    Patient ID: [name], male    DOB: June 10, 1970, 44 y.o.   MRN: [id number]  HPI he is in a stable relationship, committed, and is having trouble with erectile dysfunction although not every time, but most times. He has been to discuss this with his psychiatrist Dr Toy Care, and at her direction used Viagra with good success, but he would like to further explore the options.  In the past I had tested him and found him to have low testosterone but this was during a time when he was abusing drugs.  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    Review of Systems No headaches or vision changes No problems with urination or nocturia No diabetes or hypertension No change in medications that might affect sexual functioning    Objective:   Physical Exam BP 132/86 mmHg  Pulse 80  Temp(Src) 98.4 F (36.9 C)  Resp 16  Ht 5' 9.5" (1.765 m)  Wt 212 lb (96.163 kg)  BMI 30.87 kg/m2  SpO2 95% Wt Readings from Last 3 Encounters:  06/01/14 212 lb (96.163 kg)  03/09/14 219 lb 6.4 oz (99.519 kg)  09/24/13 211 lb (95.709 kg)   No acute distress HEENT clear Heart regular Cranial nerves intact Mood good and affect appropriate       Assessment & Plan:  Erectile dysfunction, unspecified erectile dysfunction type - Plan: Testosterone,Free and Total  Meds ordered this encounter  Medications  . sildenafil (REVATIO) 20 MG tablet    Sig: Take 2 and 1/2 to 5 tablets daily as needed for erectile dysfunction    Dispense:  25 tablet    Refill:  0  This may be refilled as desired if it is as successful as Viagra The changes made for monetary reasons     Results for orders placed or performed in visit on 06/01/14  Testosterone,Free and Total  Result Value Ref Range   Testosterone 332 (L) 348 - 1197 ng/dL   Comment, Testosterone Comment    Testosterone, Free 4.7 (L) 6.8 - 21.5 pg/mL  Specimen  status report  Result Value Ref Range   specimen status report Comment   ref Dr Buddy Duty

## 2014-06-03 ENCOUNTER — Telehealth: Payer: Self-pay

## 2014-06-03 LAB — SPECIMEN STATUS REPORT

## 2014-06-03 NOTE — Telephone Encounter (Signed)
PA started for Sildenafil Citrate.

## 2014-06-04 LAB — TESTOSTERONE,FREE AND TOTAL
TESTOSTERONE: 332 ng/dL — AB (ref 348–1197)
Testosterone, Free: 4.7 pg/mL — ABNORMAL LOW (ref 6.8–21.5)

## 2014-06-08 ENCOUNTER — Other Ambulatory Visit: Payer: Self-pay | Admitting: Internal Medicine

## 2014-06-09 NOTE — Telephone Encounter (Signed)
When we sent in Viagra, we gave him 11 RF's. When we changed it to Sildenafil, we did not give RF's. Called pharm and authorized 11 RF's

## 2014-06-09 NOTE — Telephone Encounter (Signed)
[name] has already addressed this.

## 2014-06-10 NOTE — Telephone Encounter (Signed)
Faxed form to [facility] 

## 2014-07-16 NOTE — Addendum Note (Signed)
Addended by: Leandrew Koyanagi on: 07/16/2014 12:07 PM   Modules accepted: Orders, Level of Service

## 2014-07-19 ENCOUNTER — Other Ambulatory Visit: Payer: Self-pay | Admitting: Internal Medicine

## 2014-08-01 ENCOUNTER — Telehealth: Payer: Self-pay

## 2014-08-01 ENCOUNTER — Other Ambulatory Visit: Payer: Self-pay | Admitting: Internal Medicine

## 2014-08-01 NOTE — Telephone Encounter (Signed)
Rx called in to pharmacy. 

## 2014-08-01 NOTE — Telephone Encounter (Signed)
Rx for Alprazolam faxed to pharm.

## 2014-08-25 ENCOUNTER — Other Ambulatory Visit: Payer: Self-pay | Admitting: Internal Medicine

## 2014-08-26 ENCOUNTER — Telehealth: Payer: Self-pay

## 2014-08-26 NOTE — Telephone Encounter (Signed)
Patient made an appointment with Dr. Sonia Baller on 10/05/2014 @ 4:00 pm.     He is out of sildenafil (REVATIO) 20 MG tablet [[id number]] and want to know if it can be refilled until his appointment.  Pharmacy stated he had to have an appointment before we would refill.  Pharmacy - [address] [place], [place]  Pt # [phone]

## 2014-08-27 ENCOUNTER — Other Ambulatory Visit: Payer: Self-pay | Admitting: Physician Assistant

## 2014-08-29 MED ORDER — SILDENAFIL CITRATE 20 MG PO TABS: ORAL_TABLET | ORAL | Status: DC

## 2014-08-29 NOTE — Telephone Encounter (Signed)
Meds ordered this encounter  Medications  . sildenafil (REVATIO) 20 MG tablet    Sig: TAKE 2.5 TO 5 TABLET BY MOUTH EVERY DAY AS NEEDED FOR ERECTILE DYSFUNCTION    Dispense:  25 tablet    Refill:  2    This prescription was filled today. Any refills authorized will be placed on file.

## 2014-08-30 NOTE — Telephone Encounter (Signed)
Left message letting pt know Rx was sent in. 

## 2014-09-01 ENCOUNTER — Telehealth: Payer: Self-pay | Admitting: Physician Assistant

## 2014-09-01 ENCOUNTER — Ambulatory Visit (INDEPENDENT_AMBULATORY_CARE_PROVIDER_SITE_OTHER): Payer: 59 | Admitting: Physician Assistant

## 2014-09-01 VITALS — BP 124/80 | HR 81 | Temp 98.2°F | Resp 17 | Ht 69.5 in | Wt 205.0 lb

## 2014-09-01 DIAGNOSIS — X58XXXA Exposure to other specified factors, initial encounter: Secondary | ICD-10-CM

## 2014-09-01 DIAGNOSIS — S91132A Puncture wound without foreign body of left great toe without damage to nail, initial encounter: Secondary | ICD-10-CM

## 2014-09-01 DIAGNOSIS — W461XXA Contact with contaminated hypodermic needle, initial encounter: Secondary | ICD-10-CM

## 2014-09-01 DIAGNOSIS — Z23 Encounter for immunization: Secondary | ICD-10-CM

## 2014-09-01 LAB — HEPATITIS B SURFACE ANTIBODY, QUANTITATIVE: HEPATITIS B-POST: 0 m[IU]/mL

## 2014-09-01 LAB — HEPATITIS C ANTIBODY: HCV Ab: NEGATIVE

## 2014-09-01 LAB — HEPATITIS B SURFACE ANTIGEN: Hepatitis B Surface Ag: NEGATIVE

## 2014-09-01 LAB — HIV ANTIBODY (ROUTINE TESTING W REFLEX): HIV: NONREACTIVE

## 2014-09-01 NOTE — Patient Instructions (Signed)
You received the tdap and 1st hepatitis b vaccines today.  We are checking labs for HIV, Hepatitis B and Hepatitis C today.  Come back to see [place] in 6 weeks for the 2nd Hepatitis B vaccine and for labwork to check again for HIV and Hepatitis C. If you start having swollen lymph nodes, fevers, unusual fatigue, joint pain, night sweats, or unintended weight loss please come back to see [place] sooner.  You can try to take the needle to the walk in lab on [address] between [employer] and [place]. Ask them if they can test the needle for blood borne pathogens.   Td Vaccine (Tetanus and Diphtheria): What You Need to Know 1. Why get vaccinated? Tetanus  and diphtheria are very serious diseases. They are rare in the [place] today, but people who do become infected often have severe complications. Td vaccine is used to protect adolescents and adults from both of these diseases. Both tetanus and diphtheria are infections caused by bacteria. Diphtheria spreads from person to person through coughing or sneezing. Tetanus-causing bacteria enter the body through cuts, scratches, or wounds. TETANUS (Lockjaw) causes painful muscle tightening and stiffness, usually all over the body.  It can lead to tightening of muscles in the head and neck so you can't open your mouth, swallow, or sometimes even breathe. Tetanus kills about 1 out of every 5 people who are infected. DIPHTHERIA can cause a thick coating to form in the back of the throat.  It can lead to breathing problems, paralysis, heart failure, and death. Before vaccines, the [place] saw as many as 200,000 cases a year of diphtheria and hundreds of cases of tetanus. Since vaccination began, cases of both diseases have dropped by about 99%. 2. Td vaccine Td vaccine can protect adolescents and adults from tetanus and diphtheria. Td is usually given as a booster dose every 10 years but it can also be given earlier after a severe and dirty wound or  burn. Your doctor can give you more information. Td may safely be given at the same time as other vaccines. 3. Some people should not get this vaccine  If you ever had a life-threatening allergic reaction after a dose of any tetanus or diphtheria containing vaccine, OR if you have a severe allergy to any part of this vaccine, you should not get Td. Tell your doctor if you have any severe allergies.  Talk to your doctor if you:  have epilepsy or another nervous system problem,  had severe pain or swelling after any vaccine containing diphtheria or tetanus,  ever had Guillain Barr Syndrome (GBS),  aren't feeling well on the day the shot is scheduled. 4. Risks of a vaccine reaction With a vaccine, like any medicine, there is a chance of side effects. These are usually mild and go away on their own. Serious side effects are also possible, but are very rare. Most people who get Td vaccine do not have any problems with it. Mild Problems  following Td (Did not interfere with activities)  Pain where the shot was given (about 8 people in 10)  Redness or swelling where the shot was given (about 1 person in 3)  Mild fever (about 1 person in 15)  Headache or Tiredness (uncommon) Moderate Problems following Td (Interfered with activities, but did not require medical attention)  Fever over 102F (rare) Severe Problems  following Td (Unable to perform usual activities; required medical attention)  Swelling, severe pain, bleeding and/or redness in  the arm where the shot was given (rare). Problems that could happen after any vaccine:  Brief fainting spells can happen after any medical procedure, including vaccination. Sitting or lying down for about 15 minutes can help prevent fainting, and injuries caused by a fall. Tell your doctor if you feel dizzy, or have vision changes or ringing in the ears.  Severe shoulder pain and reduced range of motion in the arm where a shot was given can  happen, very rarely, after a vaccination.  Severe allergic reactions from a vaccine are very rare, estimated at less than 1 in a million doses. If one were to occur, it would usually be within a few minutes to a few hours after the vaccination. 5. What if there is a serious reaction? What should I look for?  Look for anything that concerns you, such as signs of a severe allergic reaction, very high fever, or behavior changes. Signs of a severe allergic reaction can include hives, swelling of the face and throat, difficulty breathing, a fast heartbeat, dizziness, and weakness. These would usually start a few minutes to a few hours after the vaccination. What should I do?  If you think it is a severe allergic reaction or other emergency that can't wait, call 9-1-1 or get the person to the nearest hospital. Otherwise, call your doctor.  Afterward, the reaction should be reported to the Vaccine Adverse Event Reporting System (VAERS). Your doctor might file this report, or you can do it yourself through the VAERS web site at www.vaers.SamedayNews.es, or by calling 678-820-5224. VAERS is only for reporting reactions. They do not give medical advice. 6. The National Vaccine Injury Compensation Program The [employer] Vaccine Injury Compensation Program (VICP) is a federal program that was created to compensate people who may have been injured by certain vaccines. Persons who believe they may have been injured by a vaccine can learn about the program and about filing a claim by calling 212-053-9731 or visiting the [place] website at GoldCloset.com.ee. 7. How can I learn more?  Ask your doctor.  Contact your local or state health department.  Contact the Centers for Disease Control and Prevention (CDC):  Call 701-887-3459 (1-800-CDC-INFO)  Visit CDC's website at http://hunter.com/ CDC Td Vaccine Interim VIS (03/17/12) Document Released: 11/25/2005 Document Revised: 06/14/2013 Document  Reviewed: 05/12/2013 [facility] Patient Information 2015 [place], [place]. This information is not intended to replace advice given to you by your health care provider. Make sure you discuss any questions you have with your health care provider.  Hepatitis B Hepatitis B is a viral infection of the liver. Over half the people who become infected with hepatitis B never feel sick. However, some may later develop long-term liver disease (chronic hepatitis). There are 2 phases of the disease: sudden (acute) and longstanding (chronic). CAUSES Hepatitis B is caused by the hepatitis B virus (HBV). It can enter the body by sharing needles contaminated with blood from an infected person, by sharing intimate items such as toothbrushes and razors, or by sex with an infected person. A baby can get HBV from its birth mother. A caregiver may also get it from exposure to the blood of an infected patient by way of a cut or needle stick.  SYMPTOMS Acute Phase Many cases of acute HBV infection are mild and cause few problems.Some people may not even realize they are sick.Symptoms in others may last a few weeks to several months and include:  Loss of appetite.  Feeling very tired.  Nausea.  Vomiting.  Abdominal pain.  Dark yellow urine.  Yellow skin and eyes (jaundice). Chronic Phase  About 5% of people who get HBV infection become "chronic carriers." They often have no symptoms, but the virus stays in their body. They may spread the virus to others and can get long-term liver disease. The younger a child is when the infection starts, the more likely that child will be a carrier.  About 25% of chronic HBV carriers get a disease called "chronic active hepatitis." These people may develop scarring of the liver (cirrhosis), liver failure, or liver cancer. DIAGNOSIS Your caregiver can do a blood test to see if you have the disease. TREATMENT Acute hepatitis B does not usually require any drug treatment. It is  important to avoid medicines such as acetaminophen that may cause increasing liver damage.  Treatment with many antiviral drugs is available and recommended for some patients with hepatitis B infection. The goal is to reduce the risk of progressive chronic liver disease, transmission of infection to others, and other long-term complications such as cirrhosis, liver failure, and liver cancer. Drug treatment is often advised for people with:  Acute liver failure.  Clinical complications of cirrhosis.  Cirrhosis or advanced fibrosis with high serum measurements of viral DNA.  Reactivation of chronic HBV after chemotherapy or immunosuppression. Immediate drug treatment is not often advised for patients who have chronic infection but normal liver enzyme tests or patients who have a positive hepatitis B DNA test in blood but no other signs of active infection.Patients may have other circumstances that suggest a need or potential benefit from drug treatment. Successful treatment currently requires taking treatment drugs over a long period of time. An injected drug (interferon) may be given daily, 3 times a week, or once weekly for up to 1 year. An oral drug treatment plan may require daily dosing for many years or indefinitely, in order to prevent infection reactivation and worsening of liver disease. Side effects from these drugs are common and some may be very serious. Your response to treatment must be carefully monitored by both you and your caregiver throughout the entire treatment period. PREVENTION Hepatitis B vaccine is highly effective in preventing a hepatitis B infection.The vaccine is recommended worldwide for all newborns of hepatitis B infected mothers, and in many countries for all newborns. Hepatitis B vaccine is also recommended in the [place] for other people at higher than normal risk of getting an infection, including:  Sexually active people with multiple sex partners.  Homosexual and  bisexual men.  People who live with someone who has hepatitis B.  Injection drug users.  Healthcare workers.  Patients on chronic hemodialysis and patients who need repeated blood or blood product transfusions.  Patients with chronic liver disease due to any cause.  Unvaccinated people traveling to areas with high levels of local HBV infection.  Patients with diabetes. Hepatitis B immune globulin (HBIG) is often given with hepatitis B vaccine to people who have been exposed to blood contaminated with HBV. The HBIG protects you from the virus for the first 1 to 3 months. After that, the hepatitis B vaccine takes over and gives you long-term protection. Your caregiver will help you decide whether and when to get these shots following exposure to HBV. Healthcare workers need to avoid injuries and wear appropriate protective equipment such as gloves, gowns, and face masks when performing invasive medical or nursing procedures.  HOME CARE INSTRUCTIONS   Rest when you feel tired, and eat when you  are hungry.  Avoid a sexual relationship until advised otherwise by your caregiver.  Avoid activities that could expose other people to your blood. Examples include sharing a toothbrush, nail clippers, razors, and needles.  This infection is contagious. Follow your caregiver's instructions in order to avoid spread of the infection.  Do not take any medicines until your caregiver says it is okay. This includes over-the-counter drugs such as acetominophen that are usually taken for fever or pain. SEEK IMMEDIATE MEDICAL CARE IF:   You are unable to eat or drink.  You feel sick to your stomach (nauseous) or throw up (vomit).  You feel confused.  Jaundice becomes more severe.  You have trouble breathing, a rash, or swelling of the skin, throat, mouth, or face. You may be having an allergic reaction to the medicine in the shot.  You start twitching or shaking (seizure).  You become very sleepy  or have trouble waking up. MAKE SURE YOU:   Understand these instructions.  Will watch your condition.  Will get help right away if you are not doing well or get worse. Document Released: 01/26/2000 Document Revised: 04/22/2011 Document Reviewed: 05/07/2013 [facility] Patient Information 2015 [place], [place]. This information is not intended to replace advice given to you by your health care provider. Make sure you discuss any questions you have with your health care provider.

## 2014-09-01 NOTE — Telephone Encounter (Signed)
Patient was seen today for a needle stick in his toe. He states that he spoke with another doctor and they told him to get a Pep Cocktail. He states this is supposed to kill the HIV virus if it got in his system.  [phone]

## 2014-09-01 NOTE — Telephone Encounter (Signed)
Attempted to call pt to discuss option of PREP cocktail. Instructed to call back.

## 2014-09-01 NOTE — Progress Notes (Signed)
   Subjective:    Patient ID: [name], male    DOB: 02-23-1970, 44 y.o.   MRN: [id number]  Chief Complaint  Patient presents with  . needle stick in toe at work   Medications, allergies, past medical history, surgical history, family history, social history and problem list reviewed and updated.  HPI  44 yom presents after needle exposure.   Wearing flip flops in parking lot at work 2 hrs pta. Stepped on small hyperdermic needle, into underside left great toe. Source unknown. Removed needle, packed it, and came here to clinic.   Unsure of his vaccination status. Could not find on [facility].   Review of Systems No fevers, chills.     Objective:   Physical Exam  Constitutional: He is oriented to person, place, and time. He appears well-developed and well-nourished.  Non-toxic appearance. He does not have a sickly appearance. He does not appear ill. No distress.  BP 124/80 mmHg  Pulse 81  Temp(Src) 98.2 F (36.8 C) (Oral)  Resp 17  Ht 5' 9.5" (1.765 m)  Wt 205 lb (92.987 kg)  BMI 29.85 kg/m2  SpO2 98%   Neurological: He is alert and oriented to person, place, and time.  Psychiatric: He has a normal mood and affect. His speech is normal and behavior is normal.      Assessment & Plan:   Needle exposure, initial encounter - Plan: Tdap vaccine greater than or equal to 7yo IM, Hepatitis B vaccine adult IM, Hepatitis B surface antibody, Hepatitis B surface antigen, Hepatitis C antibody, HIV antibody --post exposure source unknown sheet filled out --tdap, hep b #1 given today --drawing hiv/hep c/hep b --discussed post exposure prophylaxis with pt who declines at this time --rtc 6 wks for labs/2nd hep b --information given for lab on [address] where pt may be able to take needle to see if they can test it for blood borne pathogens, he plans to do this today  Julieta Gutting, PA-C Physician Assistant-Certified [facility]  09/01/2014 10:55 AM

## 2014-09-01 NOTE — Telephone Encounter (Signed)
Spoke with pt. States when he left clinic he was told by an MD elsewhere that he should have prep therapy and is reconsidering declining it here. Discussed this option with pt. Pt informed that if he is indeed interested we are happy to proceed with starting the medication regimen. Pt instructed we will need to check kidney function prior to starting. He states he will sleep on it and let us know what he decides tomorrow.

## 2014-10-05 ENCOUNTER — Ambulatory Visit: Payer: 59 | Admitting: Internal Medicine

## 2014-10-19 ENCOUNTER — Ambulatory Visit (INDEPENDENT_AMBULATORY_CARE_PROVIDER_SITE_OTHER): Payer: 59 | Admitting: Internal Medicine

## 2014-10-19 ENCOUNTER — Encounter: Payer: Self-pay | Admitting: Internal Medicine

## 2014-10-19 VITALS — BP 132/88 | HR 89 | Temp 98.5°F | Resp 16 | Ht 70.0 in | Wt 204.0 lb

## 2014-10-19 DIAGNOSIS — Z23 Encounter for immunization: Secondary | ICD-10-CM | POA: Diagnosis not present

## 2014-10-19 MED ORDER — SILDENAFIL CITRATE 20 MG PO TABS: ORAL_TABLET | ORAL | Status: DC

## 2014-10-19 MED ORDER — CITALOPRAM HYDROBROMIDE 40 MG PO TABS: 40.0000 mg | ORAL_TABLET | Freq: Every day | ORAL | Status: DC

## 2014-10-19 NOTE — Progress Notes (Signed)
Here for follow-up Patient Active Problem List   Diagnosis Date Noted  . Insomnia 04/08/2012  . Substance abuse in remission 04/08/2012  . GAD (generalized anxiety disorder) 11/02/2011  . Chronic back pain 11/02/2011    -  erectile dysfunction with normal testosterone level  Outpatient Encounter Prescriptions as of 10/19/2014  Medication Sig Note  . alprazolam (XANAX) 2 MG tablet TAKE ONE TABLET BY MOUTH THREE TIMES DAILY AS NEEDED FOR ANXIETY   . amphetamine-dextroamphetamine (ADDERALL) 20 MG tablet Take 20 mg by mouth 3 (three) times daily. 02/16/2014: Received from: External Pharmacy  . citalopram (CELEXA) 40 MG tablet Take 1 tablet (40 mg total) by mouth daily.   . hydrOXYzine (VISTARIL) 25 MG capsule  02/16/2014: Received from: External Pharmacy  . sildenafil (REVATIO) 20 MG tablet TAKE 2.5 TO 5 TABLET BY MOUTH EVERY DAY AS NEEDED FOR ERECTILE DYSFUNCTION   . SUBOXONE 8-2 MG FILM  02/16/2014: Received from: External Pharmacy  . [DISCONTINUED] sildenafil (REVATIO) 20 MG tablet TAKE 2.5 TO 5 TABLET BY MOUTH EVERY DAY AS NEEDED FOR ERECTILE DYSFUNCTION    No facility-administered encounter medications on file as of 10/19/2014.   He is doing extremely well now. 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 He continues in a stable relationship. Sinuous to see Dr Toy Care who is treating him with Suboxone for his history of opiate abuse and with Adderall for his history recent diagnosis of attention deficit disorder. This is greatly helped his productivity and efficiency with his business.  He has no immediate health concerns  Note that he had a needle stick 2 months ago and is here for his 2 month follow-up Remains asymptomatic  Impression Generalized anxiety disorder(s/p opiate abuse) ED Potential body fluid exposure Meds ordered this encounter  Medications  . sildenafil (REVATIO) 20 MG tablet    Sig: TAKE 2.5 TO 5  TABLET BY MOUTH EVERY DAY AS NEEDED FOR ERECTILE DYSFUNCTION    Dispense:  25 tablet    Refill:  2    This prescription was filled today. Any refills authorized will be placed on file.  . citalopram (CELEXA) 40 MG tablet    Sig: Take 1 tablet (40 mg total) by mouth daily.    Dispense:  90 tablet    Refill:  1  he has xanax-Call nov for xanax til feb f/u  Need for immunization against influenza -Given today Need for hepatitis B vaccination -Given today He defers any follow-up blood testing today and defers to his 6 month follow-up(in 5 months)

## 2015-01-06 ENCOUNTER — Telehealth: Payer: Self-pay

## 2015-01-06 NOTE — Telephone Encounter (Signed)
Dr. Laney Pastor   Patient is requesting sildenafil (REVATIO) 20 MG tablet With plenty of refills.   Dr. Laney Pastor specifically stated he would refill this medication without question.   [facility] on [address].  [phone]

## 2015-01-07 MED ORDER — SILDENAFIL CITRATE 20 MG PO TABS: ORAL_TABLET | ORAL | Status: DC

## 2015-01-07 NOTE — Telephone Encounter (Signed)
Rx sent 

## 2015-01-19 ENCOUNTER — Telehealth: Payer: Self-pay

## 2015-01-19 ENCOUNTER — Other Ambulatory Visit: Payer: Self-pay | Admitting: Internal Medicine

## 2015-01-19 MED ORDER — SILDENAFIL CITRATE 20 MG PO TABS: ORAL_TABLET | ORAL | Status: DC

## 2015-01-19 NOTE — Telephone Encounter (Signed)
[facility] is calling stating that patient is having to pay out of pocket and for medication (which they for got to give me the name of ) but he gets 300 tabls including refills and he is wanting to get them all now since it is cheaper  Please call [phone] shanna

## 2015-01-19 NOTE — Telephone Encounter (Signed)
Called pharm back and verified that the med is sildenafil. It is cheaper for the pt to pay OOP for #300 tabs all at once than getting RFs for a year. Dr Laney Pastor, is this OK with you?

## 2015-01-20 NOTE — Telephone Encounter (Signed)
Dr Laney Pastor sent in new Rx for #300.

## 2015-01-30 ENCOUNTER — Other Ambulatory Visit: Payer: Self-pay | Admitting: Internal Medicine

## 2015-01-31 NOTE — Telephone Encounter (Signed)
Rx faxed

## 2015-02-01 ENCOUNTER — Ambulatory Visit (INDEPENDENT_AMBULATORY_CARE_PROVIDER_SITE_OTHER): Payer: 59 | Admitting: Internal Medicine

## 2015-02-01 ENCOUNTER — Encounter: Payer: Self-pay | Admitting: Internal Medicine

## 2015-02-01 VITALS — BP 142/92 | HR 74 | Temp 98.4°F | Resp 16 | Ht 70.0 in | Wt 210.0 lb

## 2015-02-01 DIAGNOSIS — Z23 Encounter for immunization: Secondary | ICD-10-CM | POA: Diagnosis not present

## 2015-02-01 DIAGNOSIS — R030 Elevated blood-pressure reading, without diagnosis of hypertension: Secondary | ICD-10-CM | POA: Diagnosis not present

## 2015-02-01 DIAGNOSIS — F419 Anxiety disorder, unspecified: Secondary | ICD-10-CM

## 2015-02-01 DIAGNOSIS — IMO0001 Reserved for inherently not codable concepts without codable children: Secondary | ICD-10-CM

## 2015-02-01 MED ORDER — CITALOPRAM HYDROBROMIDE 40 MG PO TABS: 40.0000 mg | ORAL_TABLET | Freq: Every day | ORAL | Status: DC

## 2015-02-02 DIAGNOSIS — F988 Other specified behavioral and emotional disorders with onset usually occurring in childhood and adolescence: Secondary | ICD-10-CM | POA: Insufficient documentation

## 2015-02-02 NOTE — Progress Notes (Signed)
F/u for hep B #3--see hx Hep c neg  Patient Active Problem List   Diagnosis Date Noted  . ADD (attention deficit disorder)---Dr Toy Care rx 02/02/2015  . Insomnia 04/08/2012  . Substance abuse in remission---great!! 04/08/2012  . GAD (generalized anxiety disorder)--celexa and xanax doing well 11/02/2011  . Chronic back pain 11/02/2011   Doing extremely well [employer] business very successful  Current outpatient prescriptions:  .  alprazolam (XANAX) 2 MG tablet, TAKE 1 TABLET BY MOUTH 3 TIMES DAILY AS NEEDED FOR ANXIETY, Disp: 90 tablet, Rfl: 4 .  amphetamine-dextroamphetamine (ADDERALL) 20 MG tablet, Take 20 mg by mouth 3 (three) times daily., Disp: , Rfl: 0 .  citalopram (CELEXA) 40 MG tablet, Take 1 tablet (40 mg total) by mouth daily., Disp: 90 tablet, Rfl: 2 .  hydrOXYzine (VISTARIL) 25 MG capsule, , Disp: , Rfl: 5 .  sildenafil (REVATIO) 20 MG tablet, TAKE 2.5 TO 5 TABLET BY MOUTH EVERY DAY AS NEEDED FOR ERECTILE DYSFUNCTION, Disp: 300 tablet, Rfl: 0 .  SUBOXONE 8-2 MG FILM, , Disp: , Rfl: 0  ROS -BP elevated 2 mos at drugstore-no dx in past MOM and Dad w/ htn No CV sxt  exan BP 142/92 mmHg  Pulse 74  Temp(Src) 98.4 F (36.9 C)  Resp 16  Ht 5\' 10"  (1.778 m)  Wt 210 lb (95.255 kg)  BMI 30.13 kg/m2 Ht reg w/out M extr clear Mood good/aff appr  Imp Need for hepatitis B vaccination #3  Anxiety-refills//will have Dr Toy Care take over rx when I retire  Elevated BP-home BP cuff --low salt diet -10-15 lbs wt loss Ck 3-4 mos Meds ordered this encounter  Medications  . citalopram (CELEXA) 40 MG tablet    Sig: Take 1 tablet (40 mg total) by mouth daily.    Dispense:  90 tablet    Refill:  2    This prescription was filled today(01/19/2015). Any refills authorized will be placed on file.

## 2015-03-01 ENCOUNTER — Ambulatory Visit: Payer: Self-pay | Admitting: Internal Medicine

## 2015-05-22 ENCOUNTER — Other Ambulatory Visit: Payer: Self-pay | Admitting: Internal Medicine

## 2015-05-23 NOTE — Telephone Encounter (Signed)
Faxed

## 2015-05-23 NOTE — Telephone Encounter (Signed)
Meds ordered this encounter  Medications  . alprazolam (XANAX) 2 MG tablet    Sig: TAKE 1 TABLET BY MOUTH 3 TIMES DAILY AS NEEDED FOR ANXIETY    Dispense:  90 tablet    Refill:  5    This prescription was filled today(05/22/2015). Any refills authorized will be placed on file.

## 2015-05-24 ENCOUNTER — Ambulatory Visit (INDEPENDENT_AMBULATORY_CARE_PROVIDER_SITE_OTHER): Payer: BLUE CROSS/BLUE SHIELD | Admitting: Internal Medicine

## 2015-05-24 VITALS — BP 131/87 | HR 101 | Temp 97.7°F | Resp 16 | Ht 70.0 in | Wt 201.0 lb

## 2015-05-24 DIAGNOSIS — F411 Generalized anxiety disorder: Secondary | ICD-10-CM

## 2015-05-24 DIAGNOSIS — N529 Male erectile dysfunction, unspecified: Secondary | ICD-10-CM | POA: Diagnosis not present

## 2015-05-24 DIAGNOSIS — F1910 Other psychoactive substance abuse, uncomplicated: Secondary | ICD-10-CM | POA: Diagnosis not present

## 2015-05-24 DIAGNOSIS — F1911 Other psychoactive substance abuse, in remission: Secondary | ICD-10-CM

## 2015-05-24 MED ORDER — CITALOPRAM HYDROBROMIDE 40 MG PO TABS: 40.0000 mg | ORAL_TABLET | Freq: Every day | ORAL | Status: DC

## 2015-05-24 MED ORDER — SILDENAFIL CITRATE 20 MG PO TABS: ORAL_TABLET | ORAL | Status: DC

## 2015-05-24 MED ORDER — ALPRAZOLAM 2 MG PO TABS: ORAL_TABLET | ORAL | Status: DC

## 2015-05-24 NOTE — Patient Instructions (Addendum)
Philis Fendt PA-C    IF you received an x-ray today, you will receive an invoice from [facility]. Please contact [facility] at [phone] with questions or concerns regarding your invoice.   IF you received labwork today, you will receive an invoice from [employer]. Please contact [facility] at [phone] with questions or concerns regarding your invoice.   Our billing staff will not be able to assist you with questions regarding bills from these companies.  You will be contacted with the lab results as soon as they are available. The fastest way to get your results is to activate your My Chart account. Instructions are located on the last page of this paperwork. If you have not heard from [place] regarding the results in 2 weeks, please contact this office.

## 2015-05-26 NOTE — Progress Notes (Signed)
Follow-up for medication renewal Patient Active Problem List   Diagnosis Date Noted  . ADD (attention deficit disorder) 02/02/2015  . Insomnia 04/08/2012  . Substance abuse in remission 04/08/2012  . GAD (generalized anxiety disorder) 11/02/2011  . Chronic back pain 11/02/2011   He continues to do extremely well. 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 His relationship is continuing to be stable He follows regularly with Dr.Kaur who prescribes Suboxone and Adderall His anxiety continues to respond well to Celexa and Xanax as prescribed by me. As I retire his psychiatrist has agreed to take over prescribing these medicines for him Sildenafil continues to work for his erectile dysfunction--- we discussed the role of self doubt with ED He has a significant childhood history of turmoil in his relationship with his father that probably is responsible for his path into the world drugs and addiction. He is very happy that he has complete control of this in recent years although admits that he still has considerable self-doubt in comparison to others which he needs to work on with counseling.  IMP GAD (generalized anxiety disorder)  Erectile dysfunction, unspecified erectile dysfunction type  Substance abuse in remission  Meds ordered this encounter  Medications  . DISCONTD: sildenafil (REVATIO) 20 MG tablet    Sig: TAKE 2.5 TO 5 TABLET BY MOUTH EVERY DAY AS NEEDED FOR ERECTILE DYSFUNCTION    Dispense:  300 tablet    Refill:  5  . alprazolam (XANAX) 2 MG tablet    Sig: TAKE 1 TABLET BY MOUTH 3 TIMES DAILY AS NEEDED FOR ANXIETY    Dispense:  90 tablet    Refill:  5    This prescription was filled today(05/22/2015). Any refills authorized will be placed on file.  . citalopram (CELEXA) 40 MG tablet    Sig: Take 1 tablet (40 mg total) by mouth daily.    Dispense:  90 tablet    Refill:  2    This prescription was filled today(01/19/2015). Any refills authorized will be placed  on file.  . sildenafil (REVATIO) 20 MG tablet    Sig: TAKE 2.5 TO 5 TABLET BY MOUTH EVERY DAY AS NEEDED FOR ERECTILE DYSFUNCTION    Dispense:  300 tablet    Refill:  5    Printed version of electronic   F/u Dr Toy Care and here prn for medical Issues with Rhetta Mura, PA-C

## 2015-06-07 ENCOUNTER — Ambulatory Visit: Payer: Self-pay | Admitting: Internal Medicine

## 2015-12-14 ENCOUNTER — Other Ambulatory Visit: Payer: Self-pay

## 2015-12-14 NOTE — Telephone Encounter (Signed)
ast ov and refill 05/2015 with 5 refills

## 2015-12-19 ENCOUNTER — Other Ambulatory Visit: Payer: Self-pay

## 2015-12-19 NOTE — Telephone Encounter (Signed)
05/2015 last ov and refill with 5 additional

## 2015-12-21 MED ORDER — ALPRAZOLAM 2 MG PO TABS: ORAL_TABLET | ORAL | 0 refills | Status: AC

## 2015-12-21 NOTE — Telephone Encounter (Signed)
I have given the patient a refill for a month - please remind her that she needs to pick a new PCP and make an appt to see that person.

## 2015-12-22 NOTE — Telephone Encounter (Signed)
Called in Rx. Spoke to pt who reported that his psychiatrist is supposed to be managing this med for him now and will have the pharm send req to him next month. Advised pt to call and set up appt for new PCP if her would like to see someone here.

## 2016-04-04 ENCOUNTER — Encounter (HOSPITAL_COMMUNITY): Payer: Self-pay

## 2016-04-04 ENCOUNTER — Emergency Department (HOSPITAL_COMMUNITY)
Admission: EM | Admit: 2016-04-04 | Discharge: 2016-04-04 | Disposition: A | Discharge status: Home / Self Care | Payer: BLUE CROSS/BLUE SHIELD | Attending: Emergency Medicine | Admitting: Emergency Medicine

## 2016-04-04 ENCOUNTER — Emergency Department (HOSPITAL_COMMUNITY): Payer: BLUE CROSS/BLUE SHIELD

## 2016-04-04 DIAGNOSIS — S91311A Laceration without foreign body, right foot, initial encounter: Secondary | ICD-10-CM | POA: Diagnosis not present

## 2016-04-04 DIAGNOSIS — W25XXXA Contact with sharp glass, initial encounter: Secondary | ICD-10-CM | POA: Diagnosis not present

## 2016-04-04 DIAGNOSIS — Z87891 Personal history of nicotine dependence: Secondary | ICD-10-CM | POA: Diagnosis not present

## 2016-04-04 DIAGNOSIS — Y999 Unspecified external cause status: Secondary | ICD-10-CM | POA: Insufficient documentation

## 2016-04-04 DIAGNOSIS — Y939 Activity, unspecified: Secondary | ICD-10-CM | POA: Insufficient documentation

## 2016-04-04 DIAGNOSIS — Z79899 Other long term (current) drug therapy: Secondary | ICD-10-CM | POA: Diagnosis not present

## 2016-04-04 DIAGNOSIS — Y929 Unspecified place or not applicable: Secondary | ICD-10-CM | POA: Diagnosis not present

## 2016-04-04 MED ORDER — OXYCODONE-ACETAMINOPHEN 5-325 MG PO TABS
1.0000 | ORAL_TABLET | Freq: Once | ORAL | Status: AC
  Administered 2016-04-04: 1 via ORAL
  Filled 2016-04-04: qty 1

## 2016-04-04 MED ORDER — TETANUS-DIPHTH-ACELL PERTUSSIS 5-2.5-18.5 LF-MCG/0.5 IM SUSP
0.5000 mL | Freq: Once | INTRAMUSCULAR | Status: AC
  Administered 2016-04-04: 0.5 mL via INTRAMUSCULAR
  Filled 2016-04-04: qty 0.5

## 2016-04-04 MED ORDER — OXYCODONE-ACETAMINOPHEN 5-325 MG PO TABS: 1.0000 | ORAL_TABLET | ORAL | 0 refills | Status: DC | PRN

## 2016-04-04 MED ORDER — LIDOCAINE-EPINEPHRINE (PF) 2 %-1:200000 IJ SOLN
20.0000 mL | Freq: Once | INTRAMUSCULAR | Status: AC
  Administered 2016-04-04: 20 mL via INTRADERMAL
  Filled 2016-04-04: qty 20

## 2016-04-04 MED ORDER — DOXYCYCLINE HYCLATE 100 MG PO CAPS: 100.0000 mg | ORAL_CAPSULE | Freq: Two times a day (BID) | ORAL | 0 refills | Status: DC

## 2016-04-04 NOTE — ED Notes (Signed)
Patient transported to X-ray 

## 2016-04-04 NOTE — ED Provider Notes (Signed)
LACERATION REPAIR Performed by: Larene Pickett Authorized by: Larene Pickett Consent: Verbal consent obtained. Risks and benefits: risks, benefits and alternatives were discussed Consent given by: patient Patient identity confirmed: provided demographic data Prepped and Draped in normal sterile fashion Wound explored  Laceration Location: right foot, sole  Laceration Length: 8 cm  2 glass shards removed from wound in right foot  Anesthesia: local infiltration  Local anesthetic: lidocaine 2% with epinephrine  Anesthetic total: 10 ml  Irrigation method: syringe Amount of cleaning: standard  Skin closure: 3-0 prolene  Number of sutures: 10  Technique: simple interrupted  Patient tolerance: Patient tolerated the procedure well with no immediate complications.      Larene Pickett, PA-C 04/04/16 [place], MD 04/04/16 [phone]

## 2016-04-04 NOTE — ED Triage Notes (Signed)
Pt endorses stepping onto a glass tumbler, the glass broke and the pt stepped on the broken glass causing a large laceration on the bottom of the pt's right foot. Blood observed to be coming out of laceration heavily in triage. Wrapped with ABD pads and wrapped in coban, bleeding controlled. Pt admits to heavy etoh use this evening. CMS in tact.

## 2016-04-04 NOTE — ED Notes (Signed)
Pt repeatedly crying out from the room. Found standing in hallway. Says it feels better when he stands on it. Repeatedly asked pt to return to stretcher, emphasizing that leaving the foot in a dependent position will only incr the swelling and bleeding d/t gravity.

## 2016-04-04 NOTE — Discharge Instructions (Signed)
Follow-up at urgent care in 10 days for suture removal.

## 2016-04-04 NOTE — ED Provider Notes (Signed)
[facility] DEPT Provider Note   CSN: [id number] Arrival date & time: 04/04/16  0133 By signing my name below, I, Dyke Brackett, attest that this documentation has been prepared under the direction and in the presence of Orpah Greek, MD . Electronically Signed: Dyke Brackett, Scribe. 04/04/2016. 3:23 AM.   History   Chief Complaint Chief Complaint  Patient presents with  . Extremity Laceration   HPI [name] is a 46 y.o. male who presents to the Emergency Department complaining of a laceration to right foot sustained today PTA. Pt states he stepped on a glass tumbler, causing the glass to break. He then stepped on the broken glass, resulting in a large laceration to the bottom of pt's right foot. Extremity is wrapped and bleeding is controlled upon exam. He notes associated sudden onset, moderate pain to the area. No alleviating or modifying factors noted. Pt endorses heavy EtOH use tonight. Tetanus status unknown. Pt has no other complaints or symptoms at this time.   The history is provided by the patient. No language interpreter was used.   Past Medical History:  Diagnosis Date  . Anxiety   . Depression   . Kidney stones   . MRSA (methicillin resistant staph aureus) culture positive   . Substance abuse    former abuser methodone and oxycodone    Patient Active Problem List   Diagnosis Date Noted  . ADD (attention deficit disorder) 02/02/2015  . Insomnia 04/08/2012  . Substance abuse in remission 04/08/2012  . GAD (generalized anxiety disorder) 11/02/2011  . Chronic back pain 11/02/2011    Past Surgical History:  Procedure Laterality Date  . kidney stones      Home Medications    Prior to Admission medications   Medication Sig Start Date End Date Taking? Authorizing Provider  alprazolam Duanne Moron) 2 MG tablet TAKE 1 TABLET BY MOUTH 3 TIMES DAILY AS NEEDED FOR ANXIETY 12/21/15   Mancel Bale, PA-C  amphetamine-dextroamphetamine (ADDERALL) 20 MG  tablet Take 20 mg by mouth 3 (three) times daily. 01/31/14   Historical Provider, MD  citalopram (CELEXA) 40 MG tablet Take 1 tablet (40 mg total) by mouth daily. 05/24/15   Leandrew Koyanagi, MD  doxycycline (VIBRAMYCIN) 100 MG capsule Take 1 capsule (100 mg total) by mouth 2 (two) times daily. 04/04/16   Orpah Greek, MD  hydrOXYzine (VISTARIL) 25 MG capsule  12/24/13   Historical Provider, MD  oxyCODONE-acetaminophen (PERCOCET) 5-325 MG tablet Take 1-2 tablets by mouth every 4 (four) hours as needed. 04/04/16   Orpah Greek, MD  sildenafil (REVATIO) 20 MG tablet TAKE 2.5 TO 5 TABLET BY MOUTH EVERY DAY AS NEEDED FOR ERECTILE DYSFUNCTION 05/24/15   Leandrew Koyanagi, MD  SUBOXONE 8-2 MG FILM  01/31/14   Historical Provider, MD    Family History Family History  Problem Relation Age of Onset  . Hypertension Mother   . Cancer Maternal Grandmother     ovarian  . Diabetes Paternal Grandfather     Social History Social History  Substance Use Topics  . Smoking status: Former Smoker    Packs/day: 0.00    Years: 26.00    Quit date: 04/05/2015  . Smokeless tobacco: Never Used  . Alcohol use 0.0 oz/week     Comment: 3 times per week     Allergies   Codeine and Tylenol [acetaminophen]   Review of Systems Review of Systems 10 systems reviewed and all are negative for acute change except as noted  in the HPI.   Physical Exam Updated Vital Signs BP 125/84   Pulse 93   Temp 98.9 F (37.2 C) (Oral)   Resp 18   Ht 5\' 10"  (1.778 m)   Wt 200 lb (90.7 kg)   SpO2 95%   BMI 28.70 kg/m   Physical Exam  Constitutional: He is oriented to person, place, and time. He appears well-developed and well-nourished. No distress.  HENT:  Head: Normocephalic and atraumatic.  Right Ear: Hearing normal.  Left Ear: Hearing normal.  Nose: Nose normal.  Mouth/Throat: Oropharynx is clear and moist and mucous membranes are normal.  Eyes: Conjunctivae and EOM are normal. Pupils are  equal, round, and reactive to light.  Neck: Normal range of motion. Neck supple.  Cardiovascular: Regular rhythm, S1 normal and S2 normal.  Exam reveals no gallop and no friction rub.   No murmur heard. Pulmonary/Chest: Effort normal and breath sounds normal. No respiratory distress. He exhibits no tenderness.  Abdominal: Soft. Normal appearance and bowel sounds are normal. There is no hepatosplenomegaly. There is no tenderness. There is no rebound, no guarding, no tenderness at McBurney's point and negative Murphy's sign. No hernia.  Musculoskeletal: Normal range of motion.  Neurological: He is alert and oriented to person, place, and time. He has normal strength. No cranial nerve deficit or sensory deficit. Coordination normal. GCS eye subscore is 4. GCS verbal subscore is 5. GCS motor subscore is 6.  Skin: Skin is warm, dry and intact. No rash noted. No cyanosis.  Psychiatric: He has a normal mood and affect. His speech is normal and behavior is normal. Thought content normal.  Nursing note and vitals reviewed.  ED Treatments / Results  DIAGNOSTIC STUDIES:  Oxygen Saturation is 96% on RA, adequate by my interpretation.    COORDINATION OF CARE:  3:15 AM Discussed treatment plan with pt at bedside and pt agreed to plan.  Labs (all labs ordered are listed, but only abnormal results are displayed) Labs Reviewed - No data to display  EKG  EKG Interpretation None       Radiology Dg Foot Complete Right  Result Date: 04/04/2016 CLINICAL DATA:  Stepped on  glass with laceration EXAM: RIGHT FOOT COMPLETE - 3+ VIEW COMPARISON:  None. FINDINGS: Examination is limited by artifact. No fracture or malalignment. There are 2 radiopaque foreign bodies along the anterior plantar surface of the foot, the larger foreign body measures 11 mm and overlies the third proximal phalanx. The smaller foreign body measures 7 mm and overlies the fourth MTP joint. IMPRESSION: No fracture. Two radiopaque foreign  bodies within the anterior plantar soft tissues. Electronically Signed   By: Donavan Foil M.D.   On: 04/04/2016 03:58    Procedures Procedures (including critical care time)  Medications Ordered in ED Medications  oxyCODONE-acetaminophen (PERCOCET/ROXICET) 5-325 MG per tablet 1 tablet (1 tablet Oral Given 04/04/16 0327)  Tdap (BOOSTRIX) injection 0.5 mL (0.5 mLs Intramuscular Given 04/04/16 0330)  lidocaine-EPINEPHrine (XYLOCAINE W/EPI) 2 %-1:200000 (PF) injection 20 mL (20 mLs Intradermal Given 04/04/16 0416)     Initial Impression / Assessment and Plan / ED Course  I have reviewed the triage vital signs and the nursing notes.  Pertinent labs & imaging results that were available during my care of the patient were reviewed by me and considered in my medical decision making (see chart for details).     Patient presented for evaluation of large laceration on the plantar aspect of his foot after stepping on a drinking  glass. X-ray did show 2 foreign bodies. These were easily located in the wound and removed with forceps. Wound was repaired and patient will be placed on empiric antibiotic coverage. 10 day suture removal. Return precautions given for signs of infection.  Final Clinical Impressions(s) / ED Diagnoses   Final diagnoses:  Laceration of right foot, initial encounter    New Prescriptions New Prescriptions   DOXYCYCLINE (VIBRAMYCIN) 100 MG CAPSULE    Take 1 capsule (100 mg total) by mouth 2 (two) times daily.   OXYCODONE-ACETAMINOPHEN (PERCOCET) 5-325 MG TABLET    Take 1-2 tablets by mouth every 4 (four) hours as needed.  I personally performed the services described in this documentation, which was scribed in my presence. The recorded information has been reviewed and is accurate.    Orpah Greek, MD 04/04/16 [phone]

## 2016-04-04 NOTE — ED Notes (Signed)
Poll[place], MD successfully removed two foreign bodies from within lacerated area of pt's R foot. Foot placed in providone-NS bath prior to irrigation and suturing.

## 2016-04-26 ENCOUNTER — Ambulatory Visit (HOSPITAL_COMMUNITY)
Admission: EM | Admit: 2016-04-26 | Discharge: 2016-04-26 | Disposition: A | Discharge status: Home / Self Care | Payer: BLUE CROSS/BLUE SHIELD

## 2016-06-03 ENCOUNTER — Telehealth: Payer: Self-pay

## 2016-06-03 ENCOUNTER — Ambulatory Visit: Payer: BLUE CROSS/BLUE SHIELD

## 2016-06-03 ENCOUNTER — Encounter: Payer: Self-pay | Admitting: Family Medicine

## 2016-06-03 NOTE — Telephone Encounter (Signed)
Pt is needing to know if [facility] will refill his erectiale disfunction medicatin or does he need to come in for a appt first   [facility] number [phone]

## 2016-06-03 NOTE — Telephone Encounter (Signed)
Patient needs appointment for refills

## 2016-06-06 ENCOUNTER — Ambulatory Visit: Payer: BLUE CROSS/BLUE SHIELD

## 2016-07-02 ENCOUNTER — Ambulatory Visit (INDEPENDENT_AMBULATORY_CARE_PROVIDER_SITE_OTHER): Payer: BLUE CROSS/BLUE SHIELD | Admitting: Physician Assistant

## 2016-07-02 ENCOUNTER — Encounter: Payer: Self-pay | Admitting: Physician Assistant

## 2016-07-02 VITALS — BP 134/88 | HR 85 | Temp 97.9°F | Resp 18 | Ht 69.09 in | Wt 195.0 lb

## 2016-07-02 DIAGNOSIS — N522 Drug-induced erectile dysfunction: Secondary | ICD-10-CM | POA: Diagnosis not present

## 2016-07-02 MED ORDER — SILDENAFIL CITRATE 20 MG PO TABS: ORAL_TABLET | ORAL | 0 refills | Status: DC

## 2016-07-02 NOTE — Progress Notes (Signed)
  07/05/2016 3:56 PM   DOB: Jan 28, 1971 / MRN: [id number]  SUBJECTIVE:  [name] is a 46 y.o. male presenting for sildenafil.  Says that he takes five a day of these.  He has been taking sildenafil 100 mg nightly and has been doing so for years. Tells me the erection about an hour.  He has never had an erection lasting longer than 4 hours.  Tells me that without the Viagra he is able to have an erection but will not have an orgasm. He does want to have sex everyday.    He is allergic to codeine and tylenol [acetaminophen].   He  has a past medical history of Anxiety; Depression; Kidney stones; MRSA (methicillin resistant staph aureus) culture positive; and Substance abuse.    He  reports that he quit smoking about 15 months ago. He smoked 0.00 packs per day for 26.00 years. He has never used smokeless tobacco. He reports that he drinks alcohol. He reports that he uses drugs, including Marijuana. He  has no sexual activity history on file. The patient  has a past surgical history that includes kidney stones.  His family history includes Cancer in his maternal grandmother; Diabetes in his paternal grandfather; Hypertension in his mother.  Review of Systems  Constitutional: Negative for chills and fever.  Respiratory: Negative for cough.   Skin: Negative for rash.  Neurological: Negative for dizziness.  Psychiatric/Behavioral: Negative for depression.    The problem list and medications were reviewed and updated by myself where necessary and exist elsewhere in the encounter.   OBJECTIVE:  BP 134/88   Pulse 85   Temp 97.9 F (36.6 C) (Oral)   Resp 18   Ht 5' 9.09" (1.755 m)   Wt 195 lb (88.5 kg)   SpO2 100%   BMI 28.72 kg/m   Physical Exam  Constitutional: He is oriented to person, place, and time. He appears well-developed and well-nourished.  Cardiovascular: Regular rhythm.   Pulmonary/Chest: Effort normal and breath sounds normal.  Musculoskeletal: Normal range of  motion.  Neurological: He is alert and oriented to person, place, and time.    Lab Results  Component Value Date   CREATININE 0.84 05/12/2013   BUN 20 05/12/2013   NA 138 05/12/2013   K 4.2 05/12/2013   CL 105 05/12/2013   CO2 25 05/12/2013    ASSESSMENT AND PLAN:  [name] was seen today for medication refill.  Diagnoses and all orders for this visit:  Drug-induced erectile dysfunction: I have spoken to a collegue in urology who tells me that 300 tabs monthly is not safe.  He writes, at maximum, 50 tabs monthly.  He also feels that the patient should discuss his difficulty with ejaculation as the viagra is certainly not helping him with this.  -     sildenafil (REVATIO) 20 MG tablet; Take 0.5 to 2 tabs on an empty stomach 1 hour before intimacy. Use lowest possible dose.  Do not combine with benzodiazepines or alcohol.  Other orders -     Discontinue: sildenafil (REVATIO) 20 MG tablet; TAKE 2.5 TO 5 TABLET BY MOUTH EVERY DAY AS NEEDED FOR ERECTILE DYSFUNCTION    The patient is advised to call or return to clinic if he does not see an improvement in symptoms, or to seek the care of the closest emergency department if he worsens with the above plan.   Philis Fendt, MHS, PA-C [facility] 07/05/2016 3:56 PM

## 2016-07-02 NOTE — Patient Instructions (Signed)
     IF you received an x-ray today, you will receive an invoice from [facility]. Please contact [facility] at [phone] with questions or concerns regarding your invoice.   IF you received labwork today, you will receive an invoice from [facility]. Please contact [facility] at [phone] with questions or concerns regarding your invoice.   Our billing staff will not be able to assist you with questions regarding bills from these companies.  You will be contacted with the lab results as soon as they are available. The fastest way to get your results is to activate your My Chart account. Instructions are located on the last page of this paperwork. If you have not heard from us regarding the results in 2 weeks, please contact this office.     

## 2016-07-05 MED ORDER — SILDENAFIL CITRATE 20 MG PO TABS: ORAL_TABLET | ORAL | 11 refills | Status: DC

## 2017-05-05 LAB — LIPID PANEL
CHOLESTEROL: 177 (ref 0–200)
HDL: 47 (ref 35–70)
LDL Cholesterol: 86
TRIGLYCERIDES: 217 — AB (ref 40–160)

## 2017-05-05 LAB — HEPATIC FUNCTION PANEL
ALK PHOS: 96 (ref 25–125)
ALT: 17 (ref 10–40)
AST: 15 (ref 14–40)

## 2017-05-05 LAB — CBC AND DIFFERENTIAL
HEMATOCRIT: 47 (ref 41–53)
Hemoglobin: 15.5 (ref 13.5–17.5)
Platelets: 275 (ref 150–399)
WBC: 9.7

## 2017-05-05 LAB — BASIC METABOLIC PANEL
BUN: 17 (ref 4–21)
Creatinine: 1.3 (ref 0.6–1.3)
Potassium: 3.9 (ref 3.4–5.3)
SODIUM: 139 (ref 137–147)

## 2017-05-08 ENCOUNTER — Encounter: Payer: Self-pay | Admitting: Family Medicine

## 2017-05-08 ENCOUNTER — Ambulatory Visit: Payer: BLUE CROSS/BLUE SHIELD | Admitting: Family Medicine

## 2017-05-08 VITALS — BP 130/80 | HR 88 | Temp 97.5°F | Ht 70.0 in | Wt 212.0 lb

## 2017-05-08 DIAGNOSIS — H6690 Otitis media, unspecified, unspecified ear: Secondary | ICD-10-CM

## 2017-05-08 DIAGNOSIS — M79605 Pain in left leg: Secondary | ICD-10-CM | POA: Diagnosis not present

## 2017-05-08 MED ORDER — AMOXICILLIN 875 MG PO TABS: 875.0000 mg | ORAL_TABLET | Freq: Two times a day (BID) | ORAL | 0 refills | Status: DC

## 2017-05-08 MED ORDER — PREDNISONE 50 MG PO TABS: ORAL_TABLET | ORAL | 0 refills | Status: DC

## 2017-05-08 NOTE — Patient Instructions (Addendum)
Please start the amoxicilin and prednisone.  I need to get your medical records.  We will call you with next steps once we get your records.  Take care, Dr Jerline Pain

## 2017-05-08 NOTE — Progress Notes (Signed)
Subjective:  [name] is a 47 y.o. male who presents today with a chief complaint of ear pain and to establish care.   HPI:  Ear Pain, acute issue  Started 5 days ago.  Located bilaterally however more severe than the left.  [place] he thinks that this may have worsened his symptoms.  Seen at urgent care last given eardrops which have not helped.  No fevers or chills.  Leg Pain, chronic problem, new to this provider Symptoms started 4 to 6 months ago.  Pain located along the lateral aspect of his left lower leg.  Reports that he has had full work-up done at a local urgent care including blood work and an MRI which were all negative.  Denies any pain in his upper leg or his foot.  No loss of sensation.  No weakness.  He took some Vicodin that his mother gave him which helped a little bit with the pain.  No obvious trauma.  No other obvious precipitating events.  Pain is so severe that he reports that it is interfering with his daily activities and causing him to be very forgetful.  Patient also reports that the pain is so severe that it interferes with his personal relationships.  ROS: Per HPI, otherwise a complete review of systems was negative.   PMH:  The following were reviewed and entered/updated in epic: Past Medical History:  Diagnosis Date  . Anxiety   . Depression   . Kidney stones   . MRSA (methicillin resistant staph aureus) culture positive   . Substance abuse ([place])    former abuser methodone and oxycodone   Patient Active Problem List   Diagnosis Date Noted  . ADD (attention deficit disorder) 02/02/2015  . Insomnia 04/08/2012  . Substance abuse in remission ([place]) 04/08/2012  . GAD (generalized anxiety disorder) 11/02/2011  . Chronic back pain 11/02/2011   Past Surgical History:  Procedure Laterality Date  . kidney stones      Family History  Problem Relation Age of Onset  . Hypertension Mother   . Cancer Maternal Grandmother          ovarian  . Diabetes Paternal Grandfather     Medications- reviewed and updated Current Outpatient Medications  Medication Sig Dispense Refill  . alprazolam (XANAX) 2 MG tablet TAKE 1 TABLET BY MOUTH 3 TIMES DAILY AS NEEDED FOR ANXIETY 90 tablet 0  . amphetamine-dextroamphetamine (ADDERALL) 20 MG tablet Take 20 mg by mouth 3 (three) times daily.  0  . citalopram (CELEXA) 40 MG tablet Take 1 tablet (40 mg total) by mouth daily. 90 tablet 2  . losartan (COZAAR) 25 MG tablet Take 25 mg by mouth daily.    . sildenafil (REVATIO) 20 MG tablet Take 0.5 to 2 tabs on an empty stomach 1 hour before intimacy. Use lowest possible dose.  Do not combine with benzodiazepines or alcohol. 50 tablet 11  . amoxicillin (AMOXIL) 875 MG tablet Take 1 tablet (875 mg total) by mouth 2 (two) times daily. 14 tablet 0  . predniSONE (DELTASONE) 50 MG tablet Take 1 tablet daily for 7 days. 7 tablet 0   No current facility-administered medications for this visit.     Allergies-reviewed and updated Allergies  Allergen Reactions  . Codeine Itching and Nausea Only  . Tylenol [Acetaminophen] Other (See Comments)    headache    Social History   Socioeconomic History  . Marital status: Single    Spouse name:  Not on file  . Number of children: Not on file  . Years of education: Not on file  . Highest education level: Not on file  Occupational History  . Not on file  Social Needs  . Financial resource strain: Not on file  . Food insecurity:    Worry: Not on file    Inability: Not on file  . Transportation needs:    Medical: Not on file    Non-medical: Not on file  Tobacco Use  . Smoking status: Light Tobacco Smoker    Packs/day: 0.00    Years: 26.00    Pack years: 0.00    Last attempt to quit: 04/05/2015    Years since quitting: 2.0  . Smokeless tobacco: Never Used  Substance and Sexual Activity  . Alcohol use: Yes    Alcohol/week: 0.0 oz    Comment: 3 times per week  . Drug use: Yes     Types: Marijuana    Comment: once per month  . Sexual activity: Not on file  Lifestyle  . Physical activity:    Days per week: Not on file    Minutes per session: Not on file  . Stress: Not on file  Relationships  . Social connections:    Talks on phone: Not on file    Gets together: Not on file    Attends religious service: Not on file    Active member of club or organization: Not on file    Attends meetings of clubs or organizations: Not on file    Relationship status: Not on file  Other Topics Concern  . Not on file  Social History Narrative  . Not on file   Objective:  Physical Exam: BP 130/80   Pulse 88   Temp (!) 97.5 F (36.4 C)   Ht 5\' 10"  (1.778 m)   Wt 212 lb (96.2 kg)   SpO2 98%   BMI 30.42 kg/m   Gen: NAD, resting comfortably HEENT: Right and left TM erythematous with clear effusion. CV: RRR with no murmurs appreciated Pulm: NWOB, CTAB with no crackles, wheezes, or rhonchi GI: Normal bowel sounds present. Soft, Nontender, Nondistended. MSK:  -Left lower extremity: No deformities.  Exquisitely tender to palpation along lateral aspect of left lower leg.  No pain with resisted foot dorsiflexion or plantarflexion.  Distal pulses intact.  Sensation light touch intact throughout.  Strength 5 out of 5 throughout.  Straight leg raise negative. Skin: Warm, dry Neuro: Grossly normal, moves all extremities Psych: Normal affect and thought content  Assessment/Plan:  Acute otitis media Start amoxicillin.   Left leg pain Unclear etiology.  He reportedly had full work-up at an urgent care which was negative.  We will obtain records for both his blood work and MRI.  Patient reports that he was tested for blood clot which was negative.  Patient requested narcotics for his leg pain today.  Patient has a history of substance abuse and is currently prescribed Suboxone.  Of note, patient denied taking Suboxone and said that he has been off for several months.  Per his  controlled substance database, this was last filled on 04/23/2017-15 days ago.  Patient said that there must have been a mixup and that he would check with his pharmacy.  He is also on Adderall and Xanax that are prescribed by his psychiatrist.  I advised patient that I would be unable to give him narcotics.  Patient voiced understanding.  We will start a 1 week course  of prednisone.  Discussed reasons to return to care and seek care emergently.  May need referral to orthopedics depending on work-up that has been done already.  Algis Greenhouse. Jerline Pain, MD 05/08/2017 5:05 PM

## 2017-05-12 ENCOUNTER — Other Ambulatory Visit: Payer: Self-pay

## 2017-05-12 ENCOUNTER — Telehealth: Payer: Self-pay | Admitting: Family Medicine

## 2017-05-12 NOTE — Telephone Encounter (Unsigned)
Copied from [place] [id number]. Topic: Quick Communication - See Telephone Encounter >> May 12, 2017  1:55 PM Neva Seat wrote: Pt is asking if his ultra sound has come from [facility] to Dr. Jerline Pain? He is needing to find out and get an appt w/ Dr. Jerline Pain to get his pain in his leg and foot resolved. Please call pt asap to let him know and what the next steps will be to get help.

## 2017-05-16 ENCOUNTER — Encounter: Payer: Self-pay | Admitting: Physical Therapy

## 2017-05-22 ENCOUNTER — Encounter: Payer: Self-pay | Admitting: Family Medicine

## 2017-05-22 ENCOUNTER — Telehealth: Payer: Self-pay | Admitting: Family Medicine

## 2017-05-22 ENCOUNTER — Ambulatory Visit (INDEPENDENT_AMBULATORY_CARE_PROVIDER_SITE_OTHER): Payer: BLUE CROSS/BLUE SHIELD

## 2017-05-22 ENCOUNTER — Ambulatory Visit: Payer: BLUE CROSS/BLUE SHIELD | Admitting: Family Medicine

## 2017-05-22 VITALS — BP 120/82 | HR 98 | Temp 97.8°F | Resp 14 | Ht 70.0 in | Wt 208.0 lb

## 2017-05-22 DIAGNOSIS — M549 Dorsalgia, unspecified: Secondary | ICD-10-CM

## 2017-05-22 DIAGNOSIS — M79605 Pain in left leg: Secondary | ICD-10-CM

## 2017-05-22 MED ORDER — GABAPENTIN 300 MG PO CAPS: 300.0000 mg | ORAL_CAPSULE | Freq: Every day | ORAL | 3 refills | Status: DC

## 2017-05-22 NOTE — Telephone Encounter (Signed)
Please advise 

## 2017-05-22 NOTE — Progress Notes (Signed)
   Subjective:  [name] is a 47 y.o. male who presents today with a chief complaint of left leg pain.   HPI:  Left Leg Pain, established problem, uncontrolled.  Seen 2 weeks ago for this. Started on prednisone which has helped some. Pain is about the same otherwise.  Occasionally has low back pain. Sometimes has pain radiating down lateral aspect of left leg.   AOM, Established problem,  Patient also seen for this about 2 weeks ago.  Was started on amoxicillin.  Symptoms have essentially resolved.  ROS: Per HPI  Objective:  Physical Exam: BP 120/82 (BP Location: Right Arm)   Pulse 98   Temp 97.8 F (36.6 C) (Oral)   Resp 14   Ht 5\' 10"  (1.778 m)   Wt 208 lb (94.3 kg)   SpO2 97%   BMI 29.84 kg/m   Gen: NAD, resting comfortably MSK: -Left leg: No deformities.  Nontender to palpation.  Strength 5 out of 5 throughout.  Straight leg raise negative. -Low back: Nontender to palpation.  Subtle masses noted on paraspinal muscle groups bilaterally.  Assessment/Plan:  Otitis Media Resolved.  Left Leg Unclear etiology.  May have some radicular pathology given his occasional low back pain.  We still do not have records of workup that has been done thus far.  We will check plain films today. Will start gabapentin 300mg  nightly to help with his radicular symptoms. Consider referral to sports medicine and/or PT if not improving on gabapentin.   Algis Greenhouse. Jerline Pain, MD 05/22/2017 1:42 PM

## 2017-05-22 NOTE — Patient Instructions (Addendum)
Please start the gabapentin 300mg  at night. You can increase this to 300mg  twice daily after about a week.   We will call you with the results when they are available.   I would like for you to see physical therapy as well.  Come back to see me in a few weeks.  Take care, Dr Jerline Pain

## 2017-05-22 NOTE — Telephone Encounter (Signed)
Please see xray result annotation.  Algis Greenhouse. Jerline Pain, MD 05/22/2017 5:11 PM

## 2017-05-22 NOTE — Telephone Encounter (Signed)
Called patient to schedule PT. Patient was ok with going ahead and scheduling for PT but would like a call from the nurse to discuss his results from today's visit. Please advise.

## 2017-05-23 NOTE — Telephone Encounter (Signed)
Results have been left on patient's voicemail.  Please see result note.

## 2017-05-28 ENCOUNTER — Other Ambulatory Visit: Payer: Self-pay | Admitting: Physician Assistant

## 2017-05-28 ENCOUNTER — Ambulatory Visit: Payer: BLUE CROSS/BLUE SHIELD

## 2017-05-28 DIAGNOSIS — N522 Drug-induced erectile dysfunction: Secondary | ICD-10-CM

## 2017-06-03 ENCOUNTER — Ambulatory Visit: Payer: BLUE CROSS/BLUE SHIELD | Admitting: Family Medicine

## 2017-06-04 ENCOUNTER — Ambulatory Visit: Payer: BLUE CROSS/BLUE SHIELD | Admitting: Family Medicine

## 2017-06-04 ENCOUNTER — Encounter: Payer: Self-pay | Admitting: Family Medicine

## 2017-06-04 DIAGNOSIS — Z029 Encounter for administrative examinations, unspecified: Secondary | ICD-10-CM

## 2017-07-30 ENCOUNTER — Other Ambulatory Visit: Payer: Self-pay | Admitting: Physician Assistant

## 2017-07-30 DIAGNOSIS — N522 Drug-induced erectile dysfunction: Secondary | ICD-10-CM

## 2017-07-31 NOTE — Telephone Encounter (Signed)
Patient is requesting a refill of the following medications: Requested Prescriptions   Pending Prescriptions Disp Refills  . sildenafil (REVATIO) 20 MG tablet [Pharmacy Med Name: SILDENAFIL CITRATE 20 MG TABLET] 50 tablet 11    Sig: TAKE 1/2-2 tabs ON EMPTY stomach 1 hour BEFORE sex. USE lowest possible DOSE. No alcohol OR benzodiazepines    Date of patient request: 07/30/2017 Last office visit: 07/02/2016 Date of last refill:07/05/2016 Last refill amount: 50 Follow up time period per chart: n/a  Please advise. Dgaddy, CMA

## 2017-09-26 ENCOUNTER — Other Ambulatory Visit: Payer: Self-pay | Admitting: Physician Assistant

## 2017-09-26 ENCOUNTER — Other Ambulatory Visit: Payer: Self-pay

## 2017-09-26 DIAGNOSIS — N522 Drug-induced erectile dysfunction: Secondary | ICD-10-CM

## 2017-09-26 MED ORDER — SILDENAFIL CITRATE 20 MG PO TABS: ORAL_TABLET | ORAL | 0 refills | Status: DC

## 2017-09-26 NOTE — Telephone Encounter (Signed)
Rx sent to pharmacy.  One fill with no refills.

## 2017-09-26 NOTE — Telephone Encounter (Signed)
Ok to fill never given by our office.

## 2017-09-26 NOTE — Telephone Encounter (Signed)
Ok to give one fill without refills.   Algis Greenhouse. Jerline Pain, MD 09/26/2017 1:20 PM

## 2017-09-26 NOTE — Telephone Encounter (Signed)
Refill for sildenafil 20 mg tab, prescription expired 07/05/17  Prescribed by historical provider, Philis Fendt, PA  PCP: Zack Seal [facility] 07/05/16 with 11 refills  [place], [address]

## 2018-01-13 ENCOUNTER — Emergency Department (HOSPITAL_COMMUNITY)
Admission: EM | Admit: 2018-01-13 | Discharge: 2018-01-13 | Disposition: A | Discharge status: Home / Self Care | Payer: BLUE CROSS/BLUE SHIELD | Attending: Emergency Medicine | Admitting: Emergency Medicine

## 2018-01-13 ENCOUNTER — Encounter (HOSPITAL_COMMUNITY): Payer: Self-pay | Admitting: Emergency Medicine

## 2018-01-13 ENCOUNTER — Other Ambulatory Visit: Payer: Self-pay

## 2018-01-13 DIAGNOSIS — F17200 Nicotine dependence, unspecified, uncomplicated: Secondary | ICD-10-CM | POA: Insufficient documentation

## 2018-01-13 DIAGNOSIS — R0789 Other chest pain: Secondary | ICD-10-CM | POA: Insufficient documentation

## 2018-01-13 DIAGNOSIS — Z79899 Other long term (current) drug therapy: Secondary | ICD-10-CM | POA: Insufficient documentation

## 2018-01-13 NOTE — ED Triage Notes (Signed)
Pt arrives to ED from home with complaints of chest pain since midnight yesterday. Pt reports left sided chest pain without radiation. Pt states he has been using heroin for the last two weeks. Pt seems very anxious during triage. Pt placed in position of comfort with bed locked and lowered, call bell in reach.

## 2018-01-13 NOTE — ED Provider Notes (Signed)
[facility] EMERGENCY DEPARTMENT Provider Note   CSN: [id number] Arrival date & time: 01/13/18  8563     History   Chief Complaint Chief Complaint  Patient presents with  . Chest Pain    HPI [name] is a 47 y.o. male.  The history is provided by the patient.  Chest Pain   This is a new problem. The current episode started 6 to 12 hours ago. The problem has been resolved. The pain is associated with movement and raising an arm. The pain is present in the lateral region. The pain is at a severity of 0/10. The patient is experiencing no pain. The quality of the pain is described as burning. The pain does not radiate. Pertinent negatives include no abdominal pain, no back pain, no cough, no diaphoresis, no dizziness, no exertional chest pressure, no fever, no hemoptysis, no irregular heartbeat, no malaise/fatigue, no nausea, no orthopnea, no palpitations, no shortness of breath, no syncope and no vomiting. He has tried rest for the symptoms. Risk factors include male gender.  Pertinent negatives for past medical history include no CAD, no hyperlipidemia, no hypertension and no seizures.    Past Medical History:  Diagnosis Date  . Anxiety   . Depression   . Kidney stones   . MRSA (methicillin resistant staph aureus) culture positive   . Substance abuse ([place])    former abuser methodone and oxycodone    Patient Active Problem List   Diagnosis Date Noted  . ADD (attention deficit disorder) 02/02/2015  . Insomnia 04/08/2012  . Substance abuse in remission ([place]) 04/08/2012  . GAD (generalized anxiety disorder) 11/02/2011  . Chronic back pain 11/02/2011    Past Surgical History:  Procedure Laterality Date  . kidney stones          Home Medications    Prior to Admission medications   Medication Sig Start Date End Date Taking? Authorizing Provider  alprazolam Duanne Moron) 2 MG tablet TAKE 1 TABLET BY MOUTH 3 TIMES DAILY AS NEEDED FOR ANXIETY 12/21/15    Weber, Damaris Hippo, PA-C  amphetamine-dextroamphetamine (ADDERALL) 20 MG tablet Take 20 mg by mouth 3 (three) times daily. 01/31/14   [provider]  citalopram (CELEXA) 40 MG tablet Take 1 tablet (40 mg total) by mouth daily. 05/24/15   Leandrew Koyanagi, MD  gabapentin (NEURONTIN) 300 MG capsule Take 1 capsule (300 mg total) by mouth at bedtime. 05/22/17   Vivi Barrack, MD  losartan (COZAAR) 25 MG tablet Take 25 mg by mouth daily.    [provider]  sildenafil (REVATIO) 20 MG tablet TAKE 1/2-2 tabs ON EMPTY stomach 1 hour BEFORE sex. USE lowest possible DOSE. No alcohol OR benzodiazepines 09/26/17   Vivi Barrack, MD    Family History Family History  Problem Relation Age of Onset  . Hypertension Mother   . Cancer Maternal Grandmother        ovarian  . Diabetes Paternal Grandfather     Social History Social History   Tobacco Use  . Smoking status: Light Tobacco Smoker    Packs/day: 0.00    Years: 26.00    Pack years: 0.00    Last attempt to quit: 04/05/2015    Years since quitting: 2.7  . Smokeless tobacco: Never Used  Substance Use Topics  . Alcohol use: Yes    Alcohol/week: 0.0 standard drinks    Comment: 3 times per week  . Drug use: Yes    Types: Marijuana  Comment: once per month     Allergies   Codeine and Tylenol [acetaminophen]   Review of Systems Review of Systems  Constitutional: Negative for chills, diaphoresis, fever and malaise/fatigue.  HENT: Negative for ear pain and sore throat.   Eyes: Negative for pain and visual disturbance.  Respiratory: Negative for cough, hemoptysis and shortness of breath.   Cardiovascular: Positive for chest pain. Negative for palpitations, orthopnea and syncope.  Gastrointestinal: Negative for abdominal pain, nausea and vomiting.  Genitourinary: Negative for dysuria and hematuria.  Musculoskeletal: Negative for arthralgias and back pain.  Skin: Negative for color change and rash.  Neurological:  Negative for dizziness, seizures and syncope.  All other systems reviewed and are negative.    Physical Exam Updated Vital Signs BP (!) 150/104 (BP Location: Right Arm)   Pulse 99   Temp 98.1 F (36.7 C) (Oral)   Resp 14   Ht 5\' 10"  (1.778 m)   Wt 96.2 kg   SpO2 100%   BMI 30.42 kg/m   Physical Exam  Constitutional: He appears well-developed and well-nourished.  HENT:  Head: Normocephalic and atraumatic.  Eyes: Pupils are equal, round, and reactive to light. Conjunctivae and EOM are normal.  Neck: Normal range of motion. Neck supple.  Cardiovascular: Normal rate, regular rhythm, intact distal pulses and normal pulses.  No murmur heard. Pulmonary/Chest: Effort normal and breath sounds normal. No respiratory distress. He has no decreased breath sounds. He has no wheezes. He has no rales.  Abdominal: Soft. There is no tenderness.  Musculoskeletal: He exhibits no edema.       Right lower leg: He exhibits no edema.       Left lower leg: He exhibits no edema.  TTP to left side of chest wall  Neurological: He is alert.  Skin: Skin is warm and dry. Capillary refill takes less than 2 seconds.  Psychiatric: He has a normal mood and affect.  Nursing note and vitals reviewed.    ED Treatments / Results  Labs (all labs ordered are listed, but only abnormal results are displayed) Labs Reviewed - No data to display  EKG EKG Interpretation  Date/Time:  Tuesday January 13 2018 07:11:34 EST Ventricular Rate:  94 PR Interval:    QRS Duration: 89 QT Interval:  331 QTC Calculation: 414 R Axis:   57 Text Interpretation:  Sinus rhythm Confirmed by Lennice Sites [phone]) on 01/13/2018 7:20:49 AM   Radiology No results found.  Procedures Procedures (including critical care time)  Medications Ordered in ED Medications - No data to display   Initial Impression / Assessment and Plan / ED Course  I have reviewed the triage vital signs and the nursing notes.  Pertinent labs &  imaging results that were available during my care of the patient were reviewed by me and considered in my medical decision making (see chart for details).     [name] is a 47 year old male with history of hypertension, anxiety who presents to the ED with chest pain.  Patient with normal vitals.  No fever.  Patient with chest pain that started overnight that has now resolved.  Patient states that he was having a burning pain on the left side of his chest that seemed to be worse with movement.  He checked his blood pressure at home and it was elevated and he came for evaluation.  Overall patient is asymptomatic now.  EKG showed sinus rhythm with no signs of ischemic changes.  Clear breath sounds.  No  signs of volume overload on exam.  Patient is overall well-appearing.  Does have a history of IV drug use.  Patient requesting to be discharged at this time as he feels better and reassured following normal vital signs and EKG.  Offered patient lab work including troponins to further evaluate for ACS.  Although seems that patient has atypical chest pain.  Low concern for pneumonia, PE.  Patient was discharged upon his request.  Given return precautions.  This chart was dictated using voice recognition software.  Despite best efforts to proofread,  errors can occur which can change the documentation meaning.   Final Clinical Impressions(s) / ED Diagnoses   Final diagnoses:  Atypical chest pain    ED Discharge Orders    None       Lennice Sites, DO 01/13/18 0725

## 2018-02-27 ENCOUNTER — Emergency Department (HOSPITAL_COMMUNITY): Payer: BLUE CROSS/BLUE SHIELD

## 2018-02-27 ENCOUNTER — Other Ambulatory Visit: Payer: Self-pay

## 2018-02-27 ENCOUNTER — Emergency Department (HOSPITAL_COMMUNITY)
Admission: EM | Admit: 2018-02-27 | Discharge: 2018-02-27 | Disposition: A | Discharge status: Home / Self Care | Payer: BLUE CROSS/BLUE SHIELD | Attending: Emergency Medicine | Admitting: Emergency Medicine

## 2018-02-27 DIAGNOSIS — M25572 Pain in left ankle and joints of left foot: Secondary | ICD-10-CM | POA: Diagnosis not present

## 2018-02-27 DIAGNOSIS — L03116 Cellulitis of left lower limb: Secondary | ICD-10-CM | POA: Diagnosis not present

## 2018-02-27 DIAGNOSIS — Z79899 Other long term (current) drug therapy: Secondary | ICD-10-CM | POA: Insufficient documentation

## 2018-02-27 DIAGNOSIS — F17210 Nicotine dependence, cigarettes, uncomplicated: Secondary | ICD-10-CM | POA: Insufficient documentation

## 2018-02-27 DIAGNOSIS — R2242 Localized swelling, mass and lump, left lower limb: Secondary | ICD-10-CM | POA: Insufficient documentation

## 2018-02-27 DIAGNOSIS — L089 Local infection of the skin and subcutaneous tissue, unspecified: Secondary | ICD-10-CM | POA: Diagnosis present

## 2018-02-27 DIAGNOSIS — F1210 Cannabis abuse, uncomplicated: Secondary | ICD-10-CM | POA: Insufficient documentation

## 2018-02-27 LAB — CBC WITH DIFFERENTIAL/PLATELET
Abs Immature Granulocytes: 0.03 10*3/uL (ref 0.00–0.07)
BASOS ABS: 0 10*3/uL (ref 0.0–0.1)
BASOS PCT: 0 %
EOS ABS: 0.5 10*3/uL (ref 0.0–0.5)
Eosinophils Relative: 4 %
HCT: 36.3 % — ABNORMAL LOW (ref 39.0–52.0)
Hemoglobin: 11.3 g/dL — ABNORMAL LOW (ref 13.0–17.0)
IMMATURE GRANULOCYTES: 0 %
Lymphocytes Relative: 13 %
Lymphs Abs: 1.5 10*3/uL (ref 0.7–4.0)
MCH: 28.4 pg (ref 26.0–34.0)
MCHC: 31.1 g/dL (ref 30.0–36.0)
MCV: 91.2 fL (ref 80.0–100.0)
Monocytes Absolute: 0.9 10*3/uL (ref 0.1–1.0)
Monocytes Relative: 8 %
NEUTROS PCT: 75 %
NRBC: 0 % (ref 0.0–0.2)
Neutro Abs: 8.6 10*3/uL — ABNORMAL HIGH (ref 1.7–7.7)
PLATELETS: 299 10*3/uL (ref 150–400)
RBC: 3.98 MIL/uL — ABNORMAL LOW (ref 4.22–5.81)
RDW: 13.2 % (ref 11.5–15.5)
WBC: 11.5 10*3/uL — AB (ref 4.0–10.5)

## 2018-02-27 LAB — BASIC METABOLIC PANEL
ANION GAP: 12 (ref 5–15)
BUN: 23 mg/dL — ABNORMAL HIGH (ref 6–20)
CALCIUM: 9.3 mg/dL (ref 8.9–10.3)
CO2: 21 mmol/L — ABNORMAL LOW (ref 22–32)
Chloride: 102 mmol/L (ref 98–111)
Creatinine, Ser: 1.18 mg/dL (ref 0.61–1.24)
GLUCOSE: 100 mg/dL — AB (ref 70–99)
Potassium: 4.8 mmol/L (ref 3.5–5.1)
Sodium: 135 mmol/L (ref 135–145)

## 2018-02-27 MED ORDER — CEPHALEXIN 250 MG PO CAPS
500.0000 mg | ORAL_CAPSULE | Freq: Once | ORAL | Status: AC
  Administered 2018-02-27: 500 mg via ORAL
  Filled 2018-02-27: qty 2

## 2018-02-27 MED ORDER — CEPHALEXIN 500 MG PO CAPS: 500.0000 mg | ORAL_CAPSULE | Freq: Four times a day (QID) | ORAL | 0 refills | Status: DC

## 2018-02-27 MED ORDER — DOXYCYCLINE HYCLATE 100 MG PO CAPS: 100.0000 mg | ORAL_CAPSULE | Freq: Two times a day (BID) | ORAL | 0 refills | Status: DC

## 2018-02-27 MED ORDER — DOXYCYCLINE HYCLATE 100 MG PO TABS
100.0000 mg | ORAL_TABLET | Freq: Once | ORAL | Status: AC
  Administered 2018-02-27: 100 mg via ORAL
  Filled 2018-02-27: qty 1

## 2018-02-27 NOTE — Discharge Instructions (Signed)
We saw in the ER for swelling and pain in your foot. Please take the antibiotics prescribed and return to the ER in 2 days for recheck of your wound. Return to the ER if your symptoms get worse.

## 2018-02-27 NOTE — ED Notes (Signed)
Portable Xray at bedside.

## 2018-02-27 NOTE — ED Triage Notes (Signed)
Pt arrived with infection to arms/hands/feet, Hx of IV drug use, pt stated he has been injecting meth-amphetamine for approximately 6 months in his upper extremities and he has never stuck in his lower extremities where some of the infection has spread to.

## 2018-02-27 NOTE — ED Provider Notes (Signed)
[facility] EMERGENCY DEPARTMENT Provider Note   CSN: [id number] Arrival date & time: 02/27/18  1118     History   Chief Complaint Chief Complaint  Patient presents with  . Recurrent Skin Infections    HPI [name] is a 48 y.o. male.  HPI 48 year old male comes in with chief complaint of skin infection. Patient admits to using meth-amphetamines intravenously.  Patient also has history of MRSA infection.  Patient reports that he started having pain in his left foot yesterday evening.  Over time there is been increased swelling and pain in his left leg.  Patient denies any nausea, vomiting, fevers, chills, diaphoresis.  Past Medical History:  Diagnosis Date  . Anxiety   . Depression   . Kidney stones   . MRSA (methicillin resistant staph aureus) culture positive   . Substance abuse ([place])    former abuser methodone and oxycodone    Patient Active Problem List   Diagnosis Date Noted  . ADD (attention deficit disorder) 02/02/2015  . Insomnia 04/08/2012  . Substance abuse in remission ([place]) 04/08/2012  . GAD (generalized anxiety disorder) 11/02/2011  . Chronic back pain 11/02/2011    Past Surgical History:  Procedure Laterality Date  . kidney stones          Home Medications    Prior to Admission medications   Medication Sig Start Date End Date Taking? Authorizing Provider  alprazolam Duanne Moron) 2 MG tablet TAKE 1 TABLET BY MOUTH 3 TIMES DAILY AS NEEDED FOR ANXIETY 12/21/15   Weber, Damaris Hippo, PA-C  amphetamine-dextroamphetamine (ADDERALL) 20 MG tablet Take 20 mg by mouth 3 (three) times daily. 01/31/14   [provider]  cephALEXin (KEFLEX) 500 MG capsule Take 1 capsule (500 mg total) by mouth 4 (four) times daily. 02/27/18   Varney Biles, MD  citalopram (CELEXA) 40 MG tablet Take 1 tablet (40 mg total) by mouth daily. 05/24/15   Leandrew Koyanagi, MD  doxycycline (VIBRAMYCIN) 100 MG capsule Take 1 capsule (100 mg total) by mouth  2 (two) times daily. 02/27/18   Varney Biles, MD  gabapentin (NEURONTIN) 300 MG capsule Take 1 capsule (300 mg total) by mouth at bedtime. 05/22/17   Vivi Barrack, MD  losartan (COZAAR) 25 MG tablet Take 25 mg by mouth daily.    [provider]  sildenafil (REVATIO) 20 MG tablet TAKE 1/2-2 tabs ON EMPTY stomach 1 hour BEFORE sex. USE lowest possible DOSE. No alcohol OR benzodiazepines 09/26/17   Vivi Barrack, MD    Family History Family History  Problem Relation Age of Onset  . Hypertension Mother   . Cancer Maternal Grandmother        ovarian  . Diabetes Paternal Grandfather     Social History Social History   Tobacco Use  . Smoking status: Light Tobacco Smoker    Packs/day: 0.00    Years: 26.00    Pack years: 0.00    Last attempt to quit: 04/05/2015    Years since quitting: 2.9  . Smokeless tobacco: Never Used  Substance Use Topics  . Alcohol use: Yes    Alcohol/week: 0.0 standard drinks    Comment: 3 times per week  . Drug use: Yes    Types: Marijuana    Comment: once per month     Allergies   Codeine and Tylenol [acetaminophen]   Review of Systems Review of Systems  Constitutional: Positive for activity change. Negative for chills, diaphoresis and fever.  Gastrointestinal: Negative  for nausea.  Musculoskeletal: Positive for myalgias.  Skin: Positive for rash.  Allergic/Immunologic: Negative for immunocompromised state.     Physical Exam Updated Vital Signs BP 119/72   Pulse 88   Temp 98.7 F (37.1 C) (Oral)   Resp 14   Ht 5\' 10"  (1.778 m)   Wt 95.3 kg   SpO2 99%   BMI 30.13 kg/m   Physical Exam Vitals signs and nursing note reviewed.  Constitutional:      Appearance: He is well-developed.  HENT:     Head: Atraumatic.  Neck:     Musculoskeletal: Neck supple.  Cardiovascular:     Rate and Rhythm: Normal rate.     Comments: Intact dorsalis pedis bilaterally Pulmonary:     Effort: Pulmonary effort is normal.  Musculoskeletal:         General: Swelling and tenderness present.     Left lower leg: Edema present.     Comments: Patient has generalized erythema, edema and tenderness to palpation over the left foot and ankle.  The foot is also warm to touch.  There is pitting without any crepitus.  Patient is able to plantar flex and dorsiflex, however his range of motion is compromised due to pain.  No clinical concerns for compartment syndrome  Skin:    Findings: Rash present.  Neurological:     Mental Status: He is alert and oriented to person, place, and time.            ED Treatments / Results  Labs (all labs ordered are listed, but only abnormal results are displayed) Labs Reviewed  CBC WITH DIFFERENTIAL/PLATELET - Abnormal; Notable for the following components:      Result Value   WBC 11.5 (*)    RBC 3.98 (*)    Hemoglobin 11.3 (*)    HCT 36.3 (*)    Neutro Abs 8.6 (*)    All other components within normal limits  BASIC METABOLIC PANEL - Abnormal; Notable for the following components:   CO2 21 (*)    Glucose, Bld 100 (*)    BUN 23 (*)    All other components within normal limits    EKG None  Radiology Dg Ankle 2 Views Left  Result Date: 02/27/2018 CLINICAL DATA:  Infection of.  Redness and swelling EXAM: LEFT ANKLE - 2 VIEW COMPARISON:  None. FINDINGS: There is no evidence of fracture, dislocation, or joint effusion. There is no evidence of arthropathy or other focal bone abnormality. There is severe soft tissue swelling of the left ankle and foot as can be seen with fluid overload versus cellulitis. There is no soft tissue emphysema. IMPRESSION: Severe soft tissue swelling of the left ankle and foot as can be seen with fluid overload versus cellulitis. Electronically Signed   By: Kathreen Devoid   On: 02/27/2018 14:34    Procedures Procedures (including critical care time)  EMERGENCY DEPARTMENT US SOFT TISSUE INTERPRETATION "Study: Limited Soft Tissue Ultrasound"  INDICATIONS: Pain and  Soft tissue infection Multiple views of the body part were obtained in real-time with a multi-frequency linear probe  PERFORMED BY: Myself IMAGES ARCHIVED?: Yes SIDE:Left BODY PART:Lower extremity INTERPRETATION:  No abcess noted and Cellulitis present     Medications Ordered in ED Medications  cephALEXin (KEFLEX) capsule 500 mg (500 mg Oral Given 02/27/18 1419)  doxycycline (VIBRA-TABS) tablet 100 mg (100 mg Oral Given 02/27/18 1418)     Initial Impression / Assessment and Plan / ED Course  I have reviewed the  triage vital signs and the nursing notes.  Pertinent labs & imaging results that were available during my care of the patient were reviewed by me and considered in my medical decision making (see chart for details).     Patient with history of polysubstance abuse comes in a chief complaint of left foot swelling and pain. He typically uses his upper extremity for intravenous drug use.  He reports that the swelling in his left foot started yesterday and has progressed today.  There is no systemic symptoms associated with this pain and swelling.  On exam there is no crepitus.  Patient has significant edema and tenderness to palpation. X-ray rules out any gas. Bedside ultrasound done and there is no evidence of abscess.  Suspicion is high for localized cellulitis.  Patient states that he has had MRSA infections in the past -and he uses IV drugs therefore MRSA infection possibility is possible.  Given that he has no systemic signs, white count is normal, there is no abscess and he is agreeing to return to the ER if his symptoms are getting worse -we will discharge patient with strict ER return precautions.  Final Clinical Impressions(s) / ED Diagnoses   Final diagnoses:  Cellulitis of left lower extremity    ED Discharge Orders         Ordered    cephALEXin (KEFLEX) 500 MG capsule  4 times daily     02/27/18 1519    doxycycline (VIBRAMYCIN) 100 MG capsule  2 times daily      02/27/18 1519           Varney Biles, MD 02/27/18 1529

## 2018-03-24 ENCOUNTER — Other Ambulatory Visit: Payer: Self-pay | Admitting: Family Medicine

## 2018-03-24 DIAGNOSIS — N522 Drug-induced erectile dysfunction: Secondary | ICD-10-CM

## 2018-04-14 MED ORDER — CLONIDINE HCL 0.1 MG PO TABS
0.10 | ORAL_TABLET | ORAL | Status: DC
Start: ? — End: 2018-04-14

## 2018-04-14 MED ORDER — LOSARTAN POTASSIUM 50 MG PO TABS
50.00 | ORAL_TABLET | ORAL | Status: DC
Start: 2018-04-14 — End: 2018-04-14

## 2018-04-14 MED ORDER — ASPIRIN EC 81 MG PO TBEC
81.00 | DELAYED_RELEASE_TABLET | ORAL | Status: DC
Start: 2018-04-15 — End: 2018-04-14

## 2018-04-14 MED ORDER — ALUMINUM-MAGNESIUM-SIMETHICONE 200-200-20 MG/5ML PO SUSP
30.00 | ORAL | Status: DC
Start: ? — End: 2018-04-14

## 2018-04-14 MED ORDER — GENERIC EXTERNAL MEDICATION
1.25 | Status: DC
Start: 2018-04-14 — End: 2018-04-14

## 2018-04-14 MED ORDER — TRAMADOL HCL 50 MG PO TABS
50.00 | ORAL_TABLET | ORAL | Status: DC
Start: ? — End: 2018-04-14

## 2018-04-14 MED ORDER — ONDANSETRON HCL 4 MG/2ML IJ SOLN
4.00 | INTRAMUSCULAR | Status: DC
Start: ? — End: 2018-04-14

## 2018-04-14 MED ORDER — CITALOPRAM HYDROBROMIDE 20 MG PO TABS
60.00 | ORAL_TABLET | ORAL | Status: DC
Start: 2018-04-15 — End: 2018-04-14

## 2018-04-14 MED ORDER — TEMAZEPAM 15 MG PO CAPS
15.00 | ORAL_CAPSULE | ORAL | Status: DC
Start: ? — End: 2018-04-14

## 2018-04-14 MED ORDER — ALPRAZOLAM 0.5 MG PO TABS
2.00 | ORAL_TABLET | ORAL | Status: DC
Start: ? — End: 2018-04-14

## 2018-04-14 MED ORDER — DOCUSATE SODIUM 100 MG PO CAPS
100.00 | ORAL_CAPSULE | ORAL | Status: DC
Start: ? — End: 2018-04-14

## 2018-04-14 MED ORDER — ENOXAPARIN SODIUM 40 MG/0.4ML ~~LOC~~ SOLN
40.00 | SUBCUTANEOUS | Status: DC
Start: 2018-04-14 — End: 2018-04-14

## 2018-04-14 MED ORDER — ACETAMINOPHEN 325 MG PO TABS
650.00 | ORAL_TABLET | ORAL | Status: DC
Start: ? — End: 2018-04-14

## 2018-04-14 MED ORDER — OXYCODONE-ACETAMINOPHEN 5-325 MG PO TABS
2.00 | ORAL_TABLET | ORAL | Status: DC
Start: ? — End: 2018-04-14

## 2018-06-03 ENCOUNTER — Emergency Department (HOSPITAL_BASED_OUTPATIENT_CLINIC_OR_DEPARTMENT_OTHER)
Admission: EM | Admit: 2018-06-03 | Discharge: 2018-06-03 | Discharge status: Absent without leave | Payer: BLUE CROSS/BLUE SHIELD

## 2018-06-06 MED ORDER — DICYCLOMINE HCL 10 MG PO CAPS
10.00 | ORAL_CAPSULE | ORAL | Status: DC
Start: ? — End: 2018-06-06

## 2018-06-06 MED ORDER — LOPERAMIDE HCL 2 MG PO CAPS
2.00 | ORAL_CAPSULE | ORAL | Status: DC
Start: ? — End: 2018-06-06

## 2018-06-06 MED ORDER — CLONIDINE HCL 0.1 MG PO TABS
0.10 | ORAL_TABLET | ORAL | Status: DC
Start: ? — End: 2018-06-06

## 2018-06-06 MED ORDER — LORAZEPAM 1 MG PO TABS
2.00 | ORAL_TABLET | ORAL | Status: DC
Start: ? — End: 2018-06-06

## 2018-06-06 MED ORDER — CITALOPRAM HYDROBROMIDE 20 MG PO TABS
40.00 | ORAL_TABLET | ORAL | Status: DC
Start: 2018-06-06 — End: 2018-06-06

## 2018-06-06 MED ORDER — LOSARTAN POTASSIUM 50 MG PO TABS
50.00 | ORAL_TABLET | ORAL | Status: DC
Start: 2018-06-06 — End: 2018-06-06

## 2018-06-06 MED ORDER — GENERIC EXTERNAL MEDICATION
600.00 | Status: DC
Start: 2018-06-06 — End: 2018-06-06

## 2018-06-06 MED ORDER — CYCLOBENZAPRINE HCL 10 MG PO TABS
10.00 | ORAL_TABLET | ORAL | Status: DC
Start: ? — End: 2018-06-06

## 2018-06-06 MED ORDER — ALPRAZOLAM 1 MG PO TABS
2.00 | ORAL_TABLET | ORAL | Status: DC
Start: ? — End: 2018-06-06

## 2018-06-06 MED ORDER — ENOXAPARIN SODIUM 40 MG/0.4ML ~~LOC~~ SOLN
40.00 | SUBCUTANEOUS | Status: DC
Start: 2018-06-06 — End: 2018-06-06

## 2018-06-06 MED ORDER — GENERIC EXTERNAL MEDICATION
1.25 | Status: DC
Start: 2018-06-06 — End: 2018-06-06

## 2018-06-06 MED ORDER — GENERIC EXTERNAL MEDICATION
2.00 | Status: DC
Start: ? — End: 2018-06-06

## 2018-06-09 ENCOUNTER — Inpatient Hospital Stay (HOSPITAL_COMMUNITY)
Admission: EM | Admit: 2018-06-09 | Discharge: 2018-06-11 | DRG: 603 | Disposition: A | Discharge status: Home / Self Care | Payer: BLUE CROSS/BLUE SHIELD | Attending: Student | Admitting: Student

## 2018-06-09 ENCOUNTER — Other Ambulatory Visit: Payer: Self-pay

## 2018-06-09 ENCOUNTER — Encounter (HOSPITAL_COMMUNITY): Payer: Self-pay

## 2018-06-09 ENCOUNTER — Emergency Department (HOSPITAL_COMMUNITY): Payer: BLUE CROSS/BLUE SHIELD

## 2018-06-09 DIAGNOSIS — Z792 Long term (current) use of antibiotics: Secondary | ICD-10-CM | POA: Diagnosis not present

## 2018-06-09 DIAGNOSIS — T39315A Adverse effect of propionic acid derivatives, initial encounter: Secondary | ICD-10-CM | POA: Diagnosis not present

## 2018-06-09 DIAGNOSIS — F17210 Nicotine dependence, cigarettes, uncomplicated: Secondary | ICD-10-CM | POA: Diagnosis present

## 2018-06-09 DIAGNOSIS — F1910 Other psychoactive substance abuse, uncomplicated: Secondary | ICD-10-CM | POA: Diagnosis present

## 2018-06-09 DIAGNOSIS — Z87442 Personal history of urinary calculi: Secondary | ICD-10-CM

## 2018-06-09 DIAGNOSIS — Z8041 Family history of malignant neoplasm of ovary: Secondary | ICD-10-CM

## 2018-06-09 DIAGNOSIS — Z8249 Family history of ischemic heart disease and other diseases of the circulatory system: Secondary | ICD-10-CM

## 2018-06-09 DIAGNOSIS — L0390 Cellulitis, unspecified: Secondary | ICD-10-CM | POA: Diagnosis present

## 2018-06-09 DIAGNOSIS — Z79899 Other long term (current) drug therapy: Secondary | ICD-10-CM | POA: Diagnosis not present

## 2018-06-09 DIAGNOSIS — G8929 Other chronic pain: Secondary | ICD-10-CM | POA: Diagnosis present

## 2018-06-09 DIAGNOSIS — F1990 Other psychoactive substance use, unspecified, uncomplicated: Secondary | ICD-10-CM

## 2018-06-09 DIAGNOSIS — T465X5A Adverse effect of other antihypertensive drugs, initial encounter: Secondary | ICD-10-CM | POA: Diagnosis not present

## 2018-06-09 DIAGNOSIS — M549 Dorsalgia, unspecified: Secondary | ICD-10-CM | POA: Diagnosis present

## 2018-06-09 DIAGNOSIS — D649 Anemia, unspecified: Secondary | ICD-10-CM | POA: Diagnosis not present

## 2018-06-09 DIAGNOSIS — Z885 Allergy status to narcotic agent status: Secondary | ICD-10-CM

## 2018-06-09 DIAGNOSIS — T368X5A Adverse effect of other systemic antibiotics, initial encounter: Secondary | ICD-10-CM | POA: Diagnosis not present

## 2018-06-09 DIAGNOSIS — F909 Attention-deficit hyperactivity disorder, unspecified type: Secondary | ICD-10-CM | POA: Diagnosis not present

## 2018-06-09 DIAGNOSIS — Z833 Family history of diabetes mellitus: Secondary | ICD-10-CM

## 2018-06-09 DIAGNOSIS — Z8614 Personal history of Methicillin resistant Staphylococcus aureus infection: Secondary | ICD-10-CM | POA: Diagnosis not present

## 2018-06-09 DIAGNOSIS — F329 Major depressive disorder, single episode, unspecified: Secondary | ICD-10-CM | POA: Diagnosis not present

## 2018-06-09 DIAGNOSIS — Z888 Allergy status to other drugs, medicaments and biological substances status: Secondary | ICD-10-CM

## 2018-06-09 DIAGNOSIS — L03114 Cellulitis of left upper limb: Secondary | ICD-10-CM

## 2018-06-09 DIAGNOSIS — R7989 Other specified abnormal findings of blood chemistry: Secondary | ICD-10-CM | POA: Diagnosis present

## 2018-06-09 DIAGNOSIS — F411 Generalized anxiety disorder: Secondary | ICD-10-CM | POA: Diagnosis present

## 2018-06-09 DIAGNOSIS — F39 Unspecified mood [affective] disorder: Secondary | ICD-10-CM | POA: Diagnosis not present

## 2018-06-09 HISTORY — DX: Dorsalgia, unspecified: M54.9

## 2018-06-09 HISTORY — DX: Personal history of other diseases of the digestive system: Z87.19

## 2018-06-09 HISTORY — DX: Personal history of urinary calculi: Z87.442

## 2018-06-09 HISTORY — DX: Other chronic pain: G89.29

## 2018-06-09 LAB — CBC WITH DIFFERENTIAL/PLATELET
Abs Immature Granulocytes: 0.03 10*3/uL (ref 0.00–0.07)
Basophils Absolute: 0 10*3/uL (ref 0.0–0.1)
Basophils Relative: 0 %
Eosinophils Absolute: 0.2 10*3/uL (ref 0.0–0.5)
Eosinophils Relative: 2 %
HCT: 37.6 % — ABNORMAL LOW (ref 39.0–52.0)
Hemoglobin: 11.7 g/dL — ABNORMAL LOW (ref 13.0–17.0)
Immature Granulocytes: 0 %
Lymphocytes Relative: 14 %
Lymphs Abs: 1.4 10*3/uL (ref 0.7–4.0)
MCH: 27.7 pg (ref 26.0–34.0)
MCHC: 31.1 g/dL (ref 30.0–36.0)
MCV: 88.9 fL (ref 80.0–100.0)
Monocytes Absolute: 0.7 10*3/uL (ref 0.1–1.0)
Monocytes Relative: 7 %
Neutro Abs: 7.4 10*3/uL (ref 1.7–7.7)
Neutrophils Relative %: 77 %
Platelets: 373 10*3/uL (ref 150–400)
RBC: 4.23 MIL/uL (ref 4.22–5.81)
RDW: 13.3 % (ref 11.5–15.5)
WBC: 9.7 10*3/uL (ref 4.0–10.5)
nRBC: 0 % (ref 0.0–0.2)

## 2018-06-09 LAB — BASIC METABOLIC PANEL
Anion gap: 9 (ref 5–15)
BUN: 25 mg/dL — ABNORMAL HIGH (ref 6–20)
CO2: 27 mmol/L (ref 22–32)
Calcium: 8.8 mg/dL — ABNORMAL LOW (ref 8.9–10.3)
Chloride: 99 mmol/L (ref 98–111)
Creatinine, Ser: 1.28 mg/dL — ABNORMAL HIGH (ref 0.61–1.24)
GFR calc Af Amer: >60 mL/min (ref >60)
GFR calc non Af Amer: >60 mL/min (ref >60)
Glucose, Bld: 95 mg/dL (ref 70–99)
Potassium: 4.6 mmol/L (ref 3.5–5.1)
Sodium: 135 mmol/L (ref 135–145)

## 2018-06-09 MED ORDER — MAGNESIUM HYDROXIDE 400 MG/5ML PO SUSP: 30.0000 mL | Freq: Every day | ORAL | Status: DC | PRN

## 2018-06-09 MED ORDER — AMPHETAMINE-DEXTROAMPHETAMINE 10 MG PO TABS
20.0000 mg | ORAL_TABLET | Freq: Four times a day (QID) | ORAL | Status: DC
  Administered 2018-06-09 – 2018-06-10 (×5): 20 mg via ORAL
  Filled 2018-06-09 (×6): qty 2

## 2018-06-09 MED ORDER — CITALOPRAM HYDROBROMIDE 40 MG PO TABS
40.0000 mg | ORAL_TABLET | Freq: Every day | ORAL | Status: DC
  Administered 2018-06-10 – 2018-06-11 (×2): 40 mg via ORAL
  Filled 2018-06-09 (×2): qty 1

## 2018-06-09 MED ORDER — VANCOMYCIN HCL 10 G IV SOLR
1750.0000 mg | INTRAVENOUS | Status: DC
  Administered 2018-06-10: 1750 mg via INTRAVENOUS
  Filled 2018-06-09 (×2): qty 1750

## 2018-06-09 MED ORDER — ONDANSETRON HCL 4 MG PO TABS: 4.0000 mg | ORAL_TABLET | Freq: Four times a day (QID) | ORAL | Status: DC | PRN

## 2018-06-09 MED ORDER — VANCOMYCIN HCL 10 G IV SOLR: 1500.0000 mg | Freq: Two times a day (BID) | INTRAVENOUS | Status: DC

## 2018-06-09 MED ORDER — MORPHINE SULFATE (PF) 2 MG/ML IV SOLN
2.0000 mg | INTRAVENOUS | Status: DC | PRN
  Filled 2018-06-09: qty 1

## 2018-06-09 MED ORDER — FLEET ENEMA 7-19 GM/118ML RE ENEM: 1.0000 | ENEMA | Freq: Once | RECTAL | Status: DC | PRN

## 2018-06-09 MED ORDER — ONDANSETRON HCL 4 MG/2ML IJ SOLN: 4.0000 mg | Freq: Four times a day (QID) | INTRAMUSCULAR | Status: DC | PRN

## 2018-06-09 MED ORDER — ENOXAPARIN SODIUM 40 MG/0.4ML ~~LOC~~ SOLN: 40.0000 mg | SUBCUTANEOUS | Status: DC

## 2018-06-09 MED ORDER — SODIUM CHLORIDE 0.9% FLUSH
3.0000 mL | Freq: Two times a day (BID) | INTRAVENOUS | Status: DC
  Administered 2018-06-09 – 2018-06-11 (×3): 3 mL via INTRAVENOUS

## 2018-06-09 MED ORDER — OXYCODONE-ACETAMINOPHEN 5-325 MG PO TABS
1.0000 | ORAL_TABLET | ORAL | Status: DC | PRN
  Administered 2018-06-09 – 2018-06-11 (×4): 1 via ORAL
  Filled 2018-06-09 (×4): qty 1

## 2018-06-09 MED ORDER — SENNA 8.6 MG PO TABS
1.0000 | ORAL_TABLET | Freq: Two times a day (BID) | ORAL | Status: DC
  Filled 2018-06-09 (×4): qty 1

## 2018-06-09 MED ORDER — ALPRAZOLAM 0.5 MG PO TABS
2.0000 mg | ORAL_TABLET | Freq: Three times a day (TID) | ORAL | Status: DC | PRN
  Administered 2018-06-09 – 2018-06-11 (×3): 2 mg via ORAL
  Filled 2018-06-09 (×4): qty 4

## 2018-06-09 MED ORDER — SODIUM CHLORIDE 0.9 % IV SOLN: INTRAVENOUS | Status: DC

## 2018-06-09 MED ORDER — LORAZEPAM 2 MG/ML IJ SOLN
1.0000 mg | Freq: Once | INTRAMUSCULAR | Status: AC
  Administered 2018-06-09: 1 mg via INTRAVENOUS
  Filled 2018-06-09: qty 1

## 2018-06-09 MED ORDER — IBUPROFEN 400 MG PO TABS
400.0000 mg | ORAL_TABLET | Freq: Four times a day (QID) | ORAL | Status: DC | PRN
  Filled 2018-06-09: qty 2

## 2018-06-09 MED ORDER — VANCOMYCIN HCL 10 G IV SOLR
2000.0000 mg | Freq: Once | INTRAVENOUS | Status: AC
  Administered 2018-06-09: 2000 mg via INTRAVENOUS
  Filled 2018-06-09: qty 2000

## 2018-06-09 MED ORDER — SORBITOL 70 % SOLN
30.0000 mL | Freq: Every day | Status: DC | PRN
  Filled 2018-06-09: qty 30

## 2018-06-09 MED ORDER — LOSARTAN POTASSIUM 25 MG PO TABS
25.0000 mg | ORAL_TABLET | Freq: Every day | ORAL | Status: DC
  Administered 2018-06-10: 25 mg via ORAL
  Filled 2018-06-09 (×2): qty 1

## 2018-06-09 MED ORDER — KETOROLAC TROMETHAMINE 15 MG/ML IJ SOLN
15.0000 mg | Freq: Once | INTRAMUSCULAR | Status: AC
  Administered 2018-06-09: 15 mg via INTRAVENOUS
  Filled 2018-06-09: qty 1

## 2018-06-09 NOTE — H&P (Signed)
[facility] History and Physical  [name] IRW:[id number] DOB: 1970-05-05 DOA: 06/09/2018 Referring physician: ED PCP: Patient, No Pcp Per  Chief Complaint: Swelling and redness of left hand ------------------------------------------------------------------------------------------------------ Assessment/Plan: Principal Problem:   Cellulitis Active Problems:   IVDU (intravenous drug user)  Left arm cellulitis/ ?  Abscess -Started on IV vancomycin in the ED.  We will continue same. -ED attending discussed the case with hand surgery Dr. Amedeo Plenty who requested patient to be admitted to [facility] hand elevated. -Pain control with oral oxycodone.  I also ordered for IV morphine at low-dose to prevent withdrawal.  Although patient states he has not used IV heroin in the last 11 days, ?reliability  Ongoing IV drug abuse  -Counseled to quit.  Anxiety/depression -Continue Celexa, Xanax as needed, adderall  Mobility: Encourage ambulation Diet: Regular diet, n.p.o. after midnight for possible surgical intervention tomorrow. DVT prophylaxis:  Encourage ambulation Code Status:  Full code Disposition Plan:  Pending hand surgery input  ----------------------------------------------------------------------------------------------------- History of Present Illness: [name] is a 48 y.o. male who is an IV drug user also with history of MRSA skin abscesses, kidney stones, chronic back pain, anxiety/depression.  He recently was admitted in [facility] and underwent IND of an abscess on the right hand which he states was after a needle injection.  He was discharged on 4/25 supposed to be in clindamycin but he did not pick it up.  He presented to [facility] ED today with complaint of left hand and forearm swelling which is been progressively worsening for last several days.  He denies using any injection on that site.  In the ED, patient was afebrile, heart  rate mostly above 80, breathing comfortably on room air, blood pressure mostly over 120/70. ED attending discussed the case with hand surgery Dr. Amedeo Plenty who requested patient to be admitted to [facility] showed normal WBC count 9.7, hemoglobin 11.7, platelet 373, sodium 135, potassium 4.6, BUN/creatinine 25/1.28. Blood culture x2 on admission. Left hand x-ray showed n the o acute bone abnormality.  Mild diffuse soft tissue swelling.  Review of Systems:  All systems were reviewed and were negative unless otherwise mentioned in the HPI  Past medical history: Past Medical History:  Diagnosis Date  . Anxiety   . Chronic back pain   . Depression   . History of diverticulosis   . History of kidney stones   . Kidney stones   . MRSA (methicillin resistant staph aureus) culture positive   . Substance abuse ([place])    former abuser methodone and oxycodone    Past surgical history: Past Surgical History:  Procedure Laterality Date  . I&D of right arm abscess  11/14/2003  . kidney stones      Social History:  reports that he has been smoking. He has been smoking about 0.00 packs per day for the past 26.00 years. He has never used smokeless tobacco. He reports current alcohol use. He reports current drug use.  Allergies:  Allergies  Allergen Reactions  . Codeine Itching and Nausea Only  . Tylenol [Acetaminophen] Other (See Comments)    headache    Family history:  Family History  Problem Relation Age of Onset  . Hypertension Mother   . Cancer Maternal Grandmother        ovarian  . Diabetes Paternal Grandfather   . Coronary artery disease Father      Home Meds: Prior to Admission medications   Medication Sig  Start Date End Date Taking? Authorizing Provider  alprazolam (XANAX) 2 MG tablet TAKE 1 TABLET BY MOUTH 3 TIMES DAILY AS NEEDED FOR ANXIETY Patient taking differently: Take 2 mg by mouth 3 (three) times daily as needed for anxiety. TAKE 1 TABLET BY MOUTH 3  TIMES DAILY AS NEEDED FOR ANXIETY 12/21/15  Yes Weber, Sarah L, PA-C  amphetamine-dextroamphetamine (ADDERALL) 20 MG tablet Take 20 mg by mouth 4 (four) times daily.  01/31/14  Yes [provider]  citalopram (CELEXA) 40 MG tablet Take 1 tablet (40 mg total) by mouth daily. 05/24/15  Yes Leandrew Koyanagi, MD  clobetasol ointment (TEMOVATE) 6.28 % Apply 1 application topically 2 (two) times daily as needed (red spots on face).   Yes [provider]  ibuprofen (ADVIL) 200 MG tablet Take 400-600 mg by mouth every 6 (six) hours as needed for headache or mild pain.   Yes [provider]  losartan (COZAAR) 25 MG tablet Take 25 mg by mouth daily.   Yes [provider]  sildenafil (REVATIO) 20 MG tablet TAKE 1/2-2 tabs ON EMPTY stomach 1 hour BEFORE sex. USE lowest possible DOSE. No alcohol OR benzodiazepines Patient taking differently: Take 10-20 mg by mouth as needed (erectile dysfunction). TAKE 1/2-2 tabs ON EMPTY stomach 1 hour BEFORE sex. USE lowest possible DOSE. No alcohol OR benzodiazepines 09/26/17  Yes Vivi Barrack, MD  cephALEXin (KEFLEX) 500 MG capsule Take 1 capsule (500 mg total) by mouth 4 (four) times daily. Patient not taking: Reported on 06/09/2018 02/27/18   Varney Biles, MD  doxycycline (VIBRAMYCIN) 100 MG capsule Take 1 capsule (100 mg total) by mouth 2 (two) times daily. Patient not taking: Reported on 06/09/2018 02/27/18   Varney Biles, MD  gabapentin (NEURONTIN) 300 MG capsule Take 1 capsule (300 mg total) by mouth at bedtime. Patient not taking: Reported on 06/09/2018 05/22/17   Vivi Barrack, MD    Physical Exam: Vitals:   06/09/18 1400 06/09/18 1500 06/09/18 1600 06/09/18 1651  BP: 114/81 125/71 123/73 (!) 143/81  Pulse: 84 78 80 87  Resp: 20  16 18   Temp:    98.4 F (36.9 C)  TempSrc:    Oral  SpO2: 98% 98% 95% 98%  Weight:      Height:       Wt Readings from Last 3 Encounters:  06/09/18 104.3 kg  02/27/18 95.3 kg  01/13/18  96.2 kg   Body mass index is 33.97 kg/m.  General exam: Appears calm and comfortable.  Pain control at this time Skin: No rashes, lesions or ulcers. HEENT: Normal Lungs: Clear to auscultation bilaterally CVS: Regular rate and rhythm, no murmur GI/Abd soft, nondistended, nontender, bowel sound present CNS: Alert, awake, oriented x3 Psychiatry: Mood & affect appropriate.  Extremities: Multiple areas of needle injection and scabbed wounds in both hands and forearms.  Both hands forearms swollen, left more than right.  Left hand tender and swollen mostly in thenar area.  Labs on Admission:   CBC: Recent Labs  Lab 06/09/18 1341  WBC 9.7  NEUTROABS 7.4  HGB 11.7*  HCT 37.6*  MCV 88.9  PLT 366    Basic Metabolic Panel: Recent Labs  Lab 06/09/18 1341  NA 135  K 4.6  CL 99  CO2 27  GLUCOSE 95  BUN 25*  CREATININE 1.28*  CALCIUM 8.8*    Liver Function Tests: No results for input(s): AST, ALT, ALKPHOS, BILITOT, PROT, ALBUMIN in the last 168 hours. No results for input(s):  LIPASE, AMYLASE in the last 168 hours. No results for input(s): AMMONIA in the last 168 hours.  Cardiac Enzymes: No results for input(s): CKTOTAL, CKMB, CKMBINDEX, TROPONINI in the last 168 hours.  BNP (last 3 results) No results for input(s): BNP in the last 8760 hours.  ProBNP (last 3 results) No results for input(s): PROBNP in the last 8760 hours.  CBG: No results for input(s): GLUCAP in the last 168 hours.  Lipase     Component Value Date/Time   LIPASE 29 12/01/2008 0255     Urinalysis    Component Value Date/Time   COLORURINE YELLOW 12/01/2008 0412   APPEARANCEUR CLOUDY (A) 12/01/2008 0412   LABSPEC 1.015 12/01/2008 0412   PHURINE 8.5 (H) 12/01/2008 0412   GLUCOSEU NEGATIVE 12/01/2008 0412   HGBUR NEGATIVE 12/01/2008 0412   BILIRUBINUR NEGATIVE 12/01/2008 0412   KETONESUR NEGATIVE 12/01/2008 0412   PROTEINUR NEGATIVE 12/01/2008 0412   UROBILINOGEN 0.2 12/01/2008 0412   NITRITE  NEGATIVE 12/01/2008 0412   LEUKOCYTESUR  12/01/2008 0412    NEGATIVE MICROSCOPIC NOT DONE ON URINES WITH NEGATIVE PROTEIN, BLOOD, LEUKOCYTES, NITRITE, OR GLUCOSE <1000 mg/dL.     Drugs of Abuse     Component Value Date/Time   LABOPIA POSITIVE (A) 12/01/2008 0412   COCAINSCRNUR POSITIVE (A) 12/01/2008 0412   LABBENZ POSITIVE (A) 12/01/2008 0412   AMPHETMU NONE DETECTED 12/01/2008 0412   THCU POSITIVE (A) 12/01/2008 0412   LABBARB  12/01/2008 0412    NONE DETECTED        DRUG SCREEN FOR MEDICAL PURPOSES ONLY.  IF CONFIRMATION IS NEEDED FOR ANY PURPOSE, NOTIFY LAB WITHIN 5 DAYS.        LOWEST DETECTABLE LIMITS FOR URINE DRUG SCREEN Drug Class       Cutoff (ng/mL) Amphetamine      1000 Barbiturate      200 Benzodiazepine   694 Tricyclics       854 Opiates          300 Cocaine          300 THC              50      Radiological Exams on Admission: Dg Hand Complete Left  Result Date: 06/09/2018 CLINICAL DATA:  Pain, redness and swelling of left hand. Possible infection. No injury. History of IV drug use. EXAM: LEFT HAND - COMPLETE 3+ VIEW COMPARISON:  09/24/2013 FINDINGS: Mild diffuse soft tissue swelling. No air in the soft tissues. Underlying bony structures are within normal. IMPRESSION: No acute bone abnormality.  Mild diffuse soft tissue swelling. Electronically Signed   By: Marin Olp M.D.   On: 06/09/2018 14:12   ----------------------------------------------------------------------------------------------------------------------------------------------------------- Severity of Illness: The appropriate patient status for this patient is INPATIENT. Inpatient status is judged to be reasonable and necessary in order to provide the required intensity of service to ensure the patient's safety. The patient's presenting symptoms, physical exam findings, and initial radiographic and laboratory data in the context of their chronic comorbidities is felt to place them at high risk for  further clinical deterioration. Furthermore, it is not anticipated that the patient will be medically stable for discharge from the hospital within 2 midnights of admission. The following factors support the patient status of inpatient.   " The patient's presenting symptoms include hand swelling, pain. " The worrisome physical exam findings include cellulitis/abscess of left hand. " The initial radiographic and laboratory data are worrisome because of cellulitis. " The chronic co-morbidities include IV drug  abuse.   * I certify that at the point of admission it is my clinical judgment that the patient will require inpatient hospital care spanning beyond 2 midnights from the point of admission due to high intensity of service, high risk for further deterioration and high frequency of surveillance required.*   Signed, Terrilee Croak, MD [facility] 06/09/2018

## 2018-06-09 NOTE — ED Triage Notes (Signed)
Patient reports that he has not used IV drugs x 11 days. Patient has left hand and left arm swelling with redness. Patient informed that we needed to take his BP on the left upper arm. Noted that the patient has swelling to the right arm as well.

## 2018-06-09 NOTE — ED Notes (Signed)
[place] IV placement requested due to difficult IV start.

## 2018-06-09 NOTE — Progress Notes (Signed)
Pt is highly agitated. Asked RN to leave room and keeps messing with suitcases that are in room. Upset that food try is not right. Falls asleep then wakes up and is agitated again. Patient tried to pull IV out, and asked by RN not to pull IV. Patient threatens to leave AMA.  Dr. Pietro Cassis text paged  CN in room now trying to offer medications to patient.

## 2018-06-09 NOTE — Progress Notes (Signed)
Pt continues to be agitated, and loud and then falls asleep instantly. Agitated about food not being in room and hasn't eaten since yesterday. RN called kitchen again, food is on the way. RN offered food from nourishment room

## 2018-06-09 NOTE — ED Provider Notes (Signed)
[facility] DEPT Provider Note   CSN: [id number] Arrival date & time: 06/09/18  1230    History   Chief Complaint Chief Complaint  Patient presents with  . hand swelling  . Wound Infection    HPI [name] is a 48 y.o. male.     HPI   47yM with L hand pain and swelling. Progressively worsening over the past several days. Denies trauma. Has hx of IVDU. Recent I&D to R hand at bedside at [facility] when admitted for cellulitis. Discharged 4/25. He reports his R hand has been improving. Note says continued clindamycin but he says he has had no further antibiotics since the time of discharge. No fever. No drainage.   Past Medical History:  Diagnosis Date  . Anxiety   . Depression   . Kidney stones   . MRSA (methicillin resistant staph aureus) culture positive   . Substance abuse ([name])    former abuser methodone and oxycodone    Patient Active Problem List   Diagnosis Date Noted  . ADD (attention deficit disorder) 02/02/2015  . Insomnia 04/08/2012  . Substance abuse in remission ([name]) 04/08/2012  . GAD (generalized anxiety disorder) 11/02/2011  . Chronic back pain 11/02/2011      Home Medications    Prior to Admission medications   Medication Sig Start Date End Date Taking? Authorizing Provider  alprazolam Duanne Moron) 2 MG tablet TAKE 1 TABLET BY MOUTH 3 TIMES DAILY AS NEEDED FOR ANXIETY 12/21/15   Weber, Damaris Hippo, PA-C  amphetamine-dextroamphetamine (ADDERALL) 20 MG tablet Take 20 mg by mouth 3 (three) times daily. 01/31/14   [provider]  cephALEXin (KEFLEX) 500 MG capsule Take 1 capsule (500 mg total) by mouth 4 (four) times daily. 02/27/18   Varney Biles, MD  citalopram (CELEXA) 40 MG tablet Take 1 tablet (40 mg total) by mouth daily. 05/24/15   Leandrew Koyanagi, MD  doxycycline (VIBRAMYCIN) 100 MG capsule Take 1 capsule (100 mg total) by mouth 2 (two) times daily. 02/27/18   Varney Biles, MD  gabapentin  (NEURONTIN) 300 MG capsule Take 1 capsule (300 mg total) by mouth at bedtime. 05/22/17   Vivi Barrack, MD  losartan (COZAAR) 25 MG tablet Take 25 mg by mouth daily.    [provider]  sildenafil (REVATIO) 20 MG tablet TAKE 1/2-2 tabs ON EMPTY stomach 1 hour BEFORE sex. USE lowest possible DOSE. No alcohol OR benzodiazepines 09/26/17   Vivi Barrack, MD    Family History Family History  Problem Relation Age of Onset  . Hypertension Mother   . Cancer Maternal Grandmother        ovarian  . Diabetes Paternal Grandfather     Social History Social History   Tobacco Use  . Smoking status: Light Tobacco Smoker    Packs/day: 0.00    Years: 26.00    Pack years: 0.00    Last attempt to quit: 04/05/2015    Years since quitting: 3.1  . Smokeless tobacco: Never Used  Substance Use Topics  . Alcohol use: Yes    Alcohol/week: 0.0 standard drinks    Comment: 3 times per week  . Drug use: Yes    Comment: heroin daily    Allergies   Codeine and Tylenol [acetaminophen]  Review of Systems Review of Systems  All systems reviewed and negative, other than as noted in HPI.  Physical Exam Updated Vital Signs BP (!) 139/98 (BP Location: Left Arm)   Pulse 100  Temp 98.7 F (37.1 C) (Oral)   Resp 20   Ht 5\' 9"  (1.753 m)   Wt 104.3 kg   SpO2 100%   BMI 33.97 kg/m   Physical Exam Vitals signs and nursing note reviewed.  Constitutional:      General: He is not in acute distress.    Appearance: He is well-developed.     Comments: Disheveled appearance  HENT:     Head: Normocephalic and atraumatic.  Eyes:     General:        Right eye: No discharge.        Left eye: No discharge.     Conjunctiva/sclera: Conjunctivae normal.  Neck:     Musculoskeletal: Neck supple.  Cardiovascular:     Rate and Rhythm: Normal rate and regular rhythm.     Heart sounds: Normal heart sounds. No murmur. No friction rub. No gallop.   Pulmonary:     Effort: Pulmonary effort is normal. No  respiratory distress.     Breath sounds: Normal breath sounds.  Abdominal:     General: There is no distension.     Palpations: Abdomen is soft.     Tenderness: There is no abdominal tenderness.  Musculoskeletal:     Comments: L hand as pictured. Induration over dorsal aspect of hand but not fluctuance or drainage. Palmar aspect of hand w/o significant findings. Able to extend thumb and index finger w/o apparent difficulty. ROM at wrist w/o significant pain. Site of recent I&D of thenar eminence R hand appears to be healing w/o complication. Track marks and innumerable other skin lesions in various stages of healing to b/l forearms.   Skin:    General: Skin is warm and dry.  Neurological:     Mental Status: He is alert.  Psychiatric:        Behavior: Behavior normal.        Thought Content: Thought content normal.        ED Treatments / Results  Labs (all labs ordered are listed, but only abnormal results are displayed) Labs Reviewed  CBC WITH DIFFERENTIAL/PLATELET - Abnormal; Notable for the following components:      Result Value   Hemoglobin 11.7 (*)    HCT 37.6 (*)    All other components within normal limits  BASIC METABOLIC PANEL - Abnormal; Notable for the following components:   BUN 25 (*)    Creatinine, Ser 1.28 (*)    Calcium 8.8 (*)    All other components within normal limits  BASIC METABOLIC PANEL - Abnormal; Notable for the following components:   Glucose, Bld 116 (*)    BUN 21 (*)    Calcium 8.7 (*)    All other components within normal limits  CBC - Abnormal; Notable for the following components:   RBC 3.69 (*)    Hemoglobin 10.4 (*)    HCT 32.0 (*)    All other components within normal limits  CULTURE, BLOOD (ROUTINE X 2)  CULTURE, BLOOD (ROUTINE X 2)  HIV ANTIBODY (ROUTINE TESTING W REFLEX)    EKG None  Radiology Dg Hand Complete Left  Result Date: 06/09/2018 CLINICAL DATA:  Pain, redness and swelling of left hand. Possible infection. No  injury. History of IV drug use. EXAM: LEFT HAND - COMPLETE 3+ VIEW COMPARISON:  09/24/2013 FINDINGS: Mild diffuse soft tissue swelling. No air in the soft tissues. Underlying bony structures are within normal. IMPRESSION: No acute bone abnormality.  Mild diffuse soft tissue swelling. Electronically Signed  By: Marin Olp M.D.   On: 06/09/2018 14:12    Procedures Procedures (including critical care time)  Medications Ordered in ED Medications  vancomycin (VANCOCIN) 2,000 mg in sodium chloride 0.9 % 500 mL IVPB (2,000 mg Intravenous New Bag/Given 06/09/18 1354)  ketorolac (TORADOL) 15 MG/ML injection 15 mg (15 mg Intravenous Given 06/09/18 1343)  LORazepam (ATIVAN) injection 1 mg (1 mg Intravenous Given 06/09/18 1343)     Initial Impression / Assessment and Plan / ED Course  I have reviewed the triage vital signs and the nursing notes.  Pertinent labs & imaging results that were available during my care of the patient were reviewed by me and considered in my medical decision making (see chart for details).    47yM with L hand infection as pictured. Clinically cellulitis. Able to range wrist and extend digits reasonably comfortably. I think he needs admitted for IV abx, particularly since I have doubts in terms of his reliability to FU as outpt or take prescribed medications as directed. Discussed with hand surgery. Dr Amedeo Plenty. Requesting that patient be admitted to [facility] hospital and he will see in consultation.   Final Clinical Impressions(s) / ED Diagnoses   Final diagnoses:  Cellulitis of left hand  IVDU (intravenous drug user)    ED Discharge Orders    None       Virgel Manifold, MD 06/10/18 [phone]

## 2018-06-09 NOTE — ED Notes (Signed)
Failed attempts to collect labs. RN has been notified.

## 2018-06-09 NOTE — ED Notes (Signed)
ED TO INPATIENT HANDOFF REPORT  ED Nurse Name and Phone #: [phone] [place], [name] 48 y.o. male Room/Bed: WOTF/NONE  Code Status   Code Status: Full Code  Home/SNF/Other Home Patient oriented to: AOx4 Is this baseline? Yes   Triage Complete: Triage complete  Chief Complaint wound check   Triage Note Patient reports that he has not used IV drugs x 11 days. Patient has left hand and left arm swelling with redness. Patient informed that we needed to take his BP on the left upper arm. Noted that the patient has swelling to the right arm as well.   Allergies Allergies  Allergen Reactions  . Codeine Itching and Nausea Only  . Tylenol [Acetaminophen] Other (See Comments)    headache    Level of Care/Admitting Diagnosis ED Disposition    ED Disposition Condition [facility] Area: [place] [[id number]]  Level of Care: Med-Surg [16]  Covid Evaluation: N/A  Diagnosis: Cellulitis [937902]  Admitting Physician: Terrilee Croak [[id number]]  Attending Physician: Terrilee Croak [[id number]]  Estimated length of stay: past midnight tomorrow  Certification:: I certify this patient will need inpatient services for at least 2 midnights  PT Class (Do Not Modify): Inpatient [101]  PT Acc Code (Do Not Modify): Private [1]       B Medical/Surgery History Past Medical History:  Diagnosis Date  . Anxiety   . Chronic back pain   . Depression   . History of diverticulosis   . History of kidney stones   . Kidney stones   . MRSA (methicillin resistant staph aureus) culture positive   . Substance abuse ([place])    former abuser methodone and oxycodone   Past Surgical History:  Procedure Laterality Date  . I&D of right arm abscess  11/14/2003  . kidney stones       A IV Location/Drains/Wounds Patient Lines/Drains/Airways Status   Active Line/Drains/Airways    Name:   Placement date:   Placement time:   Site:   Days:   Peripheral IV 02/27/18 Right Other (Comment)   02/27/18    1239    Other (Comment)   102   Peripheral IV 06/09/18 Right Forearm   06/09/18    1327    Forearm   less than 1          Intake/Output Last 24 hours  Intake/Output Summary (Last 24 hours) at 06/09/2018 1635 Last data filed at 06/09/2018 1623 Gross per 24 hour  Intake 500 ml  Output -  Net 500 ml    Labs/Imaging Results for orders placed or performed during the hospital encounter of 06/09/18 (from the past 48 hour(s))  CBC with Differential     Status: Abnormal   Collection Time: 06/09/18  1:41 PM  Result Value Ref Range   WBC 9.7 4.0 - 10.5 K/uL   RBC 4.23 4.22 - 5.81 MIL/uL   Hemoglobin 11.7 (L) 13.0 - 17.0 g/dL   HCT 37.6 (L) 39.0 - 52.0 %   MCV 88.9 80.0 - 100.0 fL   MCH 27.7 26.0 - 34.0 pg   MCHC 31.1 30.0 - 36.0 g/dL   RDW 13.3 11.5 - 15.5 %   Platelets 373 150 - 400 K/uL   nRBC 0.0 0.0 - 0.2 %   Neutrophils Relative % 77 %   Neutro Abs 7.4 1.7 - 7.7 K/uL   Lymphocytes Relative 14 %   Lymphs Abs 1.4 0.7 - 4.0 K/uL  Monocytes Relative 7 %   Monocytes Absolute 0.7 0.1 - 1.0 K/uL   Eosinophils Relative 2 %   Eosinophils Absolute 0.2 0.0 - 0.5 K/uL   Basophils Relative 0 %   Basophils Absolute 0.0 0.0 - 0.1 K/uL   Immature Granulocytes 0 %   Abs Immature Granulocytes 0.03 0.00 - 0.07 K/uL    Comment: Performed at [facility], [place] [address], [place], [place] [address]  Basic metabolic panel     Status: Abnormal   Collection Time: 06/09/18  1:41 PM  Result Value Ref Range   Sodium 135 135 - 145 mmol/L   Potassium 4.6 3.5 - 5.1 mmol/L   Chloride 99 98 - 111 mmol/L   CO2 27 22 - 32 mmol/L   Glucose, Bld 95 70 - 99 mg/dL   BUN 25 (H) 6 - 20 mg/dL   Creatinine, Ser 1.28 (H) 0.61 - 1.24 mg/dL   Calcium 8.8 (L) 8.9 - 10.3 mg/dL   GFR calc non Af Amer >60 >60 mL/min   GFR calc Af Amer >60 >60 mL/min   Anion gap 9 5 - 15    Comment: Performed at [facility], [place]  [address], [place], [place] [address]   Dg Hand Complete Left  Result Date: 06/09/2018 CLINICAL DATA:  Pain, redness and swelling of left hand. Possible infection. No injury. History of IV drug use. EXAM: LEFT HAND - COMPLETE 3+ VIEW COMPARISON:  09/24/2013 FINDINGS: Mild diffuse soft tissue swelling. No air in the soft tissues. Underlying bony structures are within normal. IMPRESSION: No acute bone abnormality.  Mild diffuse soft tissue swelling. Electronically Signed   By: Marin Olp M.D.   On: 06/09/2018 14:12    Pending Labs Unresulted Labs (From admission, onward)    Start     Ordered   06/10/18 0500  HIV antibody (Routine Testing)  Tomorrow morning,   R     06/09/18 1448   06/10/18 6503  Basic metabolic panel  Daily,   R     06/09/18 1448   06/10/18 0500  CBC  Daily,   R     06/09/18 1448   06/09/18 1446  CBC  (enoxaparin (LOVENOX)    CrCl >/= 30 ml/min)  Once,   R    Comments:  Baseline for enoxaparin therapy IF NOT ALREADY DRAWN.  Notify MD if PLT < 100 K.    06/09/18 1448   06/09/18 1258  Blood culture (routine x 2)  BLOOD CULTURE X 2,   STAT     06/09/18 1258          Vitals/Pain Today's Vitals   06/09/18 1241 06/09/18 1400 06/09/18 1500 06/09/18 1600  BP: (!) 139/98 114/81 125/71 123/73  Pulse: 100 84 78 80  Resp: 20 20  16   Temp: 98.7 F (37.1 C)     TempSrc: Oral     SpO2: 100% 98% 98% 95%  Weight: 104.3 kg     Height: 5\' 9"  (1.753 m)     PainSc:        Isolation Precautions No active isolations  Medications Medications  sodium chloride flush (NS) 0.9 % injection 3 mL (has no administration in time range)  senna (SENOKOT) tablet 8.6 mg (has no administration in time range)  magnesium hydroxide (MILK OF MAGNESIA) suspension 30 mL (has no administration in time range)  sorbitol 70 % solution 30 mL (has no administration in time range)  sodium phosphate (FLEET) 7-19 GM/118ML enema  1 enema (has no administration in time range)  ondansetron (ZOFRAN) tablet  4 mg (has no administration in time range)    Or  ondansetron (ZOFRAN) injection 4 mg (has no administration in time range)  enoxaparin (LOVENOX) injection 40 mg (has no administration in time range)  0.9 %  sodium chloride infusion (has no administration in time range)  ketorolac (TORADOL) 15 MG/ML injection 15 mg (15 mg Intravenous Given 06/09/18 1343)  LORazepam (ATIVAN) injection 1 mg (1 mg Intravenous Given 06/09/18 1343)  vancomycin (VANCOCIN) 2,000 mg in sodium chloride 0.9 % 500 mL IVPB (0 mg Intravenous Stopped 06/09/18 1623)    Mobility walks Moderate fall risk   Focused Assessments    R Recommendations: See Admitting Provider Note  Report given to:   Additional Notes: Cellulitis left hand

## 2018-06-09 NOTE — Progress Notes (Signed)
During skin assessment patient has multiple scab areas, redness, and swelling of bilateral upper extremities, bilateral lower extremities, right side of neck. Patient states that it is a staph infection, and denies any drug use. MD orders were to elevate BUE, pt refuses, MD aware.

## 2018-06-09 NOTE — Progress Notes (Signed)
Pt highly agitated. Threatening to go home AMA and throwing things in room. Then patient is asleep and hard to arouse. Pt states he hasn't taken anything. Patient does have 2 suit cases in room. Will not let RN look through bags. Frustrated food has not been delivered. RN called kitchen will be delivered in 10 minutes. CN has been notified of situation

## 2018-06-09 NOTE — ED Notes (Signed)
Pt now agreeable to tbe tx via Ambulance to [facility].  Admitting provider made aware, [facility] notified of need for transfer.

## 2018-06-09 NOTE — Progress Notes (Signed)
Pharmacy Antibiotic Note  [name] is a 48 y.o. male admitted on 06/09/2018 with cellulitis.  Pharmacy has been consulted for vancomycin dosing.  Patient was loaded with 2000mg  of vancomycin in the ED.  WBC 9.7, afebrile, Scr 1.28 which appears close to baseline.    Plan: Vancomycin 1750 mg IV Q 24 hrs. Goal AUC 400-550. Expected AUC:522 SCr used: 1.28 Monitor renal function, LOT, deesclation, and vancomycin levels as needed.    Height: 5\' 9"  (175.3 cm) Weight: 230 lb (104.3 kg) IBW/kg (Calculated) : 70.7  Temp (24hrs), Avg:98.6 F (37 C), Min:98.4 F (36.9 C), Max:98.7 F (37.1 C)  Recent Labs  Lab 06/09/18 1341  WBC 9.7  CREATININE 1.28*    Estimated Creatinine Clearance: 85.8 mL/min (A) (by C-G formula based on SCr of 1.28 mg/dL (H)).    Allergies  Allergen Reactions  . Codeine Itching and Nausea Only  . Tylenol [Acetaminophen] Other (See Comments)    headache    Antimicrobials this admission: 4/28 vancomycin >>   Dose adjustments this admission: n/a  Microbiology results: 4/28 BCx: IP  4/28 HIV:  IP   Thank you for allowing pharmacy to be a part of this patient's care.  Azzie Roup D PGY1 Pharmacy Resident  Phone [phone] Please use AMION for clinical pharmacists numbers  06/09/2018      6:03 PM

## 2018-06-09 NOTE — ED Notes (Signed)
Per Admitting provider, pt is concerned about whether insurance will cover cost of ambulance transfer to [facility].  After speaking with SW Junie Panning, pt informed that often times insurance would cover cost of ambulance transfer if transferred deemed medical necessity.  Pt still insistent on going POV to [facility] instead of ambulance.  Pt informed that there would be potential delay in care due to having to check in to [facility] to restart admission process.  Pt verbalized understanding that care may be delayed.  Pt agreeable to stay at [facility] until current infusion of antibiotics is complete.  Admitting provider made aware.

## 2018-06-09 NOTE — ED Notes (Signed)
2 sets of blood cultures collected prior to antibiotic administration. 

## 2018-06-09 NOTE — Consult Note (Signed)
Reason for Consult: Cellulitis left hand.  History of IV drug abuse. Referring Physician: ER staff and hospitalist  [name] is an 48 y.o. male.  HPI: 48 year old male with history of MRSA and multiple medical problems including substance abuse who presents with multiple excoriations over the forearms and hands.  His left hand began having a cellulitic issue today.  He was admitted and I was asked to see in regards to any possible surgical needs.  At present time the patient endorses improvement after 1 round of antibiotics.  He is able to move the fingers and states he is feeling better.  The patient states he is quite drowsy due to the fact that he has not taken his Adderall today.  I reviewed all issues at length.  He states that the last time he used drugs in a intravenous fashion was 10 days ago or so.  He and I discussed all issues at length.  I performed a comprehensive upper extremity exam.  Past Medical History:  Diagnosis Date  . Anxiety   . Chronic back pain   . Depression   . History of diverticulosis   . History of kidney stones   . Kidney stones   . MRSA (methicillin resistant staph aureus) culture positive   . Substance abuse ([place])    former abuser methodone and oxycodone    Past Surgical History:  Procedure Laterality Date  . I&D of right arm abscess  11/14/2003  . kidney stones      Family History  Problem Relation Age of Onset  . Hypertension Mother   . Cancer Maternal Grandmother        ovarian  . Diabetes Paternal Grandfather   . Coronary artery disease Father     Social History:  reports that he has been smoking. He has been smoking about 0.00 packs per day for the past 26.00 years. He has never used smokeless tobacco. He reports current alcohol use. He reports current drug use.  Allergies:  Allergies  Allergen Reactions  . Codeine Itching and Nausea Only  . Tylenol [Acetaminophen] Other (See Comments)    headache    Medications: I  have reviewed the patient's current medications.  Results for orders placed or performed during the hospital encounter of 06/09/18 (from the past 48 hour(s))  CBC with Differential     Status: Abnormal   Collection Time: 06/09/18  1:41 PM  Result Value Ref Range   WBC 9.7 4.0 - 10.5 K/uL   RBC 4.23 4.22 - 5.81 MIL/uL   Hemoglobin 11.7 (L) 13.0 - 17.0 g/dL   HCT 37.6 (L) 39.0 - 52.0 %   MCV 88.9 80.0 - 100.0 fL   MCH 27.7 26.0 - 34.0 pg   MCHC 31.1 30.0 - 36.0 g/dL   RDW 13.3 11.5 - 15.5 %   Platelets 373 150 - 400 K/uL   nRBC 0.0 0.0 - 0.2 %   Neutrophils Relative % 77 %   Neutro Abs 7.4 1.7 - 7.7 K/uL   Lymphocytes Relative 14 %   Lymphs Abs 1.4 0.7 - 4.0 K/uL   Monocytes Relative 7 %   Monocytes Absolute 0.7 0.1 - 1.0 K/uL   Eosinophils Relative 2 %   Eosinophils Absolute 0.2 0.0 - 0.5 K/uL   Basophils Relative 0 %   Basophils Absolute 0.0 0.0 - 0.1 K/uL   Immature Granulocytes 0 %   Abs Immature Granulocytes 0.03 0.00 - 0.07 K/uL    Comment: Performed at  [facility], [place] [address] [place], [place] [address]  Basic metabolic panel     Status: Abnormal   Collection Time: 06/09/18  1:41 PM  Result Value Ref Range   Sodium 135 135 - 145 mmol/L   Potassium 4.6 3.5 - 5.1 mmol/L   Chloride 99 98 - 111 mmol/L   CO2 27 22 - 32 mmol/L   Glucose, Bld 95 70 - 99 mg/dL   BUN 25 (H) 6 - 20 mg/dL   Creatinine, Ser 1.28 (H) 0.61 - 1.24 mg/dL   Calcium 8.8 (L) 8.9 - 10.3 mg/dL   GFR calc non Af Amer >60 >60 mL/min   GFR calc Af Amer >60 >60 mL/min   Anion gap 9 5 - 15    Comment: Performed at [facility], [place] [address] [place], [place] [address]    Dg Hand Complete Left  Result Date: 06/09/2018 CLINICAL DATA:  Pain, redness and swelling of left hand. Possible infection. No injury. History of IV drug use. EXAM: LEFT HAND - COMPLETE 3+ VIEW COMPARISON:  09/24/2013 FINDINGS: Mild diffuse soft tissue swelling. No air in the soft tissues.  Underlying bony structures are within normal. IMPRESSION: No acute bone abnormality.  Mild diffuse soft tissue swelling. Electronically Signed   By: Marin Olp M.D.   On: 06/09/2018 14:12    Review of Systems  Cardiovascular: Negative.   Genitourinary: Negative.    Blood pressure (!) 143/81, pulse 87, temperature 98.4 F (36.9 C), temperature source Oral, resp. rate 18, height 5\' 9"  (1.753 m), weight 104.3 kg, SpO2 98 %. Physical Exam patient has multiple excoriations overlying the upper extremities.  There is no compartment syndrome findings.  There is no obvious pain on fascial planes or evidence of a necrotizing type issue.  His right upper extremity has no erythema, lymphadenopathy or draining wounds.  He does have multiple excoriations and eschars.  I reviewed this at length and the findings.  The left upper extremity is more of the question in terms of a cellulitic component about the dorsal aspect of his left hand.  He has no evidence of infectious tenosynovitis as he is moving his fingers nicely.  The joints themselves have good nontender passive range of motion and there is no evidence of a septic arthritic component at present time.  He does have a cellulitic change to the dorsal aspect of the hand consistent with cellulitis.  His hyperthenar thenar and mid palmar space are without obvious abscess at present time.  There are multiple excoriations and small open lesions throughout the left upper extremity.  He is sensate and does have intact refill.  There is no evidence of bony abnormality on x-ray nor is there any evidence of soft tissue air.  I reviewed this at length.  Assessment/Plan: Cellulitis left hand no obvious abscess at present time.  Given the patient's improvement with 1 dose of antibiotics and his findings I would recommend observation elevation and very aggressive IV antibiotic treatment to try and decrease the cellulitic declaration into an abscess.  We  discussed these issues at length and will monitor his status while in the hospital.  I discussed with patient all issues.  He is in agreement with this plan.  Certainly has a host of challenges in terms of the drug history and he understands this.  We have gone over healthy living habits etc.  I reviewed his x-rays laboratory analysis and findings today at bedside and discussed with him our  plan for treatment of the soft tissue infection with antibiotics at this juncture and watch out for any declaration into an abscess.  Satira Anis Chapman Matteucci III 06/09/2018, 5:56 PM

## 2018-06-09 NOTE — ED Notes (Signed)
Attempted report x1.  Unit made aware that [facility] is at ED to pick up pt.

## 2018-06-09 NOTE — Progress Notes (Signed)
Talked with Dr. Pietro Cassis, is aware of the situation. Stated just to try and leave patient alone at this time and see how the situation resolves.  CN notified.

## 2018-06-10 ENCOUNTER — Encounter (HOSPITAL_COMMUNITY): Payer: Self-pay | Admitting: *Deleted

## 2018-06-10 DIAGNOSIS — F39 Unspecified mood [affective] disorder: Secondary | ICD-10-CM

## 2018-06-10 LAB — BASIC METABOLIC PANEL
Anion gap: 9 (ref 5–15)
BUN: 21 mg/dL — ABNORMAL HIGH (ref 6–20)
CO2: 27 mmol/L (ref 22–32)
Calcium: 8.7 mg/dL — ABNORMAL LOW (ref 8.9–10.3)
Chloride: 102 mmol/L (ref 98–111)
Creatinine, Ser: 1.18 mg/dL (ref 0.61–1.24)
GFR calc Af Amer: >60 mL/min (ref >60)
GFR calc non Af Amer: >60 mL/min (ref >60)
Glucose, Bld: 116 mg/dL — ABNORMAL HIGH (ref 70–99)
Potassium: 4.3 mmol/L (ref 3.5–5.1)
Sodium: 138 mmol/L (ref 135–145)

## 2018-06-10 LAB — CBC
HCT: 32 % — ABNORMAL LOW (ref 39.0–52.0)
Hemoglobin: 10.4 g/dL — ABNORMAL LOW (ref 13.0–17.0)
MCH: 28.2 pg (ref 26.0–34.0)
MCHC: 32.5 g/dL (ref 30.0–36.0)
MCV: 86.7 fL (ref 80.0–100.0)
Platelets: 308 10*3/uL (ref 150–400)
RBC: 3.69 MIL/uL — ABNORMAL LOW (ref 4.22–5.81)
RDW: 13.3 % (ref 11.5–15.5)
WBC: 6.2 10*3/uL (ref 4.0–10.5)
nRBC: 0 % (ref 0.0–0.2)

## 2018-06-10 LAB — HIV ANTIBODY (ROUTINE TESTING W REFLEX): HIV Screen 4th Generation wRfx: NONREACTIVE

## 2018-06-10 NOTE — Progress Notes (Signed)
Patient ID: [name], male   DOB: [birth year], 48 y.o.   MRN: [id number]   LOS: 1 day   Subjective: Pt feels about the same as last night, still improved over presentation.   Objective: Vital signs in last 24 hours: Temp:  [97.4 F (36.3 C)-98.7 F (37.1 C)] 98.2 F (36.8 C) (04/29 0540) Pulse Rate:  [77-100] 83 (04/29 0540) Resp:  [16-20] 18 (04/29 0540) BP: (114-159)/(62-98) 119/62 (04/29 0540) SpO2:  [95 %-100 %] 97 % (04/29 0540) Weight:  [95.3 kg-104.3 kg] 104.3 kg (04/28 1241) Last BM Date: 06/08/18   Laboratory  CBC Recent Labs    06/09/18 1341 06/10/18 0220  WBC 9.7 6.2  HGB 11.7* 10.4*  HCT 37.6* 32.0*  PLT 373 308   BMET Recent Labs    06/09/18 1341 06/10/18 0220  NA 135 138  K 4.6 4.3  CL 99 102  CO2 27 27  GLUCOSE 95 116*  BUN 25* 21*  CREATININE 1.28* 1.18  CALCIUM 8.8* 8.7*     Physical Exam General appearance: alert and no distress  LUE: Dorsum of hand swollen, mod TTP, able to range MCP's, wrist, no fluctuance   Assessment/Plan: Left hand cellulitis -- Continue current management. No surgical intervention indicated at this time.    Lisette Abu, PA-C Orthopedic Surgery [phone] 06/10/2018

## 2018-06-10 NOTE — Progress Notes (Signed)
Patient ID: [name], male   DOB: Nov 14, 1970, 48 y.o.   MRN: [id number] Patient seen at bedside.  The patient has improvement about the dorsal hand but the thenar space still has a fairly significant amount erythema.  No signs of flexor tenosynovitis but his pain response is a bit more significant today about the thumb.  We will see how he does with the antibiotics overnight and consider MRI scan in the a.m. if he is not improved.  The right arm is stable with the area in question in my opinion is the left extensor pollicis longus and thenar space.  The other areas are responding.  I had a long discussion about this with him today.  He is not elevating the hand.  We will try to add a mission sling for him.  I discussed this with nursing staff.  Kynsli Haapala MD

## 2018-06-10 NOTE — Progress Notes (Signed)
Pt seems anxious and impulsive. Prn xanax given(see mar). Pt refuses IVF's at this time. However, he agreed to let me restart him after he felt less anxious. RFA #20g flushes without difficulty. Pt likes the [employer], helps with cooperative behavior. Pt agreed for me to wash and cleanse both arms Mom called to get updates, he does not agree for her to have information at this time. Call light and phone in reach, Monitoring continued per unit protocol and MD orders.

## 2018-06-10 NOTE — Progress Notes (Signed)
PT had asked for nurses and staff to not update mother on his status. Pt is asking for sling to be applied and cleaning once he has ate dinner. Dinner has not arrived yet.

## 2018-06-10 NOTE — Progress Notes (Signed)
PROGRESS NOTE  [name] XLK:[id number] DOB: April 18, 1970 DOA: 06/09/2018 PCP: Patient, No Pcp Per   LOS: 1 day   Patient is from: home  Brief Narrative / Interim history: 48 year old male with history of IVDU, MRSA skin abscesses, anxiety, depression and chronic back pain presented to [facility] ED with complaint of progressive left hand and forearm swelling. He recently was admitted in [facility] and underwent I & D of an abscess on the right hand which he states was after a needle injection  Vital signs and labs not impressive.  Left hand x-ray showed no acute bony abnormalities.  Hand surgery, Dr. Amedeo Plenty consulted in ED requested transfer to [facility].  Cultures drawn.  Started on vancomycin.  Subjective: Reportedly not cooperative with staff and RN overnight.  Has been refusing medications and nursing cares.  Asking for his antibiotic and warm blanket this morning.  Reports pain in his hand.  No other complaints.   Assessment & Plan: Principal Problem:   Cellulitis Active Problems:   IVDU (intravenous drug user)  Left arm cellulitis -Continue current management with IV vancomycin per orthopedic surgery -Follow blood cultures -No stigmata of endocarditis. -Pain control with PRN oxycodone  IVDU: Reportedly clean in the last 11 days. -Cessation counseling. -Check HIV and hepatitis panel  Anxiety/depression/ADHD: Stable -Continue home Celexa, Xanax and Adderall.  Normocytic anemia: slight drop in hemoglobin -Continue trending  Scheduled Meds: . amphetamine-dextroamphetamine  20 mg Oral QID  . citalopram  40 mg Oral Daily  . losartan  25 mg Oral Daily  . senna  1 tablet Oral BID  . sodium chloride flush  3 mL Intravenous Q12H   Continuous Infusions: . sodium chloride Stopped (06/09/18 1819)  . vancomycin 1,750 mg (06/10/18 1341)   PRN Meds:.alprazolam, ibuprofen, magnesium hydroxide, morphine injection, ondansetron **OR** ondansetron (ZOFRAN) IV,  oxyCODONE-acetaminophen, sodium phosphate, sorbitol   DVT prophylaxis: Subcu Lovenox Code Status: Full code Family Communication: None at bedside Disposition Plan: Remains inpatient pending culture.  Will narrow to appropriate oral antibiotic pending culture and improvement  Consultants:   Orthopedic surgery  Procedures:   None  Microbiology: . Blood culture pending  Antimicrobials:  IV vancomycin 4/28--  Objective: Vitals:   06/09/18 1651 06/09/18 2123 06/10/18 0540 06/10/18 1358  BP: (!) 143/81 (!) 159/79 119/62 128/73  Pulse: 87 77 83 (!) 106  Resp: 18 18 18 18   Temp: 98.4 F (36.9 C) (!) 97.4 F (36.3 C) 98.2 F (36.8 C) 98 F (36.7 C)  TempSrc: Oral Oral Oral Oral  SpO2: 98% 97% 97%   Weight:      Height:        Intake/Output Summary (Last 24 hours) at 06/10/2018 1421 Last data filed at 06/10/2018 1100 Gross per 24 hour  Intake 1222.1 ml  Output -  Net 1222.1 ml   Filed Weights   06/09/18 1239 06/09/18 1241  Weight: 95.3 kg 104.3 kg    Examination:  GENERAL: No acute distress.  Appears well.  HEENT: MMM.  Vision and hearing grossly intact.  NECK: Supple.  No JVD.  LUNGS:  No IWOB. Good air movement bilaterally. HEART:  RRR. Heart sounds normal.  No murmurs. ABD: Bowel sounds present. Soft. Non tender.  MSK/EXT:  Moves all extremities.  Swelling in both hands.  Moderate tenderness to palpation.  No palpable fluctuance.  Some limitation with range of motion at fingers due to swelling.  Skin exfoliation/wounds on his hands bilaterally. Erythema distal to his left wrist.  Neurovascular  intact.  See picture below for more. SKIN: Skin exfoliation over his arms bilaterally.   NEURO: Awake, alert and oriented appropriately.  No gross deficit.  PSYCH: Calm. Normal affect.       Data Reviewed: I have independently reviewed following labs and imaging studies  CBC: Recent Labs  Lab 06/09/18 1341 06/10/18 0220  WBC 9.7 6.2  NEUTROABS 7.4  --   HGB  11.7* 10.4*  HCT 37.6* 32.0*  MCV 88.9 86.7  PLT 373 654   Basic Metabolic Panel: Recent Labs  Lab 06/09/18 1341 06/10/18 0220  NA 135 138  K 4.6 4.3  CL 99 102  CO2 27 27  GLUCOSE 95 116*  BUN 25* 21*  CREATININE 1.28* 1.18  CALCIUM 8.8* 8.7*   GFR: Estimated Creatinine Clearance: 93 mL/min (by C-G formula based on SCr of 1.18 mg/dL). Liver Function Tests: No results for input(s): AST, ALT, ALKPHOS, BILITOT, PROT, ALBUMIN in the last 168 hours. No results for input(s): LIPASE, AMYLASE in the last 168 hours. No results for input(s): AMMONIA in the last 168 hours. Coagulation Profile: No results for input(s): INR, PROTIME in the last 168 hours. Cardiac Enzymes: No results for input(s): CKTOTAL, CKMB, CKMBINDEX, TROPONINI in the last 168 hours. BNP (last 3 results) No results for input(s): PROBNP in the last 8760 hours. HbA1C: No results for input(s): HGBA1C in the last 72 hours. CBG: No results for input(s): GLUCAP in the last 168 hours. Lipid Profile: No results for input(s): CHOL, HDL, LDLCALC, TRIG, CHOLHDL, LDLDIRECT in the last 72 hours. Thyroid Function Tests: No results for input(s): TSH, T4TOTAL, FREET4, T3FREE, THYROIDAB in the last 72 hours. Anemia Panel: No results for input(s): VITAMINB12, FOLATE, FERRITIN, TIBC, IRON, RETICCTPCT in the last 72 hours. Urine analysis:    Component Value Date/Time   COLORURINE YELLOW 12/01/2008 0412   APPEARANCEUR CLOUDY (A) 12/01/2008 0412   LABSPEC 1.015 12/01/2008 0412   PHURINE 8.5 (H) 12/01/2008 0412   GLUCOSEU NEGATIVE 12/01/2008 0412   HGBUR NEGATIVE 12/01/2008 0412   BILIRUBINUR NEGATIVE 12/01/2008 0412   KETONESUR NEGATIVE 12/01/2008 0412   PROTEINUR NEGATIVE 12/01/2008 0412   UROBILINOGEN 0.2 12/01/2008 0412   NITRITE NEGATIVE 12/01/2008 0412   LEUKOCYTESUR  12/01/2008 0412    NEGATIVE MICROSCOPIC NOT DONE ON URINES WITH NEGATIVE PROTEIN, BLOOD, LEUKOCYTES, NITRITE, OR GLUCOSE <1000 mg/dL.   Sepsis Labs:  Invalid input(s): PROCALCITONIN, LACTICIDVEN  Recent Results (from the past 240 hour(s))  Blood culture (routine x 2)     Status: None (Preliminary result)   Collection Time: 06/09/18  1:30 PM  Result Value Ref Range Status   Specimen Description   Final    BLOOD RIGHT FOREARM Performed at [facility] [address], [place], [place] [address]    Special Requests   Final    BOTTLES DRAWN AEROBIC AND ANAEROBIC Blood Culture results may not be optimal due to an excessive volume of blood received in culture bottles Performed at [facility] [address] [place], [place] [address]    Culture   Final    NO GROWTH < 24 HOURS Performed at [facility] [address] [place], [place] [address]    Report Status PENDING  Incomplete  Blood culture (routine x 2)     Status: None (Preliminary result)   Collection Time: 06/09/18  1:41 PM  Result Value Ref Range Status   Specimen Description   Final    BLOOD LEFT ANTECUBITAL Performed at [facility]  [facility] [address], [place], [place] [address]    Special Requests   Final    BOTTLES DRAWN AEROBIC AND ANAEROBIC Blood Culture results may not be optimal due to an excessive volume of blood received in culture bottles Performed at [facility] [address] [place], [place] [address]    Culture   Final    NO GROWTH < 24 HOURS Performed at [facility] [address], [place], [place] [address]    Report Status PENDING  Incomplete      Radiology Studies: No results found.   T. [facility] Pager [phone]  If 7PM-7AM, please contact night-coverage [email or web address] Password [email or web address] 06/10/2018, 2:21 PM

## 2018-06-10 NOTE — Progress Notes (Signed)
Orthopedic Tech Progress Note Patient Details:  [name] Feb 26, 1970 [id number]  Ortho Devices Type of Ortho Device: Arm sling Ortho Device/Splint Interventions: Adjustment, Application, Ordered   Post Interventions Patient Tolerated: Well Instructions Provided: Care of device, Adjustment of device   [name] 06/10/2018, 7:06 PM

## 2018-06-10 NOTE — Plan of Care (Signed)
  Problem: Pain Managment: Goal: General experience of comfort will improve Outcome: Progressing   Problem: Coping: Goal: Level of anxiety will decrease Outcome: Progressing   

## 2018-06-10 NOTE — Plan of Care (Signed)

## 2018-06-11 DIAGNOSIS — R7989 Other specified abnormal findings of blood chemistry: Secondary | ICD-10-CM

## 2018-06-11 LAB — BASIC METABOLIC PANEL
Anion gap: 10 (ref 5–15)
BUN: 15 mg/dL (ref 6–20)
CO2: 26 mmol/L (ref 22–32)
Calcium: 9.1 mg/dL (ref 8.9–10.3)
Chloride: 103 mmol/L (ref 98–111)
Creatinine, Ser: 1.32 mg/dL — ABNORMAL HIGH (ref 0.61–1.24)
GFR calc Af Amer: >60 mL/min (ref >60)
GFR calc non Af Amer: >60 mL/min (ref >60)
Glucose, Bld: 98 mg/dL (ref 70–99)
Potassium: 4.5 mmol/L (ref 3.5–5.1)
Sodium: 139 mmol/L (ref 135–145)

## 2018-06-11 LAB — CBC
HCT: 36.3 % — ABNORMAL LOW (ref 39.0–52.0)
Hemoglobin: 11.6 g/dL — ABNORMAL LOW (ref 13.0–17.0)
MCH: 27.9 pg (ref 26.0–34.0)
MCHC: 32 g/dL (ref 30.0–36.0)
MCV: 87.3 fL (ref 80.0–100.0)
Platelets: 333 10*3/uL (ref 150–400)
RBC: 4.16 MIL/uL — ABNORMAL LOW (ref 4.22–5.81)
RDW: 13.2 % (ref 11.5–15.5)
WBC: 7.1 10*3/uL (ref 4.0–10.5)
nRBC: 0 % (ref 0.0–0.2)

## 2018-06-11 LAB — HIV ANTIBODY (ROUTINE TESTING W REFLEX): HIV Screen 4th Generation wRfx: NONREACTIVE

## 2018-06-11 MED ORDER — CEPHALEXIN 500 MG PO CAPS: 500.0000 mg | ORAL_CAPSULE | Freq: Four times a day (QID) | ORAL | 0 refills | Status: DC

## 2018-06-11 MED ORDER — CEPHALEXIN 500 MG PO CAPS
500.0000 mg | ORAL_CAPSULE | Freq: Four times a day (QID) | ORAL | Status: DC
  Administered 2018-06-11 (×2): 500 mg via ORAL
  Filled 2018-06-11 (×2): qty 1

## 2018-06-11 MED ORDER — ACETAMINOPHEN 325 MG PO TABS: 650.0000 mg | ORAL_TABLET | Freq: Four times a day (QID) | ORAL | Status: DC | PRN

## 2018-06-11 MED FILL — CEPHALEXIN 500 MG CAPSULE: 500 | 15 days supply | Qty: 60 | Fill #0

## 2018-06-11 NOTE — Discharge Summary (Signed)
Physician Discharge Summary  [name] VOZ:[id number] DOB: 1970/04/01 DOA: 06/09/2018  PCP: Patient, No Pcp Per  Admit date: 06/09/2018 Discharge date: 06/11/2018  Admitted From: Home Disposition: Home  Recommendations for Outpatient Follow-up:  1. Follow up with PCP in 1-2 weeks 2. Follow-up with orthopedic surgery in 3 to 4 weeks or sooner if needed 3. Please obtain CBC/BMP/Mag at follow up 4. Please follow up on the following pending results: Hepatitis panel  Home Health: None Equipment/Devices: None  Discharge Condition: Stable CODE STATUS: Full code   Hospital Course: 48 year old male with history of IVDU, skin wound, anxiety, depression and chronic back pain presented to [facility] ED with complaint of progressive left hand and forearm swelling. He recently was admitted in [facility] and underwent I & D of an abscess on the right hand which he states was after a needle injection  In ED, vital signs and labs not impressive.  Left hand x-ray showed no acute bony abnormalities.  Hand surgery, Dr. Amedeo Plenty consulted in ED requested transfer to [facility].  Cultures drawn.  Started on vancomycin. Patient was evaluated by orthopedic surgery, Dr. Amedeo Plenty. Blood cultures negative.  No leukocytosis.  No fever.  Hand swelling and erythema improved.  Antibiotic de-escalated to Keflex on 06/11/2018.  Initially, the plan was to observe him on Keflex overnight but patient chose to go home.  He was evaluated by Dr. Amedeo Plenty who is on board with discharge home on oral Keflex at least for 2 weeks.  He was given strict return precautions.  Patient to follow-up with PCP and orthopedic surgery outpatient.  See individual problem list below for more.  Discharge Diagnoses:  Principal Problem:   Cellulitis Active Problems:   IVDU (intravenous drug user)  Left arm/hand cellulitis-improving Right arm wound -Blood cultures negative.    -No leukocytosis, fever, abscesses or stigmata of  endocarditis .  -IV vancomycin 4/28-4/30 -Discharged home on Keflex 4/30-5/15 -PCP and orthopedic surgery follow-up in 2 to 3 weeks  IVDU: Reportedly clean in the last 11 days.  HIV negative. -Cessation counseling. -Follow hepatitis panel   Anxiety/depression/ADHD: Stable -Continue home Celexa, Xanax and Adderall.  Normocytic anemia: Stable except for initial slight drop in hemoglobin. -Recheck CBC at follow-up  Mild elevated creatinine: Likely due to vancomycin, ibuprofen and losartan which have been discontinued at discharge. -Repeat BMP at follow-up Discharge Instructions  Discharge Instructions    Call MD for:  persistant nausea and vomiting   Complete by:  As directed    Call MD for:  redness, tenderness, or signs of infection (pain, swelling, redness, odor or green/yellow discharge around incision site)   Complete by:  As directed    Call MD for:  severe uncontrolled pain   Complete by:  As directed    Call MD for:  temperature >100.4   Complete by:  As directed    Diet general   Complete by:  As directed    Discharge instructions   Complete by:  As directed    It has been a pleasure taking care of you! You were admitted with cellulitis/wound infection.  We started you on antibiotic, and we are discharging you more antibiotic that you need to continue taking until you complete the whole course for 2 weeks.  Please follow-up with your primary care doctor in 1 to 2 weeks.  Also recommend follow-up with orthopedic surgery in 2 to 3 weeks.  Please seek immediate care if you have worsening of symptoms or other  symptoms concerning to you.  There might be some changes to your medications during this hospitalization.  Please read your medication list and the directions carefully before you take them.  Once you are discharged, your primary care physician will handle any further medical issues. Please note that NO REFILLS for any discharge medications will be authorized once you  are discharged, as it is imperative that you return to your primary care physician (or establish a relationship with a primary care physician if you do not have one) for your aftercare needs so that they can reassess your need for medications and monitor your lab values. Take care,   Increase activity slowly   Complete by:  As directed      Allergies as of 06/11/2018      Reactions   Codeine Itching, Nausea Only   Tylenol [acetaminophen] Other (See Comments)   headache      Medication List    STOP taking these medications   doxycycline 100 MG capsule Commonly known as:  VIBRAMYCIN   gabapentin 300 MG capsule Commonly known as:  NEURONTIN   ibuprofen 200 MG tablet Commonly known as:  ADVIL   losartan 25 MG tablet Commonly known as:  COZAAR     TAKE these medications   acetaminophen 325 MG tablet Commonly known as:  TYLENOL Take 2 tablets (650 mg total) by mouth every 6 (six) hours as needed for mild pain.   alprazolam 2 MG tablet Commonly known as:  XANAX TAKE 1 TABLET BY MOUTH 3 TIMES DAILY AS NEEDED FOR ANXIETY What changed:    how much to take  how to take this  when to take this  reasons to take this   amphetamine-dextroamphetamine 20 MG tablet Commonly known as:  ADDERALL Take 20 mg by mouth 4 (four) times daily.   cephALEXin 500 MG capsule Commonly known as:  KEFLEX Take 1 capsule (500 mg total) by mouth every 6 (six) hours. What changed:  when to take this   citalopram 40 MG tablet Commonly known as:  CELEXA Take 1 tablet (40 mg total) by mouth daily.   clobetasol ointment 0.05 % Commonly known as:  TEMOVATE Apply 1 application topically 2 (two) times daily as needed (red spots on face).   sildenafil 20 MG tablet Commonly known as:  REVATIO TAKE 1/2-2 tabs ON EMPTY stomach 1 hour BEFORE sex. USE lowest possible DOSE. No alcohol OR benzodiazepines What changed:    how much to take  how to take this  when to take this  reasons to take this       Follow-up Information    Roseanne Kaufman, MD. Schedule an appointment as soon as possible for a visit in 2 week(s).   Specialty:  Orthopedic Surgery Contact information: [address] [place] [address] [phone]           Consultations:  Orthopedic surgery  Procedures/Studies:  2D Echo: None  Dg Hand Complete Left  Result Date: 06/09/2018 CLINICAL DATA:  Pain, redness and swelling of left hand. Possible infection. No injury. History of IV drug use. EXAM: LEFT HAND - COMPLETE 3+ VIEW COMPARISON:  09/24/2013 FINDINGS: Mild diffuse soft tissue swelling. No air in the soft tissues. Underlying bony structures are within normal. IMPRESSION: No acute bone abnormality.  Mild diffuse soft tissue swelling. Electronically Signed   By: Marin Olp M.D.   On: 06/09/2018 14:12     Subjective: No major events overnight of this morning.  Some improvement  in his swelling but is still obese erythema in left hand.  Right hand stable.  Still with some pain.  Denies numbness or tingling.   Discharge Exam: Vitals:   06/11/18 0614 06/11/18 1351  BP: 104/69 128/71  Pulse: 75 89  Resp: 17 18  Temp: 97.9 F (36.6 C) (!) 97.5 F (36.4 C)  SpO2: 95% 95%   GENERAL: No acute distress.  Appears well.  HEENT: MMM.  Vision and hearing grossly intact.  NECK: Supple.  No JVD.  LUNGS:  No IWOB. Good air movement bilaterally. HEART:  RRR. Heart sounds normal.  No murmurs. ABD: Bowel sounds present. Soft. Non tender.  MSK/EXT:  Moves all extremities.   Erythema left hand and swelling in both hands improved. Moderate tenderness to palpation.  No palpable fluctuance.  Neurovascular intact.  Able to make complete fist in right, incomplete in left.  See picture below for more. SKIN: Scattered skin scabs/lesions over his forearms.  See below for more. NEURO: Awake, alert and oriented appropriately.  No gross deficit.  PSYCH: Calm. Normal affect.   On 06/10/2018    On  06/11/2018     The results of significant diagnostics from this hospitalization (including imaging, microbiology, ancillary and laboratory) are listed below for reference.     Microbiology: Recent Results (from the past 240 hour(s))  Blood culture (routine x 2)     Status: None (Preliminary result)   Collection Time: 06/09/18  1:30 PM  Result Value Ref Range Status   Specimen Description   Final    BLOOD RIGHT FOREARM Performed at [facility] [address], [place], [place] [address]    Special Requests   Final    BOTTLES DRAWN AEROBIC AND ANAEROBIC Blood Culture results may not be optimal due to an excessive volume of blood received in culture bottles Performed at [facility] [address] [place], [place] [address]    Culture   Final    NO GROWTH 2 DAYS Performed at [facility] [address] [place], [place] [address]    Report Status PENDING  Incomplete  Blood culture (routine x 2)     Status: None (Preliminary result)   Collection Time: 06/09/18  1:41 PM  Result Value Ref Range Status   Specimen Description   Final    BLOOD LEFT ANTECUBITAL Performed at [facility] [address], [place], [place] [address]    Special Requests   Final    BOTTLES DRAWN AEROBIC AND ANAEROBIC Blood Culture results may not be optimal due to an excessive volume of blood received in culture bottles Performed at [facility] [address], [place], [place] [address]    Culture   Final    NO GROWTH 2 DAYS Performed at [facility] [address], [place], [place] [address]    Report Status PENDING  Incomplete     Labs: BNP (last 3 results) No results for input(s): BNP in the last 8760 hours. Basic Metabolic Panel: Recent Labs  Lab 06/09/18 1341 06/10/18 0220 06/11/18 0401  NA 135 138 139  K 4.6 4.3 4.5  CL 99 102 103  CO2 27 27 26   GLUCOSE 95 116* 98  BUN 25* 21* 15   CREATININE 1.28* 1.18 1.32*  CALCIUM 8.8* 8.7* 9.1   Liver Function Tests: No results for input(s): AST, ALT, ALKPHOS, BILITOT, PROT, ALBUMIN in the last 168 hours. No results for input(s): LIPASE, AMYLASE  in the last 168 hours. No results for input(s): AMMONIA in the last 168 hours. CBC: Recent Labs  Lab 06/09/18 1341 06/10/18 0220 06/11/18 0401  WBC 9.7 6.2 7.1  NEUTROABS 7.4  --   --   HGB 11.7* 10.4* 11.6*  HCT 37.6* 32.0* 36.3*  MCV 88.9 86.7 87.3  PLT 373 308 333   Cardiac Enzymes: No results for input(s): CKTOTAL, CKMB, CKMBINDEX, TROPONINI in the last 168 hours. BNP: Invalid input(s): POCBNP CBG: No results for input(s): GLUCAP in the last 168 hours. D-Dimer No results for input(s): DDIMER in the last 72 hours. Hgb A1c No results for input(s): HGBA1C in the last 72 hours. Lipid Profile No results for input(s): CHOL, HDL, LDLCALC, TRIG, CHOLHDL, LDLDIRECT in the last 72 hours. Thyroid function studies No results for input(s): TSH, T4TOTAL, T3FREE, THYROIDAB in the last 72 hours.  Invalid input(s): FREET3 Anemia work up No results for input(s): VITAMINB12, FOLATE, FERRITIN, TIBC, IRON, RETICCTPCT in the last 72 hours. Urinalysis    Component Value Date/Time   COLORURINE YELLOW 12/01/2008 0412   APPEARANCEUR CLOUDY (A) 12/01/2008 0412   LABSPEC 1.015 12/01/2008 0412   PHURINE 8.5 (H) 12/01/2008 0412   GLUCOSEU NEGATIVE 12/01/2008 0412   HGBUR NEGATIVE 12/01/2008 0412   BILIRUBINUR NEGATIVE 12/01/2008 0412   KETONESUR NEGATIVE 12/01/2008 0412   PROTEINUR NEGATIVE 12/01/2008 0412   UROBILINOGEN 0.2 12/01/2008 0412   NITRITE NEGATIVE 12/01/2008 0412   LEUKOCYTESUR  12/01/2008 0412    NEGATIVE MICROSCOPIC NOT DONE ON URINES WITH NEGATIVE PROTEIN, BLOOD, LEUKOCYTES, NITRITE, OR GLUCOSE <1000 mg/dL.   Sepsis Labs Invalid input(s): PROCALCITONIN,  WBC,  LACTICIDVEN   Time coordinating discharge: 25 minutes  SIGNED:  Mercy Riding, MD  [facility]  06/11/2018, 4:05 PM Pager [phone]  If 7PM-7AM, please contact night-coverage [email or web address] Password [email or web address]

## 2018-06-11 NOTE — Consult Note (Signed)
  Patient is seen and examined at bedside.  His right arm is stable his left arm is markedly improved.  He is much better than yesterday.  His pain is resolved.  He still has some focal erythema about the dorsal radial aspect of the thenar region but the thenar space is soft as is the mid palm and hyperthenar space.  I feel that the cellulitis is still present but improved.  I do feel a transition to p.o. antibiotics is acceptable but we will keep a close eye on this and watch for any declaration into a issue of worsening to suggest abscess.  At present time the patient and I had a long conversation about this today and I do feel comfortable with this approach to care.  He is completed vancomycin.  He states he can take the antibiotics at home and watch this closely.  If he has any worsening have asked him to notify us immediately as I would be happy to see him.  I will be in my office Monday and he can check in at that time for evaluation to make sure things are looking well.  Aleeha Boline MD

## 2018-06-11 NOTE — Progress Notes (Signed)
Pt given discharge instructions. Gone over with him and he verbalized understanding. Antibiotic prescription delivered to room, pt instructed on when to take it again today. All belongings gathered to be sent home. Pt waiting on ride.

## 2018-06-11 NOTE — Progress Notes (Signed)
PROGRESS NOTE  [name] STM:[id number] DOB: 1970-04-15 DOA: 06/09/2018 PCP: Patient, No Pcp Per   LOS: 2 days   Patient is from: home  Brief Narrative / Interim history: 48 year old male with history of IVDU, MRSA skin abscesses, anxiety, depression and chronic back pain presented to [facility] ED with complaint of progressive left hand and forearm swelling. He recently was admitted in [facility] and underwent I & D of an abscess on the right hand which he states was after a needle injection  In ED, vital signs and labs not impressive.  Left hand x-ray showed no acute bony abnormalities.  Hand surgery, Dr. Amedeo Plenty consulted in ED requested transfer to [facility].  Cultures drawn.  Started on vancomycin. Patient was evaluated by orthopedic surgery, Dr. Amedeo Plenty. Blood cultures negative.  No leukocytosis.  No fever.  Hand swelling and erythema improved.  Antibiotic de-escalated to Keflex on 06/11/2018  Subjective: No major events overnight of this morning.  Some improvement in his swelling but is still obese erythema in left hand.  Right hand stable.  Still with some pain.  Denies numbness or tingling.   Assessment & Plan: Principal Problem:   Cellulitis Active Problems:   IVDU (intravenous drug user)  Left arm/hand cellulitis-improving Right arm wound -Blood cultures negative.  No signs of abscesses.  No leukocytosis or fever. -IV vancomycin 4/28-4/30 -Keflex 4/30- -No stigmata of endocarditis. -Pain control with PRN Tylenol and oxycodone -Follow orthopedic surgery recommendations  IVDU: Reportedly clean in the last 11 days.  HIV negative. -Cessation counseling. -Follow hepatitis panel  Anxiety/depression/ADHD: Stable -Continue home Celexa, Xanax and Adderall.  Normocytic anemia: Stable except for initial slight drop in hemoglobin. -Continue trending as needed.  Scheduled Meds: . amphetamine-dextroamphetamine  20 mg Oral QID  . cephALEXin  500 mg Oral Q6H  .  citalopram  40 mg Oral Daily  . senna  1 tablet Oral BID  . sodium chloride flush  3 mL Intravenous Q12H   Continuous Infusions: . sodium chloride Stopped (06/11/18 0148)   PRN Meds:.acetaminophen, alprazolam, magnesium hydroxide, morphine injection, ondansetron **OR** ondansetron (ZOFRAN) IV, oxyCODONE-acetaminophen, sodium phosphate, sorbitol   DVT prophylaxis: Subcu Lovenox Code Status: Full code Family Communication: None at bedside Disposition Plan: Remains inpatient.  Will observe on p.o. Keflex overnight.  If remains stable, and cleared by orthopedic surgery, anticipate discharge in the next 24hrs  Consultants:   Orthopedic surgery  Procedures:   None  Microbiology: . Blood culture-negative . HIV negative  Antimicrobials:  IV vancomycin 4/28--4/30  Keflex 4/30--  Objective: Vitals:   06/09/18 2123 06/10/18 0540 06/10/18 1358 06/11/18 0614  BP:  119/62 128/73 104/69  Pulse: 77 83 (!) 106 75  Resp: 18 18 18 17   Temp:  98.2 F (36.8 C) 98 F (36.7 C) 97.9 F (36.6 C)  TempSrc: Oral Oral Oral Oral  SpO2: 97% 97% 100% 95%  Weight:      Height:        Intake/Output Summary (Last 24 hours) at 06/11/2018 1249 Last data filed at 06/11/2018 0836 Gross per 24 hour  Intake 2844.11 ml  Output -  Net 2844.11 ml   Filed Weights   06/09/18 1239 06/09/18 1241  Weight: 95.3 kg 104.3 kg    Examination:  GENERAL: No acute distress.  Appears well.  HEENT: MMM.  Vision and hearing grossly intact.  NECK: Supple.  No JVD.  LUNGS:  No IWOB. Good air movement bilaterally. HEART:  RRR. Heart sounds normal.  No murmurs.  ABD: Bowel sounds present. Soft. Non tender.  MSK/EXT:  Moves all extremities.   Erythema left hand and swelling in both hands improved. Moderate tenderness to palpation.  No palpable fluctuance.  Neurovascular intact.  Able to make complete fist in right, incomplete in left.  See picture below for more. SKIN: Scattered skin scabs/lesions over his  forearms.  See below for more. NEURO: Awake, alert and oriented appropriately.  No gross deficit.  PSYCH: Calm. Normal affect.   On 06/10/2018    On 06/11/2018    Data Reviewed: I have independently reviewed following labs and imaging studies  CBC: Recent Labs  Lab 06/09/18 1341 06/10/18 0220 06/11/18 0401  WBC 9.7 6.2 7.1  NEUTROABS 7.4  --   --   HGB 11.7* 10.4* 11.6*  HCT 37.6* 32.0* 36.3*  MCV 88.9 86.7 87.3  PLT 373 308 309   Basic Metabolic Panel: Recent Labs  Lab 06/09/18 1341 06/10/18 0220 06/11/18 0401  NA 135 138 139  K 4.6 4.3 4.5  CL 99 102 103  CO2 27 27 26   GLUCOSE 95 116* 98  BUN 25* 21* 15  CREATININE 1.28* 1.18 1.32*  CALCIUM 8.8* 8.7* 9.1   GFR: Estimated Creatinine Clearance: 83.2 mL/min (A) (by C-G formula based on SCr of 1.32 mg/dL (H)). Liver Function Tests: No results for input(s): AST, ALT, ALKPHOS, BILITOT, PROT, ALBUMIN in the last 168 hours. No results for input(s): LIPASE, AMYLASE in the last 168 hours. No results for input(s): AMMONIA in the last 168 hours. Coagulation Profile: No results for input(s): INR, PROTIME in the last 168 hours. Cardiac Enzymes: No results for input(s): CKTOTAL, CKMB, CKMBINDEX, TROPONINI in the last 168 hours. BNP (last 3 results) No results for input(s): PROBNP in the last 8760 hours. HbA1C: No results for input(s): HGBA1C in the last 72 hours. CBG: No results for input(s): GLUCAP in the last 168 hours. Lipid Profile: No results for input(s): CHOL, HDL, LDLCALC, TRIG, CHOLHDL, LDLDIRECT in the last 72 hours. Thyroid Function Tests: No results for input(s): TSH, T4TOTAL, FREET4, T3FREE, THYROIDAB in the last 72 hours. Anemia Panel: No results for input(s): VITAMINB12, FOLATE, FERRITIN, TIBC, IRON, RETICCTPCT in the last 72 hours. Urine analysis:    Component Value Date/Time   COLORURINE YELLOW 12/01/2008 0412   APPEARANCEUR CLOUDY (A) 12/01/2008 0412   LABSPEC 1.015 12/01/2008 0412   PHURINE  8.5 (H) 12/01/2008 0412   GLUCOSEU NEGATIVE 12/01/2008 0412   HGBUR NEGATIVE 12/01/2008 0412   BILIRUBINUR NEGATIVE 12/01/2008 0412   KETONESUR NEGATIVE 12/01/2008 0412   PROTEINUR NEGATIVE 12/01/2008 0412   UROBILINOGEN 0.2 12/01/2008 0412   NITRITE NEGATIVE 12/01/2008 0412   LEUKOCYTESUR  12/01/2008 0412    NEGATIVE MICROSCOPIC NOT DONE ON URINES WITH NEGATIVE PROTEIN, BLOOD, LEUKOCYTES, NITRITE, OR GLUCOSE <1000 mg/dL.   Sepsis Labs: Invalid input(s): PROCALCITONIN, LACTICIDVEN  Recent Results (from the past 240 hour(s))  Blood culture (routine x 2)     Status: None (Preliminary result)   Collection Time: 06/09/18  1:30 PM  Result Value Ref Range Status   Specimen Description   Final    BLOOD RIGHT FOREARM Performed at [facility] [address] [place], [place] [address]    Special Requests   Final    BOTTLES DRAWN AEROBIC AND ANAEROBIC Blood Culture results may not be optimal due to an excessive volume of blood received in culture bottles Performed at [facility] [address], [place], [place] [address]    Culture  Final    NO GROWTH 2 DAYS Performed at [facility], [place] [address] [place], [place] [address]    Report Status PENDING  Incomplete  Blood culture (routine x 2)     Status: None (Preliminary result)   Collection Time: 06/09/18  1:41 PM  Result Value Ref Range Status   Specimen Description   Final    BLOOD LEFT ANTECUBITAL Performed at [facility] [address] [place], [place] [address]    Special Requests   Final    BOTTLES DRAWN AEROBIC AND ANAEROBIC Blood Culture results may not be optimal due to an excessive volume of blood received in culture bottles Performed at [facility] [address] [place], [place] [address]    Culture   Final    NO GROWTH 2 DAYS Performed at [facility] [address], [place], [place] [address]    Report Status PENDING   Incomplete      Radiology Studies: No results found.  Taye T. [facility] Pager [phone]  If 7PM-7AM, please contact night-coverage [email or web address] Password [facility] 06/11/2018, 12:49 PM

## 2018-06-12 LAB — HEPATITIS PANEL, ACUTE
HCV Ab: <0.1 s/co ratio (ref 0.0–0.9)
Hep A IgM: NEGATIVE
Hep B C IgM: NEGATIVE
Hepatitis B Surface Ag: NEGATIVE

## 2018-06-14 LAB — CULTURE, BLOOD (ROUTINE X 2)
Culture: NO GROWTH
Culture: NO GROWTH

## 2018-07-19 ENCOUNTER — Emergency Department (HOSPITAL_COMMUNITY): Payer: BLUE CROSS/BLUE SHIELD

## 2018-07-19 ENCOUNTER — Inpatient Hospital Stay (HOSPITAL_COMMUNITY): Payer: BLUE CROSS/BLUE SHIELD

## 2018-07-19 ENCOUNTER — Encounter (HOSPITAL_COMMUNITY): Payer: Self-pay

## 2018-07-19 ENCOUNTER — Inpatient Hospital Stay (HOSPITAL_COMMUNITY)
Admission: EM | Admit: 2018-07-19 | Discharge: 2018-07-21 | DRG: 603 | Disposition: A | Discharge status: Home / Self Care | Payer: BLUE CROSS/BLUE SHIELD | Attending: Internal Medicine | Admitting: Internal Medicine

## 2018-07-19 ENCOUNTER — Other Ambulatory Visit: Payer: Self-pay

## 2018-07-19 DIAGNOSIS — Z720 Tobacco use: Secondary | ICD-10-CM | POA: Diagnosis not present

## 2018-07-19 DIAGNOSIS — F1910 Other psychoactive substance abuse, uncomplicated: Secondary | ICD-10-CM | POA: Diagnosis not present

## 2018-07-19 DIAGNOSIS — L03114 Cellulitis of left upper limb: Secondary | ICD-10-CM

## 2018-07-19 DIAGNOSIS — G8929 Other chronic pain: Secondary | ICD-10-CM | POA: Diagnosis present

## 2018-07-19 DIAGNOSIS — Z6830 Body mass index (BMI) 30.0-30.9, adult: Secondary | ICD-10-CM | POA: Diagnosis not present

## 2018-07-19 DIAGNOSIS — D638 Anemia in other chronic diseases classified elsewhere: Secondary | ICD-10-CM | POA: Diagnosis present

## 2018-07-19 DIAGNOSIS — S41102A Unspecified open wound of left upper arm, initial encounter: Secondary | ICD-10-CM | POA: Diagnosis not present

## 2018-07-19 DIAGNOSIS — F988 Other specified behavioral and emotional disorders with onset usually occurring in childhood and adolescence: Secondary | ICD-10-CM | POA: Diagnosis present

## 2018-07-19 DIAGNOSIS — Z8614 Personal history of Methicillin resistant Staphylococcus aureus infection: Secondary | ICD-10-CM | POA: Diagnosis not present

## 2018-07-19 DIAGNOSIS — F329 Major depressive disorder, single episode, unspecified: Secondary | ICD-10-CM | POA: Diagnosis present

## 2018-07-19 DIAGNOSIS — L03119 Cellulitis of unspecified part of limb: Secondary | ICD-10-CM | POA: Diagnosis not present

## 2018-07-19 DIAGNOSIS — Z872 Personal history of diseases of the skin and subcutaneous tissue: Secondary | ICD-10-CM | POA: Diagnosis not present

## 2018-07-19 DIAGNOSIS — Z8249 Family history of ischemic heart disease and other diseases of the circulatory system: Secondary | ICD-10-CM | POA: Diagnosis not present

## 2018-07-19 DIAGNOSIS — F1990 Other psychoactive substance use, unspecified, uncomplicated: Secondary | ICD-10-CM | POA: Diagnosis not present

## 2018-07-19 DIAGNOSIS — Z833 Family history of diabetes mellitus: Secondary | ICD-10-CM

## 2018-07-19 DIAGNOSIS — F902 Attention-deficit hyperactivity disorder, combined type: Secondary | ICD-10-CM

## 2018-07-19 DIAGNOSIS — F17210 Nicotine dependence, cigarettes, uncomplicated: Secondary | ICD-10-CM | POA: Diagnosis present

## 2018-07-19 DIAGNOSIS — M549 Dorsalgia, unspecified: Secondary | ICD-10-CM | POA: Diagnosis present

## 2018-07-19 DIAGNOSIS — F419 Anxiety disorder, unspecified: Secondary | ICD-10-CM | POA: Diagnosis present

## 2018-07-19 DIAGNOSIS — Z1159 Encounter for screening for other viral diseases: Secondary | ICD-10-CM

## 2018-07-19 DIAGNOSIS — F1490 Cocaine use, unspecified, uncomplicated: Secondary | ICD-10-CM | POA: Diagnosis present

## 2018-07-19 DIAGNOSIS — Z79899 Other long term (current) drug therapy: Secondary | ICD-10-CM | POA: Diagnosis not present

## 2018-07-19 DIAGNOSIS — Z888 Allergy status to other drugs, medicaments and biological substances status: Secondary | ICD-10-CM

## 2018-07-19 DIAGNOSIS — G894 Chronic pain syndrome: Secondary | ICD-10-CM | POA: Diagnosis present

## 2018-07-19 DIAGNOSIS — Z87442 Personal history of urinary calculi: Secondary | ICD-10-CM | POA: Diagnosis not present

## 2018-07-19 DIAGNOSIS — Z885 Allergy status to narcotic agent status: Secondary | ICD-10-CM

## 2018-07-19 DIAGNOSIS — F909 Attention-deficit hyperactivity disorder, unspecified type: Secondary | ICD-10-CM | POA: Diagnosis present

## 2018-07-19 DIAGNOSIS — I82409 Acute embolism and thrombosis of unspecified deep veins of unspecified lower extremity: Secondary | ICD-10-CM | POA: Diagnosis not present

## 2018-07-19 DIAGNOSIS — X58XXXA Exposure to other specified factors, initial encounter: Secondary | ICD-10-CM | POA: Diagnosis not present

## 2018-07-19 DIAGNOSIS — L989 Disorder of the skin and subcutaneous tissue, unspecified: Secondary | ICD-10-CM | POA: Diagnosis not present

## 2018-07-19 DIAGNOSIS — S41101A Unspecified open wound of right upper arm, initial encounter: Secondary | ICD-10-CM | POA: Diagnosis not present

## 2018-07-19 DIAGNOSIS — F1510 Other stimulant abuse, uncomplicated: Secondary | ICD-10-CM | POA: Diagnosis not present

## 2018-07-19 DIAGNOSIS — L0390 Cellulitis, unspecified: Secondary | ICD-10-CM | POA: Diagnosis present

## 2018-07-19 DIAGNOSIS — L818 Other specified disorders of pigmentation: Secondary | ICD-10-CM | POA: Diagnosis not present

## 2018-07-19 DIAGNOSIS — L03113 Cellulitis of right upper limb: Secondary | ICD-10-CM | POA: Diagnosis present

## 2018-07-19 DIAGNOSIS — F411 Generalized anxiety disorder: Secondary | ICD-10-CM | POA: Diagnosis not present

## 2018-07-19 DIAGNOSIS — N179 Acute kidney failure, unspecified: Secondary | ICD-10-CM | POA: Diagnosis present

## 2018-07-19 DIAGNOSIS — Z8041 Family history of malignant neoplasm of ovary: Secondary | ICD-10-CM

## 2018-07-19 DIAGNOSIS — L089 Local infection of the skin and subcutaneous tissue, unspecified: Secondary | ICD-10-CM | POA: Diagnosis not present

## 2018-07-19 DIAGNOSIS — F1110 Opioid abuse, uncomplicated: Secondary | ICD-10-CM | POA: Diagnosis not present

## 2018-07-19 LAB — CBC WITH DIFFERENTIAL/PLATELET
Abs Immature Granulocytes: 0.01 10*3/uL (ref 0.00–0.07)
Basophils Absolute: 0 10*3/uL (ref 0.0–0.1)
Basophils Relative: 0 %
Eosinophils Absolute: 0.4 10*3/uL (ref 0.0–0.5)
Eosinophils Relative: 5 %
HCT: 32.2 % — ABNORMAL LOW (ref 39.0–52.0)
Hemoglobin: 10.1 g/dL — ABNORMAL LOW (ref 13.0–17.0)
Immature Granulocytes: 0 %
Lymphocytes Relative: 17 %
Lymphs Abs: 1.3 10*3/uL (ref 0.7–4.0)
MCH: 27 pg (ref 26.0–34.0)
MCHC: 31.4 g/dL (ref 30.0–36.0)
MCV: 86.1 fL (ref 80.0–100.0)
Monocytes Absolute: 0.6 10*3/uL (ref 0.1–1.0)
Monocytes Relative: 8 %
Neutro Abs: 5.3 10*3/uL (ref 1.7–7.7)
Neutrophils Relative %: 70 %
Platelets: 379 10*3/uL (ref 150–400)
RBC: 3.74 MIL/uL — ABNORMAL LOW (ref 4.22–5.81)
RDW: 13.9 % (ref 11.5–15.5)
WBC: 7.6 10*3/uL (ref 4.0–10.5)
nRBC: 0 % (ref 0.0–0.2)

## 2018-07-19 LAB — COMPREHENSIVE METABOLIC PANEL
ALT: 17 U/L (ref 0–44)
AST: 20 U/L (ref 15–41)
Albumin: 3.2 g/dL — ABNORMAL LOW (ref 3.5–5.0)
Alkaline Phosphatase: 78 U/L (ref 38–126)
Anion gap: 14 (ref 5–15)
BUN: 21 mg/dL — ABNORMAL HIGH (ref 6–20)
CO2: 23 mmol/L (ref 22–32)
Calcium: 8.7 mg/dL — ABNORMAL LOW (ref 8.9–10.3)
Chloride: 102 mmol/L (ref 98–111)
Creatinine, Ser: 1.51 mg/dL — ABNORMAL HIGH (ref 0.61–1.24)
GFR calc Af Amer: >60 mL/min (ref >60)
GFR calc non Af Amer: 55 mL/min — ABNORMAL LOW (ref >60)
Glucose, Bld: 129 mg/dL — ABNORMAL HIGH (ref 70–99)
Potassium: 5 mmol/L (ref 3.5–5.1)
Sodium: 139 mmol/L (ref 135–145)
Total Bilirubin: 0.3 mg/dL (ref 0.3–1.2)
Total Protein: 7.1 g/dL (ref 6.5–8.1)

## 2018-07-19 LAB — CBC
HCT: 28.9 % — ABNORMAL LOW (ref 39.0–52.0)
Hemoglobin: 9.3 g/dL — ABNORMAL LOW (ref 13.0–17.0)
MCH: 27.4 pg (ref 26.0–34.0)
MCHC: 32.2 g/dL (ref 30.0–36.0)
MCV: 85.3 fL (ref 80.0–100.0)
Platelets: 327 10*3/uL (ref 150–400)
RBC: 3.39 MIL/uL — ABNORMAL LOW (ref 4.22–5.81)
RDW: 14 % (ref 11.5–15.5)
WBC: 6.7 10*3/uL (ref 4.0–10.5)
nRBC: 0 % (ref 0.0–0.2)

## 2018-07-19 LAB — URINALYSIS, ROUTINE W REFLEX MICROSCOPIC
Bilirubin Urine: NEGATIVE
Glucose, UA: NEGATIVE mg/dL
Hgb urine dipstick: NEGATIVE
Ketones, ur: NEGATIVE mg/dL
Leukocytes,Ua: NEGATIVE
Nitrite: NEGATIVE
Protein, ur: NEGATIVE mg/dL
Specific Gravity, Urine: 1.023 (ref 1.005–1.030)
pH: 6 (ref 5.0–8.0)

## 2018-07-19 LAB — RAPID URINE DRUG SCREEN, HOSP PERFORMED
Amphetamines: POSITIVE — AB
Barbiturates: NOT DETECTED
Benzodiazepines: NOT DETECTED
Cocaine: NOT DETECTED
Opiates: POSITIVE — AB
Tetrahydrocannabinol: NOT DETECTED

## 2018-07-19 LAB — TSH: TSH: 4.553 u[IU]/mL — ABNORMAL HIGH (ref 0.350–4.500)

## 2018-07-19 LAB — CREATININE, SERUM
Creatinine, Ser: 1.14 mg/dL (ref 0.61–1.24)
GFR calc Af Amer: >60 mL/min (ref >60)
GFR calc non Af Amer: >60 mL/min (ref >60)

## 2018-07-19 LAB — LACTIC ACID, PLASMA
Lactic Acid, Venous: 1.1 mmol/L (ref 0.5–1.9)
Lactic Acid, Venous: 0.5 mmol/L (ref 0.5–1.9)

## 2018-07-19 LAB — SARS CORONAVIRUS 2 BY RT PCR (HOSPITAL ORDER, PERFORMED IN ~~LOC~~ HOSPITAL LAB): SARS Coronavirus 2: NEGATIVE

## 2018-07-19 LAB — T4, FREE: Free T4: 0.96 ng/dL (ref 0.82–1.77)

## 2018-07-19 MED ORDER — SODIUM CHLORIDE 0.9 % IV SOLN
2.0000 g | INTRAVENOUS | Status: DC
  Filled 2018-07-19: qty 20

## 2018-07-19 MED ORDER — VANCOMYCIN HCL IN DEXTROSE 1-5 GM/200ML-% IV SOLN
1000.0000 mg | Freq: Two times a day (BID) | INTRAVENOUS | Status: DC
  Administered 2018-07-19 – 2018-07-21 (×4): 1000 mg via INTRAVENOUS
  Filled 2018-07-19 (×4): qty 200

## 2018-07-19 MED ORDER — HEPARIN SODIUM (PORCINE) 5000 UNIT/ML IJ SOLN
5000.0000 [IU] | Freq: Three times a day (TID) | INTRAMUSCULAR | Status: DC
  Administered 2018-07-19 – 2018-07-20 (×3): 5000 [IU] via SUBCUTANEOUS
  Filled 2018-07-19 (×5): qty 1

## 2018-07-19 MED ORDER — ONDANSETRON HCL 4 MG/2ML IJ SOLN
4.0000 mg | Freq: Four times a day (QID) | INTRAMUSCULAR | Status: DC | PRN
  Administered 2018-07-20: 4 mg via INTRAVENOUS
  Filled 2018-07-19: qty 2

## 2018-07-19 MED ORDER — TRAMADOL HCL 50 MG PO TABS: 100.0000 mg | ORAL_TABLET | Freq: Four times a day (QID) | ORAL | Status: DC | PRN

## 2018-07-19 MED ORDER — ONDANSETRON HCL 4 MG PO TABS: 4.0000 mg | ORAL_TABLET | Freq: Four times a day (QID) | ORAL | Status: DC | PRN

## 2018-07-19 MED ORDER — LACTATED RINGERS IV BOLUS: 1000.0000 mL | Freq: Once | INTRAVENOUS | Status: DC

## 2018-07-19 MED ORDER — CLINDAMYCIN PHOSPHATE 600 MG/50ML IV SOLN
600.0000 mg | Freq: Once | INTRAVENOUS | Status: AC
  Administered 2018-07-19: 600 mg via INTRAVENOUS
  Filled 2018-07-19: qty 50

## 2018-07-19 MED ORDER — MORPHINE SULFATE (PF) 2 MG/ML IV SOLN
1.0000 mg | INTRAVENOUS | Status: DC | PRN
  Administered 2018-07-19 – 2018-07-21 (×10): 2 mg via INTRAVENOUS
  Filled 2018-07-19 (×10): qty 1

## 2018-07-19 MED ORDER — MORPHINE SULFATE (PF) 2 MG/ML IV SOLN
2.0000 mg | INTRAVENOUS | Status: DC | PRN
  Administered 2018-07-19: 2 mg via INTRAVENOUS
  Filled 2018-07-19: qty 1

## 2018-07-19 MED ORDER — SODIUM CHLORIDE 0.9 % IV SOLN
INTRAVENOUS | Status: DC
  Administered 2018-07-19 – 2018-07-21 (×5): via INTRAVENOUS

## 2018-07-19 MED ORDER — VANCOMYCIN HCL 10 G IV SOLR
2000.0000 mg | Freq: Once | INTRAVENOUS | Status: AC
  Administered 2018-07-19: 2000 mg via INTRAVENOUS
  Filled 2018-07-19: qty 2000

## 2018-07-19 NOTE — Progress Notes (Signed)
  [name] is a 48 y.o. male with medical history significant of IV drug abuse with recurrent cellulitis, generalized anxiety disorder, ADHD, chronic pain syndrome, previous history of MRSA, kidney stones who presented to the ER with diffuse multiple skin wounds on the upper extremities and swelling and tenderness of the upper extremities.  Pt has a h/o MRSA infection in the past.  Please see detailed H&p by Dr Jonelle Sidle  Pt seen and examined and discussed the plan.  Get venous duplex of the upper extremities.  Will get x rays of the upper extremities.  Requested ID consult.  No areas of abscess.  Added rocephin.    Continue to monitor blood cultures, pending UDS.   Hosie Poisson, MD [phone]

## 2018-07-19 NOTE — ED Triage Notes (Signed)
Pt states that he has a staff infection of his L foot that has been there since January with yellow drainage, just finished antibiotics last week for the same, pt reports IV drug use until last month. Some swelling noted to L foot

## 2018-07-19 NOTE — ED Notes (Signed)
Will be a delay in fluids and antibiotics d/t inability to obtain IV access. IV team consult placed.

## 2018-07-19 NOTE — ED Notes (Signed)
ED TO INPATIENT HANDOFF REPORT  ED Nurse Name and Phone #: Sharrie Rothman [phone]  S Name/Age/Gender [name] 48 y.o. male Room/Bed: 022C/022C  Code Status   Code Status: Prior  Home/SNF/Other Home Patient oriented to: self, place, time and situation Is this baseline? Yes   Triage Complete: Triage complete  Chief Complaint staff infection  Triage Note Pt states that he has a staff infection of his L foot that has been there since January with yellow drainage, just finished antibiotics last week for the same, pt reports IV drug use until last month. Some swelling noted to L foot   Pt has multiple abscesses all over his arms, redness and swelling noted    Allergies Allergies  Allergen Reactions  . Codeine Itching and Nausea Only  . Tylenol [Acetaminophen] Other (See Comments)    headache    Level of Care/Admitting Diagnosis ED Disposition    ED Disposition Condition [facility] Area: [place] [[id number]]  Level of Care: Med-Surg [16]  Covid Evaluation: N/A  Diagnosis: Cellulitis [209470]  Admitting Physician: Elwyn Reach [[id number]]  Attending Physician: Elwyn Reach [[id number]]  Estimated length of stay: past midnight tomorrow  Certification:: I certify this patient will need inpatient services for at least 2 midnights  PT Class (Do Not Modify): Inpatient [101]  PT Acc Code (Do Not Modify): Private [1]       B Medical/Surgery History Past Medical History:  Diagnosis Date  . Anxiety   . Chronic back pain   . Depression   . History of diverticulosis   . History of kidney stones   . Kidney stones   . MRSA (methicillin resistant staph aureus) culture positive   . Substance abuse ([place])    former abuser methodone and oxycodone   Past Surgical History:  Procedure Laterality Date  . I&D of right arm abscess  11/14/2003  . kidney stones       A IV Location/Drains/Wounds Patient Lines/Drains/Airways Status   Active  Line/Drains/Airways    Name:   Placement date:   Placement time:   Site:   Days:   Peripheral IV 02/27/18 Right Other (Comment)   02/27/18    1239    Other (Comment)   142   Peripheral IV 07/19/18 Right;Upper Arm   07/19/18    0504    Arm   less than 1   Wound / Incision (Open or Dehisced) 06/09/18 Non-pressure wound Hand Left Scabbed over wound, open to air, cellulitis per MD   06/09/18    1847    Hand   40   Wound / Incision (Open or Dehisced) 06/09/18 Non-pressure wound Hand Right;Other (Comment) scabbed area, red, swollen, edges are pink   06/09/18    1909    Hand   40   Wound / Incision (Open or Dehisced) 06/09/18 Non-pressure wound;Other (Comment) Foot Anterior;Right scabbed area with swelling   06/09/18    1915    Foot   40          Intake/Output Last 24 hours No intake or output data in the 24 hours ending 07/19/18 0513  Labs/Imaging Results for orders placed or performed during the hospital encounter of 07/19/18 (from the past 48 hour(s))  Lactic acid, plasma     Status: None   Collection Time: 07/19/18  2:09 AM  Result Value Ref Range   Lactic Acid, Venous 1.1 0.5 - 1.9 mmol/L    Comment: Performed at  [facility], [place] [address] [place], [place] [address]  Comprehensive metabolic panel     Status: Abnormal   Collection Time: 07/19/18  2:09 AM  Result Value Ref Range   Sodium 139 135 - 145 mmol/L   Potassium 5.0 3.5 - 5.1 mmol/L   Chloride 102 98 - 111 mmol/L   CO2 23 22 - 32 mmol/L   Glucose, Bld 129 (H) 70 - 99 mg/dL   BUN 21 (H) 6 - 20 mg/dL   Creatinine, Ser 1.51 (H) 0.61 - 1.24 mg/dL   Calcium 8.7 (L) 8.9 - 10.3 mg/dL   Total Protein 7.1 6.5 - 8.1 g/dL   Albumin 3.2 (L) 3.5 - 5.0 g/dL   AST 20 15 - 41 U/L   ALT 17 0 - 44 U/L   Alkaline Phosphatase 78 38 - 126 U/L   Total Bilirubin 0.3 0.3 - 1.2 mg/dL   GFR calc non Af Amer 55 (L) >60 mL/min   GFR calc Af Amer >60 >60 mL/min   Anion gap 14 5 - 15    Comment: Performed at [facility]  [address], [place], [place] [address]  CBC with Differential     Status: Abnormal   Collection Time: 07/19/18  2:09 AM  Result Value Ref Range   WBC 7.6 4.0 - 10.5 K/uL   RBC 3.74 (L) 4.22 - 5.81 MIL/uL   Hemoglobin 10.1 (L) 13.0 - 17.0 g/dL   HCT 32.2 (L) 39.0 - 52.0 %   MCV 86.1 80.0 - 100.0 fL   MCH 27.0 26.0 - 34.0 pg   MCHC 31.4 30.0 - 36.0 g/dL   RDW 13.9 11.5 - 15.5 %   Platelets 379 150 - 400 K/uL   nRBC 0.0 0.0 - 0.2 %   Neutrophils Relative % 70 %   Neutro Abs 5.3 1.7 - 7.7 K/uL   Lymphocytes Relative 17 %   Lymphs Abs 1.3 0.7 - 4.0 K/uL   Monocytes Relative 8 %   Monocytes Absolute 0.6 0.1 - 1.0 K/uL   Eosinophils Relative 5 %   Eosinophils Absolute 0.4 0.0 - 0.5 K/uL   Basophils Relative 0 %   Basophils Absolute 0.0 0.0 - 0.1 K/uL   Immature Granulocytes 0 %   Abs Immature Granulocytes 0.01 0.00 - 0.07 K/uL    Comment: Performed at [facility] [address], [place], [place] [address]  Urinalysis, Routine w reflex microscopic     Status: None   Collection Time: 07/19/18  2:10 AM  Result Value Ref Range   Color, Urine YELLOW YELLOW   APPearance CLEAR CLEAR   Specific Gravity, Urine 1.023 1.005 - 1.030   pH 6.0 5.0 - 8.0   Glucose, UA NEGATIVE NEGATIVE mg/dL   Hgb urine dipstick NEGATIVE NEGATIVE   Bilirubin Urine NEGATIVE NEGATIVE   Ketones, ur NEGATIVE NEGATIVE mg/dL   Protein, ur NEGATIVE NEGATIVE mg/dL   Nitrite NEGATIVE NEGATIVE   Leukocytes,Ua NEGATIVE NEGATIVE    Comment: Microscopic not done on urines with negative protein, blood, leukocytes, nitrite, or glucose < 500 mg/dL. Performed at [facility], [place] [address], [place], [place] [address]    No results found.  Pending Labs Unresulted Labs (From admission, onward)    Start     Ordered   07/19/18 0417  SARS Coronavirus 2 (CEPHEID - Performed in [facility] lab), [place]  (Asymptomatic Patients Labs)  Once,   R    Question:  Rule Out  Answer:  Yes   07/19/18 0416   07/19/18  0416  Blood culture (routine x 2)  BLOOD CULTURE X 2,   STAT     07/19/18 0416   07/19/18 0124  Lactic acid, plasma  Now then every 2 hours,   STAT     07/19/18 0123          Vitals/Pain Today's Vitals   07/19/18 0119 07/19/18 0350  BP: (!) 185/81   Pulse: (!) 102   Resp: 18   Temp: 98.5 F (36.9 C)   TempSrc: Oral   SpO2: 100%   PainSc: 8  8     Isolation Precautions No active isolations  Medications Medications  lactated ringers bolus 1,000 mL (has no administration in time range)  clindamycin (CLEOCIN) IVPB 600 mg (600 mg Intravenous New Bag/Given 07/19/18 5784)    Mobility walks Low fall risk   Focused Assessments Cellulitis   R Recommendations: See Admitting Provider Note  Report given to:   Additional Notes: Pt has cellulitis to b/l UE.  Pt admits to IV drug use.  States he has been clean x 1 month.  UE are edematous.  Pt is A&O x 4.  Pt reports the pain is severe and would like something strong to help ease the pain. Antibiotics infusing at this time. Will hold LR as it is not compatible.

## 2018-07-19 NOTE — ED Provider Notes (Signed)
Emergency Department Provider Note   I have reviewed the triage vital signs and the nursing notes.   HISTORY  Chief Complaint Foot Pain and Arm Pain   HPI IRVING BLOOR II is a 48 y.o. male with multi-medical problems documented below who presents the emergency department today secondary to significant abscesses and cellulitis to bilateral upper extremities.  The patient states he was in the hospital and was discharged a month ago on oral antibiotics for an abscess and infection to his hand.  Everything seemed to improve with that but then over the last week after being off antibiotics progressively worsened were both arms reviewed facility swollen, tender and has had some fevers at home subjectively.   Will use IV drugs for over a month but has been MRSA positive in the past.  No other associated or modifying symptoms.    Past Medical History:  Diagnosis Date  . Anxiety   . Chronic back pain   . Depression   . History of diverticulosis   . History of kidney stones   . Kidney stones   . MRSA (methicillin resistant staph aureus) culture positive   . Substance abuse ([place])    former abuser methodone and oxycodone    Patient Active Problem List   Diagnosis Date Noted  . Cellulitis 06/09/2018  . IVDU (intravenous drug user) 06/09/2018  . ADD (attention deficit disorder) 02/02/2015  . Insomnia 04/08/2012  . Substance abuse in remission ([place]) 04/08/2012  . GAD (generalized anxiety disorder) 11/02/2011  . Chronic back pain 11/02/2011    Past Surgical History:  Procedure Laterality Date  . I&D of right arm abscess  11/14/2003  . kidney stones      Current Outpatient Rx  . Order #: [id number] Class: OTC  . Order #: [id number] Class: Print  . Order #: [id number] Class: Historical Med  . Order #: [id number] Class: Normal  . Order #: [id number] Class: Historical Med  . Order #: [id number] Class: Normal  . Order #: [id number] Class: Normal    Allergies Codeine and Tylenol  [acetaminophen]  Family History  Problem Relation Age of Onset  . Hypertension Mother   . Cancer Maternal Grandmother        ovarian  . Diabetes Paternal Grandfather   . Coronary artery disease Father     Social History Social History   Tobacco Use  . Smoking status: Light Tobacco Smoker    Packs/day: 0.00    Years: 26.00    Pack years: 0.00    Last attempt to quit: 04/05/2015    Years since quitting: 3.2  . Smokeless tobacco: Never Used  Substance Use Topics  . Alcohol use: Yes    Alcohol/week: 0.0 standard drinks    Comment: 3 times per week  . Drug use: Yes    Comment: heroin daily    Review of Systems  All other systems negative except as documented in the HPI. All pertinent positives and negatives as reviewed in the HPI. ____________________________________________   PHYSICAL EXAM:  VITAL SIGNS: ED Triage Vitals [07/19/18 0119]  Enc Vitals Group     BP (!) 185/81     Pulse Rate (!) 102     Resp 18     Temp 98.5 F (36.9 C)     Temp Source Oral     SpO2 100 %    Constitutional: Alert and oriented. Well appearing and in no acute distress. Eyes: Conjunctivae are normal. PERRL. EOMI. Head: Atraumatic. Nose: No congestion/rhinnorhea. Mouth/Throat: Mucous membranes are moist.  Oropharynx  non-erythematous. Neck: No stridor.  No meningeal signs.   Cardiovascular: Normal rate, regular rhythm. Good peripheral circulation. Grossly normal heart sounds.   Respiratory: Normal respiratory effort.  No retractions. Lungs CTAB. Gastrointestinal: Soft and nontender. No distention.  Musculoskeletal: No lower extremity tenderness nor edema. No gross deformities of extremities. Neurologic:  Normal speech and language. No gross focal neurologic deficits are appreciated.  Skin:  BUE almost diffusely tender, indurated, warm, red and edematous. No pitting. Multiple areas of self expressed abscesses.    ____________________________________________   LABS (all labs ordered  are listed, but only abnormal results are displayed)  Labs Reviewed  COMPREHENSIVE METABOLIC PANEL - Abnormal; Notable for the following components:      Result Value   Glucose, Bld 129 (*)    BUN 21 (*)    Creatinine, Ser 1.51 (*)    Calcium 8.7 (*)    Albumin 3.2 (*)    GFR calc non Af Amer 55 (*)    All other components within normal limits  CBC WITH DIFFERENTIAL/PLATELET - Abnormal; Notable for the following components:   RBC 3.74 (*)    Hemoglobin 10.1 (*)    HCT 32.2 (*)    All other components within normal limits  SARS CORONAVIRUS 2 (HOSPITAL ORDER, [facility])  CULTURE, BLOOD (ROUTINE X 2)  CULTURE, BLOOD (ROUTINE X 2)  LACTIC ACID, PLASMA  URINALYSIS, ROUTINE W REFLEX MICROSCOPIC  LACTIC ACID, PLASMA   ____________________________________________  RADIOLOGY  Dg Chest Portable 1 View  Result Date: 07/19/2018 CLINICAL DATA:  Skin infection EXAM: PORTABLE CHEST 1 VIEW COMPARISON:  04/10/2018 FINDINGS: The heart size and mediastinal contours are within normal limits. Both lungs are clear. The visualized skeletal structures are unremarkable. IMPRESSION: No active disease. Electronically Signed   By: Ulyses Jarred M.D.   On: 07/19/2018 05:22   Dg Foot Complete Left  Result Date: 07/19/2018 CLINICAL DATA:  Infection EXAM: LEFT FOOT - COMPLETE 3+ VIEW COMPARISON:  None. FINDINGS: There is no evidence of fracture or dislocation. There is no evidence of arthropathy or other focal bone abnormality. Soft tissues are unremarkable. IMPRESSION: Negative. Electronically Signed   By: Ulyses Jarred M.D.   On: 07/19/2018 05:21    ____________________________________________   PROCEDURES  Procedure(s) performed:   Procedures   ____________________________________________   INITIAL IMPRESSION / ASSESSMENT AND PLAN / ED COURSE  Suspect possible endocarditis or other deep-seated infection as a cause for this recurring infections as he is on appropriate  anabiotic for 3 weeks previously and it came right back some possibly could have just even a further resistant staph aureus.  Discussed options with the patient he prefers to be admitted.  Discussed with hospitalist who will admit     Pertinent labs & imaging results that were available during my care of the patient were reviewed by me and considered in my medical decision making (see chart for details). ____________________________________________  FINAL CLINICAL IMPRESSION(S) / ED DIAGNOSES  Final diagnoses:  Cellulitis of upper extremity, unspecified laterality     MEDICATIONS GIVEN DURING THIS VISIT:  Medications  lactated ringers bolus 1,000 mL (has no administration in time range)  clindamycin (CLEOCIN) IVPB 600 mg (600 mg Intravenous Transfusing/Transfer 07/19/18 0540)     NEW OUTPATIENT MEDICATIONS STARTED DURING THIS VISIT:  New Prescriptions   No medications on file    Note:  This note was prepared with assistance of Dragon voice recognition software. Occasional wrong-word or sound-a-like substitutions may have occurred due to  the inherent limitations of voice recognition software.   Glennie Rodda, Corene Cornea, MD 07/19/18 [phone]

## 2018-07-19 NOTE — H&P (Signed)
History and Physical   [name] JSH:[id number] DOB: [birth year] DOA: 07/19/2018  Referring MD/NP/PA: Dr. Dayna Barker  PCP: Patient, No Pcp Per   Outpatient Specialists: None  Patient coming from: Home  Chief Complaint: Multiple skin lesions  HPI: [name] is a 48 y.o. male with medical history significant of IV drug abuse with recurrent cellulitis, generalized anxiety disorder, ADHD, chronic pain syndrome, previous history of MRSA, kidney stones who presented to the ER with diffuse generalized skin lesions.  Patient has had cellulitis initially on the left foot which was apparently treated previously.  He is notably been on Keflex thereafter.  Today he has bilateral upper extremity diffuse carbuncles and furuncles and cellulitis of both lower and upper extremities.  He has had some subjective fevers and chills.  Denied any recent IV drug abuse.  Patient suspected of recurrent MRSA infections and possible colonization or even endocarditis..  ED Course: Temperature 98.5 blood pressure 184/81 pulse 102 respirate of 19 oxygen sat 97% on room air.  White count 7.6 hemoglobin 10.1 platelets 379.  Sodium is 139 potassium 5.0 chloride 102 CO2 23 with a BUN 20 creatinine 1.51 calcium 8.7 glucose 129.  COVID-19 testing is negative.  Blood cultures obtained and patient is being admitted for treatment  Review of Systems: As per HPI otherwise 10 point review of systems negative.    Past Medical History:  Diagnosis Date   Anxiety    Chronic back pain    Depression    History of diverticulosis    History of kidney stones    Kidney stones    MRSA (methicillin resistant staph aureus) culture positive    Substance abuse ([place])    former abuser methodone and oxycodone    Past Surgical History:  Procedure Laterality Date   I&D of right arm abscess  11/14/2003   kidney stones       reports that he has been smoking. He has been smoking about 0.00 packs per day for the past  26.00 years. He has never used smokeless tobacco. He reports current alcohol use. He reports current drug use.  Allergies  Allergen Reactions   Codeine Itching and Nausea Only   Tylenol [Acetaminophen] Other (See Comments)    headache    Family History  Problem Relation Age of Onset   Hypertension Mother    Cancer Maternal Grandmother        ovarian   Diabetes Paternal Grandfather    Coronary artery disease Father      Prior to Admission medications   Medication Sig Start Date End Date Taking? Authorizing Provider  acetaminophen (TYLENOL) 325 MG tablet Take 2 tablets (650 mg total) by mouth every 6 (six) hours as needed for mild pain. 06/11/18  Yes Mercy Riding, MD  alprazolam (XANAX) 2 MG tablet TAKE 1 TABLET BY MOUTH 3 TIMES DAILY AS NEEDED FOR ANXIETY Patient taking differently: Take 2 mg by mouth 3 (three) times daily as needed for anxiety. TAKE 1 TABLET BY MOUTH 3 TIMES DAILY AS NEEDED FOR ANXIETY 12/21/15  Yes Weber, Sarah L, PA-C  amphetamine-dextroamphetamine (ADDERALL) 20 MG tablet Take 20 mg by mouth 4 (four) times daily.  01/31/14  Yes [provider]  citalopram (CELEXA) 40 MG tablet Take 1 tablet (40 mg total) by mouth daily. 05/24/15  Yes Leandrew Koyanagi, MD  clobetasol ointment (TEMOVATE) 8.50 % Apply 1 application topically 2 (two) times daily as needed (red spots on face).   Yes [provider]  sildenafil (REVATIO) 20 MG tablet TAKE 1/2-2 tabs ON EMPTY stomach 1 hour BEFORE sex. USE lowest possible DOSE. No alcohol OR benzodiazepines Patient taking differently: Take 10-20 mg by mouth as needed (erectile dysfunction). TAKE 1/2-2 tabs ON EMPTY stomach 1 hour BEFORE sex. USE lowest possible DOSE. No alcohol OR benzodiazepines 09/26/17  Yes Vivi Barrack, MD  cephALEXin (KEFLEX) 500 MG capsule Take 1 capsule (500 mg total) by mouth every 6 (six) hours. Patient not taking: Reported on 07/19/2018 06/11/18   Mercy Riding, MD    Physical  Exam: Vitals:   07/19/18 0119 07/19/18 0415 07/19/18 0430  BP: (!) 185/81 132/86 136/84  Pulse: (!) 102 83 83  Resp: 18 17 16   Temp: 98.5 F (36.9 C)    TempSrc: Oral    SpO2: 100% 97% 98%      Constitutional: NAD, calm, acutely ill looking Vitals:   07/19/18 0119 07/19/18 0415 07/19/18 0430  BP: (!) 185/81 132/86 136/84  Pulse: (!) 102 83 83  Resp: 18 17 16   Temp: 98.5 F (36.9 C)    TempSrc: Oral    SpO2: 100% 97% 98%   Eyes: PERRL, lids and conjunctivae normal ENMT: Mucous membranes are moist. Posterior pharynx clear of any exudate or lesions.Normal dentition.  Neck: normal, supple, no masses, no thyromegaly Respiratory: clear to auscultation bilaterally, no wheezing, no crackles. Normal respiratory effort. No accessory muscle use.  Cardiovascular: Regular rate and rhythm, no murmurs / rubs / gallops. No extremity edema. 2+ pedal pulses. No carotid bruits.  Abdomen: no tenderness, no masses palpated. No hepatosplenomegaly. Bowel sounds positive.  Musculoskeletal: no clubbing / cyanosis. No joint deformity upper and lower extremities. Good ROM, no contractures. Normal muscle tone.  Skin: Diffuse multiple eruptions in the upper arms, left lower extremity, parts of the trunk, red swollen left lower extremity Neurologic: CN 2-12 grossly intact. Sensation intact, DTR normal. Strength 5/5 in all 4.  Psychiatric: Normal judgment and insight. Alert and oriented x 3. Normal mood.     Labs on Admission: I have personally reviewed following labs and imaging studies  CBC: Recent Labs  Lab 07/19/18 0209  WBC 7.6  NEUTROABS 5.3  HGB 10.1*  HCT 32.2*  MCV 86.1  PLT 202   Basic Metabolic Panel: Recent Labs  Lab 07/19/18 0209  NA 139  K 5.0  CL 102  CO2 23  GLUCOSE 129*  BUN 21*  CREATININE 1.51*  CALCIUM 8.7*   GFR: CrCl cannot be calculated (Unknown ideal weight.). Liver Function Tests: Recent Labs  Lab 07/19/18 0209  AST 20  ALT 17  ALKPHOS 78  BILITOT  0.3  PROT 7.1  ALBUMIN 3.2*   No results for input(s): LIPASE, AMYLASE in the last 168 hours. No results for input(s): AMMONIA in the last 168 hours. Coagulation Profile: No results for input(s): INR, PROTIME in the last 168 hours. Cardiac Enzymes: No results for input(s): CKTOTAL, CKMB, CKMBINDEX, TROPONINI in the last 168 hours. BNP (last 3 results) No results for input(s): PROBNP in the last 8760 hours. HbA1C: No results for input(s): HGBA1C in the last 72 hours. CBG: No results for input(s): GLUCAP in the last 168 hours. Lipid Profile: No results for input(s): CHOL, HDL, LDLCALC, TRIG, CHOLHDL, LDLDIRECT in the last 72 hours. Thyroid Function Tests: No results for input(s): TSH, T4TOTAL, FREET4, T3FREE, THYROIDAB in the last 72 hours. Anemia Panel: No results for input(s): VITAMINB12, FOLATE, FERRITIN, TIBC, IRON, RETICCTPCT in the last 72 hours. Urine analysis:  Component Value Date/Time   COLORURINE YELLOW 07/19/2018 0210   APPEARANCEUR CLEAR 07/19/2018 0210   LABSPEC 1.023 07/19/2018 0210   PHURINE 6.0 07/19/2018 0210   GLUCOSEU NEGATIVE 07/19/2018 0210   HGBUR NEGATIVE 07/19/2018 0210   BILIRUBINUR NEGATIVE 07/19/2018 0210   KETONESUR NEGATIVE 07/19/2018 0210   PROTEINUR NEGATIVE 07/19/2018 0210   UROBILINOGEN 0.2 12/01/2008 0412   NITRITE NEGATIVE 07/19/2018 0210   LEUKOCYTESUR NEGATIVE 07/19/2018 0210   Sepsis Labs: @LABRCNTIP (procalcitonin:4,lacticidven:4) ) Recent Results (from the past 240 hour(s))  SARS Coronavirus 2 (CEPHEID - Performed in [place] hospital lab), Hosp Order     Status: None   Collection Time: 07/19/18  4:31 AM  Result Value Ref Range Status   SARS Coronavirus 2 NEGATIVE NEGATIVE Final    Comment: (NOTE) If result is NEGATIVE SARS-CoV-2 target nucleic acids are NOT DETECTED. The SARS-CoV-2 RNA is generally detectable in upper and lower  respiratory specimens during the acute phase of infection. The lowest  concentration of  SARS-CoV-2 viral copies this assay can detect is 250  copies / mL. A negative result does not preclude SARS-CoV-2 infection  and should not be used as the sole basis for treatment or other  patient management decisions.  A negative result may occur with  improper specimen collection / handling, submission of specimen other  than nasopharyngeal swab, presence of viral mutation(s) within the  areas targeted by this assay, and inadequate number of viral copies  (<250 copies / mL). A negative result must be combined with clinical  observations, patient history, and epidemiological information. If result is POSITIVE SARS-CoV-2 target nucleic acids are DETECTED. The SARS-CoV-2 RNA is generally detectable in upper and lower  respiratory specimens dur ing the acute phase of infection.  Positive  results are indicative of active infection with SARS-CoV-2.  Clinical  correlation with patient history and other diagnostic information is  necessary to determine patient infection status.  Positive results do  not rule out bacterial infection or co-infection with other viruses. If result is PRESUMPTIVE POSTIVE SARS-CoV-2 nucleic acids MAY BE PRESENT.   A presumptive positive result was obtained on the submitted specimen  and confirmed on repeat testing.  While 2019 novel coronavirus  (SARS-CoV-2) nucleic acids may be present in the submitted sample  additional confirmatory testing may be necessary for epidemiological  and / or clinical management purposes  to differentiate between  SARS-CoV-2 and other Sarbecovirus currently known to infect humans.  If clinically indicated additional testing with an alternate test  methodology [phone]) is advised. The SARS-CoV-2 RNA is generally  detectable in upper and lower respiratory sp ecimens during the acute  phase of infection. The expected result is Negative. Fact Sheet for Patients:  StrictlyIdeas.no Fact Sheet for Healthcare  Providers: BankingDealers.co.za This test is not yet approved or cleared by the [place] FDA and has been authorized for detection and/or diagnosis of SARS-CoV-2 by FDA under an Emergency Use Authorization (EUA).  This EUA will remain in effect (meaning this test can be used) for the duration of the COVID-19 declaration under Section 564(b)(1) of the Act, 21 U.S.C. section 360bbb-3(b)(1), unless the authorization is terminated or revoked sooner. Performed at [facility], [place] [address] [place], [place] [address]      Radiological Exams on Admission: Dg Chest Portable 1 View  Result Date: 07/19/2018 CLINICAL DATA:  Skin infection EXAM: PORTABLE CHEST 1 VIEW COMPARISON:  04/10/2018 FINDINGS: The heart size and mediastinal contours are within normal limits. Both lungs are  clear. The visualized skeletal structures are unremarkable. IMPRESSION: No active disease. Electronically Signed   By: Ulyses Jarred M.D.   On: 07/19/2018 05:22   Dg Foot Complete Left  Result Date: 07/19/2018 CLINICAL DATA:  Infection EXAM: LEFT FOOT - COMPLETE 3+ VIEW COMPARISON:  None. FINDINGS: There is no evidence of fracture or dislocation. There is no evidence of arthropathy or other focal bone abnormality. Soft tissues are unremarkable. IMPRESSION: Negative. Electronically Signed   By: Ulyses Jarred M.D.   On: 07/19/2018 05:21      Assessment/Plan Principal Problem:   Cellulitis Active Problems:   GAD (generalized anxiety disorder)   ADD (attention deficit disorder)   IVDU (intravenous drug user)     #1 diffuse cellulitis of the left lower extremity and upper extremities: This appears to be MRSA type infections but could be other bacteria.  Admit the patient.  Initiate IV vancomycin.  Get ID consultation.  Monitor patient's response with blood cultures.  Maybe get echocardiogram to rule out possible vegetation.  No murmurs however.  #2 history of IV drug abuse: We will check  drug screen. Patient denied use of drugs now.  #3 generalized anxiety disorder: Obtain and continue with home regimen.  #4 morbid obesity: Dietary counseling.   DVT prophylaxis: Heparin Code Status: Full code Family Communication: No family at bedside discussed care with the patient Disposition Plan: Home Consults called: Please consult infectious disease in the morning Admission status: Inpatient  Severity of Illness: The appropriate patient status for this patient is INPATIENT. Inpatient status is judged to be reasonable and necessary in order to provide the required intensity of service to ensure the patient's safety. The patient's presenting symptoms, physical exam findings, and initial radiographic and laboratory data in the context of their chronic comorbidities is felt to place them at high risk for further clinical deterioration. Furthermore, it is not anticipated that the patient will be medically stable for discharge from the hospital within 2 midnights of admission. The following factors support the patient status of inpatient.   " The patient's presenting symptoms include multiple skin eruptions. " The worrisome physical exam findings include multiple eruptions on the skin. " The initial radiographic and laboratory data are worrisome because of labs currently pending. " The chronic co-morbidities include IV drug abuse.   * I certify that at the point of admission it is my clinical judgment that the patient will require inpatient hospital care spanning beyond 2 midnights from the point of admission due to high intensity of service, high risk for further deterioration and high frequency of surveillance required.Barbette Merino MD [facility] Pager [phone]  If 7PM-7AM, please contact night-coverage [email or web address] Password [email or web address]  07/19/2018, 6:04 AM

## 2018-07-19 NOTE — ED Triage Notes (Signed)
Pt has multiple abscesses all over his arms, redness and swelling noted

## 2018-07-19 NOTE — Progress Notes (Signed)
Pharmacy Antibiotic Note  [name] is a 48 y.o. male admitted on 07/19/2018 with cellulitis.  Pharmacy has been consulted for Vancomycin  dosing.  Plan: Vancomycin 2 g IV now, then Vancomycin 1000 mg IV q12h     Temp (24hrs), Avg:98.5 F (36.9 C), Min:98.5 F (36.9 C), Max:98.5 F (36.9 C)  Recent Labs  Lab 07/19/18 0209  WBC 7.6  CREATININE 1.51*  LATICACIDVEN 1.1    CrCl cannot be calculated (Unknown ideal weight.).    Allergies  Allergen Reactions  . Codeine Itching and Nausea Only  . Tylenol [Acetaminophen] Other (See Comments)    headache     [name], [name] 07/19/2018 6:05 AM

## 2018-07-19 NOTE — Consult Note (Signed)
[facility]       Reason for Consult: bilateral arm cellulitis    Referring Physician: Hosie Poisson, MD  Principal Problem:   Cellulitis Active Problems:   GAD (generalized anxiety disorder)   ADD (attention deficit disorder)   IVDU (intravenous drug user)    heparin  5,000 Units Subcutaneous Q8H    Recommendations: 1. Possible cellulitis -it is unclear at present if the patient actually has cellulitis on any of his extremities at this time.  His clinical exam is rather underwhelming for this possibility.  His white blood cell count is normal and he is remained afebrile throughout his hospital course with negative blood cultures thus far.  His lactic acid levels have been normal as well.  If the patient is to be believed that his last injection drug use was in January, he should have no active reason for bacteremia or sepsis at this time.  Is reasonable to continue empiric vancomycin and Rocephin for now, but if the patient does blood cultures remain negative at 48 to 72 hours, I would be in favor of rapid de-escalation if not total cessation of antibiotics.  The limb swelling that the patient has appears more likely to be due to chronic vascular damage so would follow-up Doppler ultrasounds of both his upper and lower extremities to rule out DVTs or other chronic vascular calcifications.  Unfortunately, the patient's occupation as a Development worker, international aid lends itself to poor hand hygiene in a dirty environment which will raise his risk for development of recurrent infections as well.  I reviewed ad nauseum with the patient preferred hand and nail hygiene to lower this risk moving forward.  At this time I see no indication for chronic antibiotics as this is never been found to successfully lower patient's risks of infection/cellulitis, particularly with suspected staph. The patient admits that he is never been formally diagnosed with MRSA as no cultures have ever been  obtained to his wounds to his memory.  Consider checking MRSA nasal swab to assess for even colonization.  He was instructed to abstain from further IV drug use as this would be the most likely route of repetitive infection.  2. IVDU -patient admits he is used heroin regularly since his teenage years with the exception of one 15-year span where he remained sober.  He also admits to intranasal cocaine use and occasional injection of this drug as well.  Per the patient, he last used IV heroin in January of this year.  Shortly after this time is when he actually had received antibiotics for his left foot.  We will check an HIV antibody as well as hepatitis C antibody with reflex to viral load if positive.  Again have instructed the patient to abstain from further IV drug use moving forward.  He has been reluctant to provide urine for drug screening thus far.  Assessment: The patient is a 48 y/o WM with known IVDU and LT foot furunculosis earlier this year admitted with multiple wounds and swelling to both arms concerning for cellulitis.  Antibiotics: Vancomycin, day 2 Rocephin, day 1  HPI: [name] is a 48 y.o. white male with known IV drug use and history of left foot abscess presenting with several month history of upper extremity swelling and wounds to both arms, concerning for cellulitis.  Patient states that he has a left foot abscess in January of this year following an attempt to inject IV heroin to his foot.  He  denies any cultures being obtained but did have the abscess debrided and was placed on oral Keflex for several weeks.  While the patient does admit to injecting drugs daily in the past, he is adamant that he is not injected drugs further since January.  However, he presents with a several week to months history of bilateral arm swelling as well as right leg swelling.  No objective erythema is present on any of his extremities at this time and his white blood cell count is normal  as is his lactic acid level throughout his admission thus far.  He was empirically started on vancomycin and Rocephin was added today for concerns that his swelling was due to cellulitis.  The patient has refused to provide a urine specimen for drug screening.  Ultrasounds of his extremities have been ordered but not performed thus far.  The patient denies any systemic fevers or chills.  He also denies any recent trauma to his arms.  He has multiple bandages that are present to both of his arms that he placed prior to admission.  He has no active draining wounds from any of the sites at this time.  A transthoracic echocardiogram was also ordered but has not yet been performed thus far.  He did have a negative COVID-19 tests performed in the ER. Fever curve, WBC trends, ABX usage, imaging all independently reviewed  Review of Systems: Patient denies any systemic fevers or chills.  He admits to bilateral arm swelling as well as right leg swelling for the last 3 weeks to 3 to 4 months (the patient cannot provide a more finite timeframe).  He had a left foot wound that was debrided in January 2020.  He denies any shortness of breath, cough, headaches, nausea, or vomiting prior to admission. All other systems reviewed and are negative    Past Medical History:  Diagnosis Date   Anxiety    Chronic back pain    Depression    History of diverticulosis    History of kidney stones    Kidney stones    MRSA (methicillin resistant staph aureus) culture positive    Substance abuse ([place])    former abuser methodone and oxycodone    Social History   Tobacco Use   Smoking status: Light Tobacco Smoker    Packs/day: 0.00    Years: 26.00    Pack years: 0.00    Last attempt to quit: 04/05/2015    Years since quitting: 3.2   Smokeless tobacco: Never Used  Substance Use Topics   Alcohol use: Yes    Alcohol/week: 0.0 standard drinks    Comment: 3 times per week   Drug use: Yes    Comment: heroin  daily  Pt runs a landscaping business locally. Admits to daily heroin use in the past but states he last used in January 2020. He admits to intranasal and IV cocaine in the past as well.  Family History  Problem Relation Age of Onset   Hypertension Mother    Cancer Maternal Grandmother        ovarian   Diabetes Paternal Grandfather    Coronary artery disease Father     Allergies  Allergen Reactions   Codeine Itching and Nausea Only   Tylenol [Acetaminophen] Other (See Comments)    headache    Physical Exam:  Vitals:   07/19/18 0430 07/19/18 0606  BP: 136/84 (!) 156/79  Pulse: 83 76  Resp: 16 19  Temp:  98.4 F (36.9 C)  SpO2: 98% 100%  Physical Exam Gen: stout, NAD, A&Ox 3 Head: NCAT, no temporal wasting evident EENT: PERRL, EOMI, MMM, adequate dentition Neck: supple, no JVD CV: NRRR, no murmurs evident Pulm: CTA bilaterally, no wheeze or retractions Abd: soft, NTND, +BS Extrems: 2+ pitting RT leg edema, 2+ pitting bilateral UE edema, 2+ pulses Skin: no erythema or purulent drainage surrounding any wounds, multiple track marks with open wounds to both forearms, poor skin turgor, multiple tattoos to RT arm and upper LT back, poor fingernail hygiene with visible dirt seen under all nailbeds Neuro: CN [name]-XII grossly intact, no focal neurologic deficits appreciated, gait was not assessed, A&Ox 3  Lab Results  Component Value Date   WBC 6.7 07/19/2018   HGB 9.3 (L) 07/19/2018   HCT 28.9 (L) 07/19/2018   MCV 85.3 07/19/2018   PLT 327 07/19/2018    Lab Results  Component Value Date   CREATININE 1.14 07/19/2018   BUN 21 (H) 07/19/2018   NA 139 07/19/2018   K 5.0 07/19/2018   CL 102 07/19/2018   CO2 23 07/19/2018    Lab Results  Component Value Date   ALT 17 07/19/2018   AST 20 07/19/2018   ALKPHOS 78 07/19/2018     Microbiology: Recent Results (from the past 240 hour(s))  SARS Coronavirus 2 (CEPHEID - Performed in [facility] hospital lab), Hosp Order      Status: None   Collection Time: 07/19/18  4:31 AM  Result Value Ref Range Status   SARS Coronavirus 2 NEGATIVE NEGATIVE Final    Comment: (NOTE) If result is NEGATIVE SARS-CoV-2 target nucleic acids are NOT DETECTED. The SARS-CoV-2 RNA is generally detectable in upper and lower  respiratory specimens during the acute phase of infection. The lowest  concentration of SARS-CoV-2 viral copies this assay can detect is 250  copies / mL. A negative result does not preclude SARS-CoV-2 infection  and should not be used as the sole basis for treatment or other  patient management decisions.  A negative result may occur with  improper specimen collection / handling, submission of specimen other  than nasopharyngeal swab, presence of viral mutation(s) within the  areas targeted by this assay, and inadequate number of viral copies  (<250 copies / mL). A negative result must be combined with clinical  observations, patient history, and epidemiological information. If result is POSITIVE SARS-CoV-2 target nucleic acids are DETECTED. The SARS-CoV-2 RNA is generally detectable in upper and lower  respiratory specimens dur ing the acute phase of infection.  Positive  results are indicative of active infection with SARS-CoV-2.  Clinical  correlation with patient history and other diagnostic information is  necessary to determine patient infection status.  Positive results do  not rule out bacterial infection or co-infection with other viruses. If result is PRESUMPTIVE POSTIVE SARS-CoV-2 nucleic acids MAY BE PRESENT.   A presumptive positive result was obtained on the submitted specimen  and confirmed on repeat testing.  While 2019 novel coronavirus  (SARS-CoV-2) nucleic acids may be present in the submitted sample  additional confirmatory testing may be necessary for epidemiological  and / or clinical management purposes  to differentiate between  SARS-CoV-2 and other Sarbecovirus currently known to  infect humans.  If clinically indicated additional testing with an alternate test  methodology [phone]) is advised. The SARS-CoV-2 RNA is generally  detectable in upper and lower respiratory sp ecimens during the acute  phase of infection. The expected result is Negative. Fact Sheet for Patients:  StrictlyIdeas.no Fact Sheet for Healthcare Providers: BankingDealers.co.za This test is not yet approved or cleared by the [place] FDA and has been authorized for detection and/or diagnosis of SARS-CoV-2 by FDA under an Emergency Use Authorization (EUA).  This EUA will remain in effect (meaning this test can be used) for the duration of the COVID-19 declaration under Section 564(b)(1) of the Act, 21 U.S.C. section 360bbb-3(b)(1), unless the authorization is terminated or revoked sooner. Performed at [facility], [place] [address] [place], [place] [address]     [name], [name] for Infectious Disease [facility] [email or web address] 07/19/2018, 1:27 PM

## 2018-07-19 NOTE — Progress Notes (Signed)
UE venous duplex       has been completed. Preliminary results can be found under CV proc through chart review. June Leap, BS, RDMS, RVT

## 2018-07-20 DIAGNOSIS — F1510 Other stimulant abuse, uncomplicated: Secondary | ICD-10-CM

## 2018-07-20 DIAGNOSIS — F1110 Opioid abuse, uncomplicated: Secondary | ICD-10-CM

## 2018-07-20 DIAGNOSIS — L989 Disorder of the skin and subcutaneous tissue, unspecified: Secondary | ICD-10-CM

## 2018-07-20 LAB — MRSA PCR SCREENING: MRSA by PCR: NEGATIVE

## 2018-07-20 LAB — T3, FREE: T3, Free: 3.2 pg/mL (ref 2.0–4.4)

## 2018-07-20 MED ORDER — AMPHETAMINE-DEXTROAMPHETAMINE 10 MG PO TABS
20.0000 mg | ORAL_TABLET | Freq: Four times a day (QID) | ORAL | Status: DC
  Administered 2018-07-21: 20 mg via ORAL
  Filled 2018-07-20 (×2): qty 2

## 2018-07-20 MED ORDER — ALPRAZOLAM 0.5 MG PO TABS
1.0000 mg | ORAL_TABLET | Freq: Three times a day (TID) | ORAL | Status: DC | PRN
  Administered 2018-07-20: 1 mg via ORAL
  Filled 2018-07-20: qty 2

## 2018-07-20 MED ORDER — CITALOPRAM HYDROBROMIDE 40 MG PO TABS
40.0000 mg | ORAL_TABLET | Freq: Every day | ORAL | Status: DC
  Administered 2018-07-20 – 2018-07-21 (×2): 40 mg via ORAL
  Filled 2018-07-20 (×2): qty 1

## 2018-07-20 NOTE — Progress Notes (Signed)
Lab unable to find good veins from bilateral arms to draw blood. Notified MD. Advised lab to try again today.

## 2018-07-20 NOTE — Progress Notes (Signed)
[facility] for Infectious Disease   Reason for visit: Follow up on skin lesions  Interval History: remains afebrile, normal WBC, no new complaints.  Less swelling of upper extremities.    Physical Exam: Constitutional:  Vitals:   07/19/18 1427 07/19/18 2022  BP: (!) 160/81 138/87  Pulse: 66 72  Resp: 18 19  SpO2: 100% 100%   patient appears in NAD Eyes: anicteric HENT: no thrush Respiratory: Normal respiratory effort; CTA B Cardiovascular: RRR MS: multiple lesions on both upper extremities with some pus in some of the lesions, no surrounding erythema or warmth  Review of Systems: Constitutional: negative for fevers, chills; positive for fatigue Gastrointestinal: negative for nausea and diarrhea Integument/breast: negative for rash  Lab Results  Component Value Date   WBC 6.7 07/19/2018   HGB 9.3 (L) 07/19/2018   HCT 28.9 (L) 07/19/2018   MCV 85.3 07/19/2018   PLT 327 07/19/2018    Lab Results  Component Value Date   CREATININE 1.14 07/19/2018   BUN 21 (H) 07/19/2018   NA 139 07/19/2018   K 5.0 07/19/2018   CL 102 07/19/2018   CO2 23 07/19/2018    Lab Results  Component Value Date   ALT 17 07/19/2018   AST 20 07/19/2018   ALKPHOS 78 07/19/2018     Microbiology: Recent Results (from the past 240 hour(s))  SARS Coronavirus 2 (CEPHEID - Performed in [facility] hospital lab), Hosp Order     Status: None   Collection Time: 07/19/18  4:31 AM  Result Value Ref Range Status   SARS Coronavirus 2 NEGATIVE NEGATIVE Final    Comment: (NOTE) If result is NEGATIVE SARS-CoV-2 target nucleic acids are NOT DETECTED. The SARS-CoV-2 RNA is generally detectable in upper and lower  respiratory specimens during the acute phase of infection. The lowest  concentration of SARS-CoV-2 viral copies this assay can detect is 250  copies / mL. A negative result does not preclude SARS-CoV-2 infection  and should not be used as the sole basis for treatment or other  patient  management decisions.  A negative result may occur with  improper specimen collection / handling, submission of specimen other  than nasopharyngeal swab, presence of viral mutation(s) within the  areas targeted by this assay, and inadequate number of viral copies  (<250 copies / mL). A negative result must be combined with clinical  observations, patient history, and epidemiological information. If result is POSITIVE SARS-CoV-2 target nucleic acids are DETECTED. The SARS-CoV-2 RNA is generally detectable in upper and lower  respiratory specimens dur ing the acute phase of infection.  Positive  results are indicative of active infection with SARS-CoV-2.  Clinical  correlation with patient history and other diagnostic information is  necessary to determine patient infection status.  Positive results do  not rule out bacterial infection or co-infection with other viruses. If result is PRESUMPTIVE POSTIVE SARS-CoV-2 nucleic acids MAY BE PRESENT.   A presumptive positive result was obtained on the submitted specimen  and confirmed on repeat testing.  While 2019 novel coronavirus  (SARS-CoV-2) nucleic acids may be present in the submitted sample  additional confirmatory testing may be necessary for epidemiological  and / or clinical management purposes  to differentiate between  SARS-CoV-2 and other Sarbecovirus currently known to infect humans.  If clinically indicated additional testing with an alternate test  methodology [phone]) is advised. The SARS-CoV-2 RNA is generally  detectable in upper and lower respiratory sp ecimens during the acute  phase  of infection. The expected result is Negative. Fact Sheet for Patients:  StrictlyIdeas.no Fact Sheet for Healthcare Providers: BankingDealers.co.za This test is not yet approved or cleared by the [place] FDA and has been authorized for detection and/or diagnosis of SARS-CoV-2 by FDA under  an Emergency Use Authorization (EUA).  This EUA will remain in effect (meaning this test can be used) for the duration of the COVID-19 declaration under Section 564(b)(1) of the Act, 21 U.S.C. section 360bbb-3(b)(1), unless the authorization is terminated or revoked sooner. Performed at [facility], [place] [address], [place], [place] [address]   Blood culture (routine x 2)     Status: None (Preliminary result)   Collection Time: 07/19/18  4:32 AM  Result Value Ref Range Status   Specimen Description BLOOD LEFT ARM  Final   Special Requests   Final    BOTTLES DRAWN AEROBIC AND ANAEROBIC Blood Culture adequate volume   Culture   Final    NO GROWTH 1 DAY Performed at [facility], [place] [address] [place], [place] [address]    Report Status PENDING  Incomplete  Blood culture (routine x 2)     Status: None (Preliminary result)   Collection Time: 07/19/18  4:32 AM  Result Value Ref Range Status   Specimen Description BLOOD RIGHT ARM  Final   Special Requests   Final    BOTTLES DRAWN AEROBIC ONLY Blood Culture results may not be optimal due to an excessive volume of blood received in culture bottles   Culture   Final    NO GROWTH 1 DAY Performed at [facility], [place] [address] [place], [place] [address]    Report Status PENDING  Incomplete    Impression/Plan:  1. Skin lesions - some pus noted and on vancomycin.  I would continue with that and can use doxycycline at discharge.   I will stop cefttriaxone He will need wound care ongoing  2.  Substance abuse - UDS positive for opioids and amphetamines (?Adderall).    3.  Medication monitoring - creat improved yesterday.  Rechecking today  4.  Screening - rechecking HIV and HCV

## 2018-07-20 NOTE — Consult Note (Signed)
[facility] Nurse wound consult note Reason for Consult:Bilateral arm lesions. Reportedly cellulitis, although no erythema or purulence noted.  History IV drug use but last injection was 1/202, he reports.  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 He is sleeping and minimally participative.   Wound type:infectious Pressure Injury POA: NA Measurement: Scattered nonintact 0.5 cm scabbed lesions.  No purulence or erythema noted.  Tender to touch and bilateral arms are edematous  Wound UDJ:[id number] Drainage (amount, consistency, odor) none noted Periwound:Edema and cracked skin Dressing procedure/placement/frequency:Cleanse arms with NS after Removing old dressings to arms. Apply Xeroform to open wounds.  Secure with kerlix and tape.   Will not follow at this time.  Please re-consult if needed.  Domenic Moras MSN, RN, FNP-BC CWON Wound, Ostomy, Continence Nurse Pager [phone]

## 2018-07-20 NOTE — Progress Notes (Signed)
PROGRESS NOTE    [name]  OEV:[id number] DOB: 1970/05/15 DOA: 07/19/2018 PCP: Patient, No Pcp Per    Brief Narrative:  Eustace Quail IIis a 48 y.o.malewith medical history significant ofIV drug abuse with recurrent cellulitis, generalized anxiety disorder, ADHD, chronic pain syndrome, previous history of MRSA, kidney stones who presented to the ER with diffuse multiple skin wounds on the upper extremities and swelling and tenderness of the upper extremities.   He was admitted for recurrent cellulitis of the upper extremities.  ID consulted for further recommendations.    Assessment & Plan:   Principal Problem:   Cellulitis Active Problems:   GAD (generalized anxiety disorder)   ADD (attention deficit disorder)   IVDU (intravenous drug user)  Recurrent cellulitis of both the upper extremities Started the patient on IV vancomycin and Rocephin was added on 07/19/2018. ID consulted for recommendations on antibiotics and duration. Venous duplex is negative for SVT or DVT. Blood cultures have been negative so far. Recommend to continue with IV antibiotics for another 24 hours.  Drug screen is positive for amphetamines and opiates.    Polysubstance abuse  patient reports he has been off heroin since January Social worker will be consulted for resources.    AKI Improved with hydration repeat renal parameters today.   Anemia of chronic disease Hemoglobin running between 9-10. Anemia panel will be ordered.   DVT prophylaxis: Lovenox Code Status: Full code Family Communication: None at bedside, discussed the plan with the patient Disposition Plan: Pending clinical improvement waiting for blood cultures   Consultants:   ID.   Procedures: venous duplex of the upper extremities negative for dvt OR SVT. Antimicrobials:vancomycin since admission And rocephin since 6/7  Subjective: Sleeping comfortably. No new complaints.   Objective: Vitals:   07/19/18 0606 07/19/18 1257 07/19/18 1427 07/19/18 2022  BP: (!) 156/79  (!) 160/81 138/87  Pulse: 76  66 72  Resp: 19  18 19   Temp: 98.4 F (36.9 C)  98.1 F (36.7 C) 98 F (36.7 C)  TempSrc: Oral  Axillary Axillary  SpO2: 100%  100% 100%  Weight:  95.3 kg    Height:  5\' 10"  (1.778 m)      Intake/Output Summary (Last 24 hours) at 07/20/2018 1028 Last data filed at 07/20/2018 0750 Gross per 24 hour  Intake 2938.45 ml  Output 1275 ml  Net 1663.45 ml   Filed Weights   07/19/18 1257  Weight: 95.3 kg    Examination:  General exam: Appears calm and comfortable  Respiratory system: Clear to auscultation. Respiratory effort normal. Cardiovascular system: S1 & S2 heard, RRR. No JVD,  Gastrointestinal system: Abdomen is nondistended, soft and nontender. No organomegaly or masses felt. Normal bowel sounds heard. Central nervous system: Alert and oriented. No focal neurological deficits. Extremities: bilateral upper extremity swelling and multiple open wounds to both forearms.  Skin: No rashes, lesions or ulcers Psychiatry:  Mood & affect appropriate.     Data Reviewed: I have personally reviewed following labs and imaging studies  CBC: Recent Labs  Lab 07/19/18 0209 07/19/18 0627  WBC 7.6 6.7  NEUTROABS 5.3  --   HGB 10.1* 9.3*  HCT 32.2* 28.9*  MCV 86.1 85.3  PLT 379 829   Basic Metabolic Panel: Recent Labs  Lab 07/19/18 0209 07/19/18 0627  NA 139  --   K 5.0  --   CL 102  --   CO2 23  --   GLUCOSE 129*  --  BUN 21*  --   CREATININE 1.51* 1.14  CALCIUM 8.7*  --    GFR: Estimated Creatinine Clearance: 93.8 mL/min (by C-G formula based on SCr of 1.14 mg/dL). Liver Function Tests: Recent Labs  Lab 07/19/18 0209  AST 20  ALT 17  ALKPHOS 78  BILITOT 0.3  PROT 7.1  ALBUMIN 3.2*   No results for input(s): LIPASE, AMYLASE in the last 168 hours. No results for input(s): AMMONIA in the last 168 hours. Coagulation Profile: No results for input(s): INR,  PROTIME in the last 168 hours. Cardiac Enzymes: No results for input(s): CKTOTAL, CKMB, CKMBINDEX, TROPONINI in the last 168 hours. BNP (last 3 results) No results for input(s): PROBNP in the last 8760 hours. HbA1C: No results for input(s): HGBA1C in the last 72 hours. CBG: No results for input(s): GLUCAP in the last 168 hours. Lipid Profile: No results for input(s): CHOL, HDL, LDLCALC, TRIG, CHOLHDL, LDLDIRECT in the last 72 hours. Thyroid Function Tests: Recent Labs    07/19/18 0209 07/19/18 0627 07/19/18 0927  TSH  --  4.553*  --   FREET4  --   --  0.96  T3FREE 3.2  --   --    Anemia Panel: No results for input(s): VITAMINB12, FOLATE, FERRITIN, TIBC, IRON, RETICCTPCT in the last 72 hours. Sepsis Labs: Recent Labs  Lab 07/19/18 0209 07/19/18 0627  LATICACIDVEN 1.1 0.5    Recent Results (from the past 240 hour(s))  SARS Coronavirus 2 (CEPHEID - Performed in [facility] hospital lab), Hosp Order     Status: None   Collection Time: 07/19/18  4:31 AM  Result Value Ref Range Status   SARS Coronavirus 2 NEGATIVE NEGATIVE Final    Comment: (NOTE) If result is NEGATIVE SARS-CoV-2 target nucleic acids are NOT DETECTED. The SARS-CoV-2 RNA is generally detectable in upper and lower  respiratory specimens during the acute phase of infection. The lowest  concentration of SARS-CoV-2 viral copies this assay can detect is 250  copies / mL. A negative result does not preclude SARS-CoV-2 infection  and should not be used as the sole basis for treatment or other  patient management decisions.  A negative result may occur with  improper specimen collection / handling, submission of specimen other  than nasopharyngeal swab, presence of viral mutation(s) within the  areas targeted by this assay, and inadequate number of viral copies  (<250 copies / mL). A negative result must be combined with clinical  observations, patient history, and epidemiological information. If result is  POSITIVE SARS-CoV-2 target nucleic acids are DETECTED. The SARS-CoV-2 RNA is generally detectable in upper and lower  respiratory specimens dur ing the acute phase of infection.  Positive  results are indicative of active infection with SARS-CoV-2.  Clinical  correlation with patient history and other diagnostic information is  necessary to determine patient infection status.  Positive results do  not rule out bacterial infection or co-infection with other viruses. If result is PRESUMPTIVE POSTIVE SARS-CoV-2 nucleic acids MAY BE PRESENT.   A presumptive positive result was obtained on the submitted specimen  and confirmed on repeat testing.  While 2019 novel coronavirus  (SARS-CoV-2) nucleic acids may be present in the submitted sample  additional confirmatory testing may be necessary for epidemiological  and / or clinical management purposes  to differentiate between  SARS-CoV-2 and other Sarbecovirus currently known to infect humans.  If clinically indicated additional testing with an alternate test  methodology [phone]) is advised. The SARS-CoV-2 RNA is generally  detectable in upper and lower respiratory sp ecimens during the acute  phase of infection. The expected result is Negative. Fact Sheet for Patients:  StrictlyIdeas.no Fact Sheet for Healthcare Providers: BankingDealers.co.za This test is not yet approved or cleared by the [place] FDA and has been authorized for detection and/or diagnosis of SARS-CoV-2 by FDA under an Emergency Use Authorization (EUA).  This EUA will remain in effect (meaning this test can be used) for the duration of the COVID-19 declaration under Section 564(b)(1) of the Act, 21 U.S.C. section 360bbb-3(b)(1), unless the authorization is terminated or revoked sooner. Performed at [facility], [place] [address] [place], [place] [address]          Radiology Studies: Dg Chest Portable 1  View  Result Date: 07/19/2018 CLINICAL DATA:  Skin infection EXAM: PORTABLE CHEST 1 VIEW COMPARISON:  04/10/2018 FINDINGS: The heart size and mediastinal contours are within normal limits. Both lungs are clear. The visualized skeletal structures are unremarkable. IMPRESSION: No active disease. Electronically Signed   By: Ulyses Jarred M.D.   On: 07/19/2018 05:22   Dg Foot Complete Left  Result Date: 07/19/2018 CLINICAL DATA:  Infection EXAM: LEFT FOOT - COMPLETE 3+ VIEW COMPARISON:  None. FINDINGS: There is no evidence of fracture or dislocation. There is no evidence of arthropathy or other focal bone abnormality. Soft tissues are unremarkable. IMPRESSION: Negative. Electronically Signed   By: Ulyses Jarred M.D.   On: 07/19/2018 05:21   Vas [place] Upper Extremity Venous Duplex  Result Date: 07/19/2018 UPPER VENOUS STUDY  Indications: IV drug user/ multiple skin lesions and edema Limitations: Patient position and edema. Performing Technologist: June Leap RDMS, RVT  Examination Guidelines: A complete evaluation includes B-mode imaging, spectral Doppler, color Doppler, and power Doppler as needed of all accessible portions of each vessel. Bilateral testing is considered an integral part of a complete examination. Limited examinations for reoccurring indications may be performed as noted.  Right Findings: +----------+------------+---------+-----------+----------+--------------+  RIGHT      Compressible Phasicity Spontaneous Properties    Summary      +----------+------------+---------+-----------+----------+--------------+  IJV            Full        Yes        Yes                                +----------+------------+---------+-----------+----------+--------------+  Subclavian     Full        Yes        Yes                                +----------+------------+---------+-----------+----------+--------------+  Axillary       Full        Yes        Yes                                 +----------+------------+---------+-----------+----------+--------------+  Brachial       Full        Yes        Yes                                +----------+------------+---------+-----------+----------+--------------+  Radial  Full                                                      +----------+------------+---------+-----------+----------+--------------+  Ulnar                                                    Not visualized  +----------+------------+---------+-----------+----------+--------------+  Cephalic       Full                                                      +----------+------------+---------+-----------+----------+--------------+  Basilic                                                  Not visualized  +----------+------------+---------+-----------+----------+--------------+  Left Findings: +----------+------------+---------+-----------+----------+--------------+  LEFT       Compressible Phasicity Spontaneous Properties    Summary      +----------+------------+---------+-----------+----------+--------------+  IJV            Full        Yes        Yes                                +----------+------------+---------+-----------+----------+--------------+  Subclavian     Full        Yes        Yes                                +----------+------------+---------+-----------+----------+--------------+  Axillary       Full        Yes        Yes                                +----------+------------+---------+-----------+----------+--------------+  Brachial       Full        Yes        Yes                                +----------+------------+---------+-----------+----------+--------------+  Radial         Full                                                      +----------+------------+---------+-----------+----------+--------------+  Ulnar  Not visualized  +----------+------------+---------+-----------+----------+--------------+  Cephalic        Full                                                      +----------+------------+---------+-----------+----------+--------------+  Basilic        Full                                                      +----------+------------+---------+-----------+----------+--------------+  Summary: No evidence of deep vein or superficial vein thrombosis involving the right and left upper extremities.  *See table(s) above for measurements and observations.  Diagnosing physician: Servando Snare MD Electronically signed by Servando Snare MD on 07/19/2018 at 5:37:29 PM.    Final         Scheduled Meds:  heparin  5,000 Units Subcutaneous Q8H   Continuous Infusions:  sodium chloride 100 mL/hr at 07/20/18 0400   cefTRIAXone (ROCEPHIN)  IV     lactated ringers     vancomycin 1,000 mg (07/20/18 0555)     LOS: 1 day    Time spent: 32 minutes   Hosie Poisson, MD [facility] Pager [phone] If 7PM-7AM, please contact night-coverage [email or web address] Password [facility] 07/20/2018, 10:28 AM

## 2018-07-21 DIAGNOSIS — L089 Local infection of the skin and subcutaneous tissue, unspecified: Secondary | ICD-10-CM

## 2018-07-21 DIAGNOSIS — X58XXXA Exposure to other specified factors, initial encounter: Secondary | ICD-10-CM

## 2018-07-21 DIAGNOSIS — S41102A Unspecified open wound of left upper arm, initial encounter: Secondary | ICD-10-CM

## 2018-07-21 DIAGNOSIS — S41101A Unspecified open wound of right upper arm, initial encounter: Secondary | ICD-10-CM

## 2018-07-21 DIAGNOSIS — F1910 Other psychoactive substance abuse, uncomplicated: Secondary | ICD-10-CM

## 2018-07-21 MED ORDER — DOXYCYCLINE HYCLATE 100 MG PO CAPS: 100.0000 mg | ORAL_CAPSULE | Freq: Two times a day (BID) | ORAL | 0 refills | Status: AC

## 2018-07-21 MED FILL — DOXYCYCLINE HYCLATE 100 MG: 100 | 5 days supply | Qty: 10 | Fill #0

## 2018-07-21 NOTE — Progress Notes (Signed)
[facility]   Reason for visit: Follow up on wound infection  Interval History: improved wounds, no fever, normal WBC.  Ready for d/c.  No associated rash or diarrhea  Physical Exam: Constitutional:  Vitals:   07/20/18 1240  Pulse: 76  Resp: (!) 21  Temp: 98.5 F (36.9 C)  SpO2: 100%   patient appears in NAD Respiratory: Normal respiratory effort; CTA B Cardiovascular: RRR GI: soft, nt, nd MS: arms wrapped  Review of Systems: Constitutional: negative for fevers, chills and anorexia Gastrointestinal: negative for nausea and diarrhea Integument/breast: negative for rash  Lab Results  Component Value Date   WBC 6.7 07/19/2018   HGB 9.3 (L) 07/19/2018   HCT 28.9 (L) 07/19/2018   MCV 85.3 07/19/2018   PLT 327 07/19/2018    Lab Results  Component Value Date   CREATININE 1.14 07/19/2018   BUN 21 (H) 07/19/2018   NA 139 07/19/2018   K 5.0 07/19/2018   CL 102 07/19/2018   CO2 23 07/19/2018    Lab Results  Component Value Date   ALT 17 07/19/2018   AST 20 07/19/2018   ALKPHOS 78 07/19/2018     Microbiology: Recent Results (from the past 240 hour(s))  SARS Coronavirus 2 (CEPHEID - Performed in [place] hospital lab), Hosp Order     Status: None   Collection Time: 07/19/18  4:31 AM  Result Value Ref Range Status   SARS Coronavirus 2 NEGATIVE NEGATIVE Final    Comment: (NOTE) If result is NEGATIVE SARS-CoV-2 target nucleic acids are NOT DETECTED. The SARS-CoV-2 RNA is generally detectable in upper and lower  respiratory specimens during the acute phase of infection. The lowest  concentration of SARS-CoV-2 viral copies this assay can detect is 250  copies / mL. A negative result does not preclude SARS-CoV-2 infection  and should not be used as the sole basis for treatment or other  patient management decisions.  A negative result may occur with  improper specimen collection / handling, submission of specimen other  than  nasopharyngeal swab, presence of viral mutation(s) within the  areas targeted by this assay, and inadequate number of viral copies  (<250 copies / mL). A negative result must be combined with clinical  observations, patient history, and epidemiological information. If result is POSITIVE SARS-CoV-2 target nucleic acids are DETECTED. The SARS-CoV-2 RNA is generally detectable in upper and lower  respiratory specimens dur ing the acute phase of infection.  Positive  results are indicative of active infection with SARS-CoV-2.  Clinical  correlation with patient history and other diagnostic information is  necessary to determine patient infection status.  Positive results do  not rule out bacterial infection or co-infection with other viruses. If result is PRESUMPTIVE POSTIVE SARS-CoV-2 nucleic acids MAY BE PRESENT.   A presumptive positive result was obtained on the submitted specimen  and confirmed on repeat testing.  While 2019 novel coronavirus  (SARS-CoV-2) nucleic acids may be present in the submitted sample  additional confirmatory testing may be necessary for epidemiological  and / or clinical management purposes  to differentiate between  SARS-CoV-2 and other Sarbecovirus currently known to infect humans.  If clinically indicated additional testing with an alternate test  methodology [phone]) is advised. The SARS-CoV-2 RNA is generally  detectable in upper and lower respiratory sp ecimens during the acute  phase of infection. The expected result is Negative. Fact Sheet for Patients:  StrictlyIdeas.no Fact Sheet for Healthcare Providers: BankingDealers.co.za This test  is not yet approved or cleared by the [place] and has been authorized for detection and/or diagnosis of SARS-CoV-2 by FDA under an Emergency Use Authorization (EUA).  This EUA will remain in effect (meaning this test can be used) for the duration of the  COVID-19 declaration under Section 564(b)(1) of the Act, 21 U.S.C. section 360bbb-3(b)(1), unless the authorization is terminated or revoked sooner. Performed at [facility], [place] [address] [place], [place] [address]   Blood culture (routine x 2)     Status: None (Preliminary result)   Collection Time: 07/19/18  4:32 AM  Result Value Ref Range Status   Specimen Description BLOOD LEFT ARM  Final   Special Requests   Final    BOTTLES DRAWN AEROBIC AND ANAEROBIC Blood Culture adequate volume   Culture   Final    NO GROWTH 1 DAY Performed at [facility], [place] [address] [place], [place] [address]    Report Status PENDING  Incomplete  Blood culture (routine x 2)     Status: None (Preliminary result)   Collection Time: 07/19/18  4:32 AM  Result Value Ref Range Status   Specimen Description BLOOD RIGHT ARM  Final   Special Requests   Final    BOTTLES DRAWN AEROBIC ONLY Blood Culture results may not be optimal due to an excessive volume of blood received in culture bottles   Culture   Final    NO GROWTH 1 DAY Performed at [facility], [place] [address], [place], [place] [address]    Report Status PENDING  Incomplete  MRSA PCR Screening     Status: None   Collection Time: 07/20/18  1:59 PM  Result Value Ref Range Status   MRSA by PCR NEGATIVE NEGATIVE Final    Comment:        The GeneXpert MRSA Assay (FDA approved for NASAL specimens only), is one component of a comprehensive MRSA colonization surveillance program. It is not intended to diagnose MRSA infection nor to guide or monitor treatment for MRSA infections. Performed at [facility], [place] [address], [place], [place] [address]     Impression/Plan:  1. Arms wounds - he does endorse picking.  I did note some purulence yesterday so will have him take about 4 days of doxycycline 100 mg twice a day.  Will provide this for him via [place] prior to discharge.  2. Substance abuse - needs continued  efforts at avoiding drugs

## 2018-07-21 NOTE — Progress Notes (Signed)
Discharged home today.Personal belongings,instructions,medications given.Verbalized understanding of instructions

## 2018-07-21 NOTE — Discharge Summary (Signed)
Physician Discharge Summary  WARD BOISSONNEAULT [name] DOB: 1971-02-05 DOA: 07/19/2018  PCP: Patient, No Pcp Per  Admit date: 07/19/2018 Discharge date: 07/21/2018  Admitted From: Home Disposition: HOme.  Recommendations for Outpatient Follow-up:  1. Follow up with PCP in 1-2 weeks 2. Please obtain BMP/CBC in one week  Discharge Condition:stable CODE STATUS: full code.  Diet recommendation: Heart Healthy   Brief/Interim Summary: Manuel KOONTZ IIis a 48 y.o.malewith medical history significant ofIV drug abuse with recurrent cellulitis, generalized anxiety disorder, ADHD, chronic pain syndrome, previous history of MRSA, kidney stones who presented to the ER with diffusemultiple skin wounds on the upper extremities and swelling and tenderness of the upper extremities.   He was admitted for recurrent cellulitis of the upper extremities.  ID consulted for further recommendations.  Discharge Diagnoses:  Principal Problem:   Cellulitis Active Problems:   GAD (generalized anxiety disorder)   ADD (attention deficit disorder)   IVDU (intravenous drug user)  Recurrent cellulitis of both the upper extremities Started the patient on IV vancomycin and Rocephin was added on 07/19/2018. ID consulted for recommendations on antibiotics and duration. Venous duplex is negative for SVT or DVT. Blood cultures have been negative so far. 4 to 5 days of doxycycline on discharge  Drug screen is positive for amphetamines and opiates.    Polysubstance abuse  patient reports he has been off heroin since January     AKI Improved with hydration repeat renal parameters today.   Anemia of chronic disease Hemoglobin running between 9-10.   Discharge Instructions  Discharge Instructions    Diet - low sodium heart healthy   Complete by:  As directed    Discharge instructions   Complete by:  As directed    Please follow up with primary care physician in one week.      Allergies as of 07/21/2018      Reactions   Codeine Itching, Nausea Only   Tylenol [acetaminophen] Other (See Comments)   headache      Medication List    STOP taking these medications   cephALEXin 500 MG capsule Commonly known as:  KEFLEX     TAKE these medications   acetaminophen 325 MG tablet Commonly known as:  TYLENOL Take 2 tablets (650 mg total) by mouth every 6 (six) hours as needed for mild pain.   alprazolam 2 MG tablet Commonly known as:  XANAX TAKE 1 TABLET BY MOUTH 3 TIMES DAILY AS NEEDED FOR ANXIETY What changed:    how much to take  how to take this  when to take this  reasons to take this   amphetamine-dextroamphetamine 20 MG tablet Commonly known as:  ADDERALL Take 20 mg by mouth 4 (four) times daily.   citalopram 40 MG tablet Commonly known as:  CELEXA Take 1 tablet (40 mg total) by mouth daily.   clobetasol ointment 0.05 % Commonly known as:  TEMOVATE Apply 1 application topically 2 (two) times daily as needed (red spots on face).   doxycycline 100 MG capsule Commonly known as:  VIBRAMYCIN Take 1 capsule (100 mg total) by mouth 2 (two) times daily for 5 days.   sildenafil 20 MG tablet Commonly known as:  REVATIO TAKE 1/2-2 tabs ON EMPTY stomach 1 hour BEFORE sex. USE lowest possible DOSE. No alcohol OR benzodiazepines What changed:    how much to take  how to take this  when to take this  reasons to take this       Allergies  Allergen Reactions  . Codeine Itching and Nausea Only  . Tylenol [Acetaminophen] Other (See Comments)    headache    Consultations:  ID.    Procedures/Studies: Dg Chest Portable 1 View  Result Date: 07/19/2018 CLINICAL DATA:  Skin infection EXAM: PORTABLE CHEST 1 VIEW COMPARISON:  04/10/2018 FINDINGS: The heart size and mediastinal contours are within normal limits. Both lungs are clear. The visualized skeletal structures are unremarkable. IMPRESSION: No active disease. Electronically Signed   By:  Ulyses Jarred M.D.   On: 07/19/2018 05:22   Dg Foot Complete Left  Result Date: 07/19/2018 CLINICAL DATA:  Infection EXAM: LEFT FOOT - COMPLETE 3+ VIEW COMPARISON:  None. FINDINGS: There is no evidence of fracture or dislocation. There is no evidence of arthropathy or other focal bone abnormality. Soft tissues are unremarkable. IMPRESSION: Negative. Electronically Signed   By: Ulyses Jarred M.D.   On: 07/19/2018 05:21   Vas [place] Upper Extremity Venous Duplex  Result Date: 07/19/2018 UPPER VENOUS STUDY  Indications: IV drug user/ multiple skin lesions and edema Limitations: Patient position and edema. Performing Technologist: June Leap RDMS, RVT  Examination Guidelines: A complete evaluation includes B-mode imaging, spectral Doppler, color Doppler, and power Doppler as needed of all accessible portions of each vessel. Bilateral testing is considered an integral part of a complete examination. Limited examinations for reoccurring indications may be performed as noted.  Right Findings: +----------+------------+---------+-----------+----------+--------------+ RIGHT     CompressiblePhasicitySpontaneousProperties   Summary     +----------+------------+---------+-----------+----------+--------------+ IJV           Full       Yes       Yes                             +----------+------------+---------+-----------+----------+--------------+ Subclavian    Full       Yes       Yes                             +----------+------------+---------+-----------+----------+--------------+ Axillary      Full       Yes       Yes                             +----------+------------+---------+-----------+----------+--------------+ Brachial      Full       Yes       Yes                             +----------+------------+---------+-----------+----------+--------------+ Radial        Full                                                  +----------+------------+---------+-----------+----------+--------------+ Ulnar                                               Not visualized +----------+------------+---------+-----------+----------+--------------+ Cephalic      Full                                                 +----------+------------+---------+-----------+----------+--------------+  Basilic                                             Not visualized +----------+------------+---------+-----------+----------+--------------+  Left Findings: +----------+------------+---------+-----------+----------+--------------+ LEFT      CompressiblePhasicitySpontaneousProperties   Summary     +----------+------------+---------+-----------+----------+--------------+ IJV           Full       Yes       Yes                             +----------+------------+---------+-----------+----------+--------------+ Subclavian    Full       Yes       Yes                             +----------+------------+---------+-----------+----------+--------------+ Axillary      Full       Yes       Yes                             +----------+------------+---------+-----------+----------+--------------+ Brachial      Full       Yes       Yes                             +----------+------------+---------+-----------+----------+--------------+ Radial        Full                                                 +----------+------------+---------+-----------+----------+--------------+ Ulnar                                               Not visualized +----------+------------+---------+-----------+----------+--------------+ Cephalic      Full                                                 +----------+------------+---------+-----------+----------+--------------+ Basilic       Full                                                 +----------+------------+---------+-----------+----------+--------------+  Summary:  No evidence of deep vein or superficial vein thrombosis involving the right and left upper extremities.  *See table(s) above for measurements and observations.  Diagnosing physician: Servando Snare MD Electronically signed by Servando Snare MD on 07/19/2018 at 5:37:29 PM.    Final        Subjective: No new complaints.   Discharge Exam: Vitals:   07/20/18 1240  Pulse: 76  Resp: (!) 21  Temp: 98.5 F (36.9 C)  SpO2: 100%   Vitals:   07/19/18 1257 07/19/18 1427 07/19/18 2022 07/20/18 1240  BP:  (!) 160/81 138/87   Pulse:  66 72  76  Resp:  18 19 (!) 21  Temp:    98.5 F (36.9 C)  TempSrc:  Axillary Axillary Oral  SpO2:  100% 100% 100%  Weight: 95.3 kg     Height: 5\' 10"  (1.778 m)       General: Pt is alert, awake, not in acute distress Cardiovascular: RRR, S1/S2 +, no rubs, no gallops Respiratory: CTA bilaterally, no wheezing, no rhonchi Abdominal: Soft, NT, ND, bowel sounds + Extremities: 3+ arm edema with multiple open wounds     The results of significant diagnostics from this hospitalization (including imaging, microbiology, ancillary and laboratory) are listed below for reference.     Microbiology: Recent Results (from the past 240 hour(s))  SARS Coronavirus 2 (CEPHEID - Performed in [facility] hospital lab), Hosp Order     Status: None   Collection Time: 07/19/18  4:31 AM  Result Value Ref Range Status   SARS Coronavirus 2 NEGATIVE NEGATIVE Final    Comment: (NOTE) If result is NEGATIVE SARS-CoV-2 target nucleic acids are NOT DETECTED. The SARS-CoV-2 RNA is generally detectable in upper and lower  respiratory specimens during the acute phase of infection. The lowest  concentration of SARS-CoV-2 viral copies this assay can detect is 250  copies / mL. A negative result does not preclude SARS-CoV-2 infection  and should not be used as the sole basis for treatment or other  patient management decisions.  A negative result may occur with  improper specimen collection  / handling, submission of specimen other  than nasopharyngeal swab, presence of viral mutation(s) within the  areas targeted by this assay, and inadequate number of viral copies  (<250 copies / mL). A negative result must be combined with clinical  observations, patient history, and epidemiological information. If result is POSITIVE SARS-CoV-2 target nucleic acids are DETECTED. The SARS-CoV-2 RNA is generally detectable in upper and lower  respiratory specimens dur ing the acute phase of infection.  Positive  results are indicative of active infection with SARS-CoV-2.  Clinical  correlation with patient history and other diagnostic information is  necessary to determine patient infection status.  Positive results do  not rule out bacterial infection or co-infection with other viruses. If result is PRESUMPTIVE POSTIVE SARS-CoV-2 nucleic acids MAY BE PRESENT.   A presumptive positive result was obtained on the submitted specimen  and confirmed on repeat testing.  While 2019 novel coronavirus  (SARS-CoV-2) nucleic acids may be present in the submitted sample  additional confirmatory testing may be necessary for epidemiological  and / or clinical management purposes  to differentiate between  SARS-CoV-2 and other Sarbecovirus currently known to infect humans.  If clinically indicated additional testing with an alternate test  methodology [phone]) is advised. The SARS-CoV-2 RNA is generally  detectable in upper and lower respiratory sp ecimens during the acute  phase of infection. The expected result is Negative. Fact Sheet for Patients:  StrictlyIdeas.no Fact Sheet for Healthcare Providers: BankingDealers.co.za This test is not yet approved or cleared by the [place] FDA and has been authorized for detection and/or diagnosis of SARS-CoV-2 by FDA under an Emergency Use Authorization (EUA).  This EUA will remain in effect (meaning this  test can be used) for the duration of the COVID-19 declaration under Section 564(b)(1) of the Act, 21 U.S.C. section 360bbb-3(b)(1), unless the authorization is terminated or revoked sooner. Performed at [facility], [place] [address], [place], [place] [address]   Blood culture (routine x 2)  Status: None (Preliminary result)   Collection Time: 07/19/18  4:32 AM  Result Value Ref Range Status   Specimen Description BLOOD LEFT ARM  Final   Special Requests   Final    BOTTLES DRAWN AEROBIC AND ANAEROBIC Blood Culture adequate volume   Culture   Final    NO GROWTH 2 DAYS Performed at [facility], [address], [place], [place] [address]    Report Status PENDING  Incomplete  Blood culture (routine x 2)     Status: None (Preliminary result)   Collection Time: 07/19/18  4:32 AM  Result Value Ref Range Status   Specimen Description BLOOD RIGHT ARM  Final   Special Requests   Final    BOTTLES DRAWN AEROBIC ONLY Blood Culture results may not be optimal due to an excessive volume of blood received in culture bottles   Culture   Final    NO GROWTH 2 DAYS Performed at [facility], [place] [address] [place], [place] [address]    Report Status PENDING  Incomplete  MRSA PCR Screening     Status: None   Collection Time: 07/20/18  1:59 PM  Result Value Ref Range Status   MRSA by PCR NEGATIVE NEGATIVE Final    Comment:        The GeneXpert MRSA Assay (FDA approved for NASAL specimens only), is one component of a comprehensive MRSA colonization surveillance program. It is not intended to diagnose MRSA infection nor to guide or monitor treatment for MRSA infections. Performed at [facility], [place] [address], [place], [place] [address]      Labs: BNP (last 3 results) No results for input(s): BNP in the last 8760 hours. Basic Metabolic Panel: Recent Labs  Lab 07/19/18 0209 07/19/18 0627  NA 139  --   K 5.0  --   CL 102  --   CO2 23  --   GLUCOSE 129*   --   BUN 21*  --   CREATININE 1.51* 1.14  CALCIUM 8.7*  --    Liver Function Tests: Recent Labs  Lab 07/19/18 0209  AST 20  ALT 17  ALKPHOS 78  BILITOT 0.3  PROT 7.1  ALBUMIN 3.2*   No results for input(s): LIPASE, AMYLASE in the last 168 hours. No results for input(s): AMMONIA in the last 168 hours. CBC: Recent Labs  Lab 07/19/18 0209 07/19/18 0627  WBC 7.6 6.7  NEUTROABS 5.3  --   HGB 10.1* 9.3*  HCT 32.2* 28.9*  MCV 86.1 85.3  PLT 379 327   Cardiac Enzymes: No results for input(s): CKTOTAL, CKMB, CKMBINDEX, TROPONINI in the last 168 hours. BNP: Invalid input(s): POCBNP CBG: No results for input(s): GLUCAP in the last 168 hours. D-Dimer No results for input(s): DDIMER in the last 72 hours. Hgb A1c No results for input(s): HGBA1C in the last 72 hours. Lipid Profile No results for input(s): CHOL, HDL, LDLCALC, TRIG, CHOLHDL, LDLDIRECT in the last 72 hours. Thyroid function studies Recent Labs    07/19/18 0209 07/19/18 0627  TSH  --  4.553*  T3FREE 3.2  --    Anemia work up No results for input(s): VITAMINB12, FOLATE, FERRITIN, TIBC, IRON, RETICCTPCT in the last 72 hours. Urinalysis    Component Value Date/Time   COLORURINE YELLOW 07/19/2018 0210   APPEARANCEUR CLEAR 07/19/2018 0210   LABSPEC 1.023 07/19/2018 0210   PHURINE 6.0 07/19/2018 0210   GLUCOSEU NEGATIVE 07/19/2018 0210   HGBUR NEGATIVE 07/19/2018 0210  BILIRUBINUR NEGATIVE 07/19/2018 0210   KETONESUR NEGATIVE 07/19/2018 0210   PROTEINUR NEGATIVE 07/19/2018 0210   UROBILINOGEN 0.2 12/01/2008 0412   NITRITE NEGATIVE 07/19/2018 0210   LEUKOCYTESUR NEGATIVE 07/19/2018 0210   Sepsis Labs Invalid input(s): PROCALCITONIN,  WBC,  LACTICIDVEN Microbiology Recent Results (from the past 240 hour(s))  SARS Coronavirus 2 (CEPHEID - Performed in [facility] hospital lab), Hosp Order     Status: None   Collection Time: 07/19/18  4:31 AM  Result Value Ref Range Status   SARS Coronavirus 2 NEGATIVE  NEGATIVE Final    Comment: (NOTE) If result is NEGATIVE SARS-CoV-2 target nucleic acids are NOT DETECTED. The SARS-CoV-2 RNA is generally detectable in upper and lower  respiratory specimens during the acute phase of infection. The lowest  concentration of SARS-CoV-2 viral copies this assay can detect is 250  copies / mL. A negative result does not preclude SARS-CoV-2 infection  and should not be used as the sole basis for treatment or other  patient management decisions.  A negative result may occur with  improper specimen collection / handling, submission of specimen other  than nasopharyngeal swab, presence of viral mutation(s) within the  areas targeted by this assay, and inadequate number of viral copies  (<250 copies / mL). A negative result must be combined with clinical  observations, patient history, and epidemiological information. If result is POSITIVE SARS-CoV-2 target nucleic acids are DETECTED. The SARS-CoV-2 RNA is generally detectable in upper and lower  respiratory specimens dur ing the acute phase of infection.  Positive  results are indicative of active infection with SARS-CoV-2.  Clinical  correlation with patient history and other diagnostic information is  necessary to determine patient infection status.  Positive results do  not rule out bacterial infection or co-infection with other viruses. If result is PRESUMPTIVE POSTIVE SARS-CoV-2 nucleic acids MAY BE PRESENT.   A presumptive positive result was obtained on the submitted specimen  and confirmed on repeat testing.  While 2019 novel coronavirus  (SARS-CoV-2) nucleic acids may be present in the submitted sample  additional confirmatory testing may be necessary for epidemiological  and / or clinical management purposes  to differentiate between  SARS-CoV-2 and other Sarbecovirus currently known to infect humans.  If clinically indicated additional testing with an alternate test  methodology [phone]) is  advised. The SARS-CoV-2 RNA is generally  detectable in upper and lower respiratory sp ecimens during the acute  phase of infection. The expected result is Negative. Fact Sheet for Patients:  StrictlyIdeas.no Fact Sheet for Healthcare Providers: BankingDealers.co.za This test is not yet approved or cleared by the [place] FDA and has been authorized for detection and/or diagnosis of SARS-CoV-2 by FDA under an Emergency Use Authorization (EUA).  This EUA will remain in effect (meaning this test can be used) for the duration of the COVID-19 declaration under Section 564(b)(1) of the Act, 21 U.S.C. section 360bbb-3(b)(1), unless the authorization is terminated or revoked sooner. Performed at [facility], [place] [address], [place], [place] [address]   Blood culture (routine x 2)     Status: None (Preliminary result)   Collection Time: 07/19/18  4:32 AM  Result Value Ref Range Status   Specimen Description BLOOD LEFT ARM  Final   Special Requests   Final    BOTTLES DRAWN AEROBIC AND ANAEROBIC Blood Culture adequate volume   Culture   Final    NO GROWTH 2 DAYS Performed at [facility], [address]  [place], [place] [address]    Report Status PENDING  Incomplete  Blood culture (routine x 2)     Status: None (Preliminary result)   Collection Time: 07/19/18  4:32 AM  Result Value Ref Range Status   Specimen Description BLOOD RIGHT ARM  Final   Special Requests   Final    BOTTLES DRAWN AEROBIC ONLY Blood Culture results may not be optimal due to an excessive volume of blood received in culture bottles   Culture   Final    NO GROWTH 2 DAYS Performed at [facility], [place] [address], [place], [place] [address]    Report Status PENDING  Incomplete  MRSA PCR Screening     Status: None   Collection Time: 07/20/18  1:59 PM  Result Value Ref Range Status   MRSA by PCR NEGATIVE NEGATIVE Final    Comment:        The  GeneXpert MRSA Assay (FDA approved for NASAL specimens only), is one component of a comprehensive MRSA colonization surveillance program. It is not intended to diagnose MRSA infection nor to guide or monitor treatment for MRSA infections. Performed at [facility], [place] [address], [place], [place] [address]      Time coordinating discharge: 32 minutes  SIGNED:   Hosie Poisson, MD  [facility] 07/21/2018, 10:27 PM Pager   If 7PM-7AM, please contact night-coverage [email or web address] Password [email or web address]

## 2018-07-24 LAB — CULTURE, BLOOD (ROUTINE X 2)
Culture: NO GROWTH
Culture: NO GROWTH
Special Requests: ADEQUATE

## 2018-09-04 ENCOUNTER — Emergency Department (HOSPITAL_COMMUNITY)
Admission: EM | Admit: 2018-09-04 | Discharge: 2018-09-05 | Discharge status: Against Medical Advice | Payer: BLUE CROSS/BLUE SHIELD | Attending: Emergency Medicine | Admitting: Emergency Medicine

## 2018-09-04 DIAGNOSIS — Z5321 Procedure and treatment not carried out due to patient leaving prior to being seen by health care provider: Secondary | ICD-10-CM | POA: Diagnosis not present

## 2018-09-04 DIAGNOSIS — L539 Erythematous condition, unspecified: Secondary | ICD-10-CM | POA: Diagnosis present

## 2018-09-05 ENCOUNTER — Encounter (HOSPITAL_COMMUNITY): Payer: Self-pay | Admitting: Emergency Medicine

## 2018-09-05 ENCOUNTER — Other Ambulatory Visit: Payer: Self-pay

## 2018-09-05 NOTE — ED Triage Notes (Signed)
Patient here with bilateral arm infections, swelling and redness.  Patient states he has been seen multiple times for the same.  Patient has been on clindamycin and states that it worked well for him.  Now that he has been off the med, his wounds have gotten worse.  Patient does have history of drug abuse.

## 2018-09-05 NOTE — ED Notes (Signed)
Pt name called 2x for vitals, still no answer

## 2019-05-05 ENCOUNTER — Other Ambulatory Visit: Payer: Self-pay

## 2019-05-05 ENCOUNTER — Emergency Department (HOSPITAL_COMMUNITY)
Admission: EM | Admit: 2019-05-05 | Discharge: 2019-05-05 | Disposition: A | Discharge status: Home / Self Care | Payer: 59 | Attending: Emergency Medicine | Admitting: Emergency Medicine

## 2019-05-05 DIAGNOSIS — R252 Cramp and spasm: Secondary | ICD-10-CM | POA: Diagnosis not present

## 2019-05-05 DIAGNOSIS — Z5321 Procedure and treatment not carried out due to patient leaving prior to being seen by health care provider: Secondary | ICD-10-CM | POA: Diagnosis not present

## 2019-05-05 DIAGNOSIS — R55 Syncope and collapse: Secondary | ICD-10-CM | POA: Diagnosis not present

## 2019-05-05 DIAGNOSIS — R42 Dizziness and giddiness: Secondary | ICD-10-CM | POA: Insufficient documentation

## 2019-05-05 MED ORDER — SODIUM CHLORIDE 0.9% FLUSH: 3.0000 mL | Freq: Once | INTRAVENOUS | Status: DC

## 2019-05-05 NOTE — ED Notes (Signed)
Patient requesting lab draw with an IV start.

## 2019-05-05 NOTE — ED Triage Notes (Signed)
Pt presents with c/o near syncope. Pt reports he has been having muscle cramps and has been drinking electrolytes to replace his potassium after doing an e-visit. Pt reports he had a syncopal episode today and is still dizzy at this time.

## 2019-05-24 ENCOUNTER — Encounter (HOSPITAL_COMMUNITY): Payer: Self-pay | Admitting: Emergency Medicine

## 2019-05-24 ENCOUNTER — Other Ambulatory Visit: Payer: Self-pay

## 2019-05-24 ENCOUNTER — Emergency Department (HOSPITAL_COMMUNITY)
Admission: EM | Admit: 2019-05-24 | Discharge: 2019-05-24 | Disposition: A | Discharge status: Home / Self Care | Payer: 59 | Attending: Emergency Medicine | Admitting: Emergency Medicine

## 2019-05-24 DIAGNOSIS — L089 Local infection of the skin and subcutaneous tissue, unspecified: Secondary | ICD-10-CM | POA: Diagnosis present

## 2019-05-24 DIAGNOSIS — Z5321 Procedure and treatment not carried out due to patient leaving prior to being seen by health care provider: Secondary | ICD-10-CM | POA: Diagnosis not present

## 2019-05-24 NOTE — ED Triage Notes (Signed)
Pt c/o bee stings on bilat legs are now infected he thinks. Been getting worse over the past couple weeks.

## 2019-08-17 ENCOUNTER — Emergency Department (HOSPITAL_COMMUNITY): Payer: 59

## 2019-08-17 ENCOUNTER — Other Ambulatory Visit: Payer: Self-pay

## 2019-08-17 ENCOUNTER — Emergency Department (HOSPITAL_COMMUNITY)
Admission: EM | Admit: 2019-08-17 | Discharge: 2019-08-18 | Disposition: A | Discharge status: Home / Self Care | Payer: 59 | Attending: Emergency Medicine | Admitting: Emergency Medicine

## 2019-08-17 ENCOUNTER — Encounter (HOSPITAL_COMMUNITY): Payer: Self-pay

## 2019-08-17 DIAGNOSIS — N390 Urinary tract infection, site not specified: Secondary | ICD-10-CM

## 2019-08-17 DIAGNOSIS — F1110 Opioid abuse, uncomplicated: Secondary | ICD-10-CM | POA: Insufficient documentation

## 2019-08-17 DIAGNOSIS — R112 Nausea with vomiting, unspecified: Secondary | ICD-10-CM

## 2019-08-17 DIAGNOSIS — F17290 Nicotine dependence, other tobacco product, uncomplicated: Secondary | ICD-10-CM | POA: Diagnosis not present

## 2019-08-17 DIAGNOSIS — R109 Unspecified abdominal pain: Secondary | ICD-10-CM | POA: Diagnosis present

## 2019-08-17 DIAGNOSIS — R110 Nausea: Secondary | ICD-10-CM

## 2019-08-17 LAB — COMPREHENSIVE METABOLIC PANEL
ALT: 26 U/L (ref 0–44)
AST: 28 U/L (ref 15–41)
Albumin: 3.9 g/dL (ref 3.5–5.0)
Alkaline Phosphatase: 79 U/L (ref 38–126)
Anion gap: 12 (ref 5–15)
BUN: 20 mg/dL (ref 6–20)
CO2: 24 mmol/L (ref 22–32)
Calcium: 8.9 mg/dL (ref 8.9–10.3)
Chloride: 102 mmol/L (ref 98–111)
Creatinine, Ser: 1.07 mg/dL (ref 0.61–1.24)
GFR calc Af Amer: >60 mL/min (ref >60)
GFR calc non Af Amer: >60 mL/min (ref >60)
Glucose, Bld: 86 mg/dL (ref 70–99)
Potassium: 4.3 mmol/L (ref 3.5–5.1)
Sodium: 138 mmol/L (ref 135–145)
Total Bilirubin: 0.5 mg/dL (ref 0.3–1.2)
Total Protein: 7.7 g/dL (ref 6.5–8.1)

## 2019-08-17 LAB — MAGNESIUM: Magnesium: 2 mg/dL (ref 1.7–2.4)

## 2019-08-17 LAB — CBC
HCT: 40.8 % (ref 39.0–52.0)
Hemoglobin: 13.5 g/dL (ref 13.0–17.0)
MCH: 28.7 pg (ref 26.0–34.0)
MCHC: 33.1 g/dL (ref 30.0–36.0)
MCV: 86.6 fL (ref 80.0–100.0)
Platelets: 243 10*3/uL (ref 150–400)
RBC: 4.71 MIL/uL (ref 4.22–5.81)
RDW: 14.1 % (ref 11.5–15.5)
WBC: 4.7 10*3/uL (ref 4.0–10.5)
nRBC: 0 % (ref 0.0–0.2)

## 2019-08-17 LAB — LIPASE, BLOOD: Lipase: 22 U/L (ref 11–51)

## 2019-08-17 LAB — CK: Total CK: 334 U/L (ref 49–397)

## 2019-08-17 LAB — BRAIN NATRIURETIC PEPTIDE: B Natriuretic Peptide: 18.4 pg/mL (ref 0.0–100.0)

## 2019-08-17 MED ORDER — MORPHINE SULFATE (PF) 4 MG/ML IV SOLN
4.0000 mg | Freq: Once | INTRAVENOUS | Status: AC
  Administered 2019-08-17: 4 mg via INTRAVENOUS
  Filled 2019-08-17: qty 1

## 2019-08-17 MED ORDER — SODIUM CHLORIDE 0.9% FLUSH: 3.0000 mL | Freq: Once | INTRAVENOUS | Status: DC

## 2019-08-17 NOTE — ED Provider Notes (Signed)
[facility] DEPT Provider Note   CSN: [id number] Arrival date & time: 08/17/19  1648     History Chief Complaint  Patient presents with  . Flank Pain  . Nausea    [name] is a 49 y.o. male.  The history is provided by the patient and medical records. No language interpreter was used.  Flank Pain This is a recurrent problem. The current episode started more than 2 days ago. The problem occurs constantly. The problem has not changed since onset.Pertinent negatives include no chest pain, no abdominal pain, no headaches and no shortness of breath. Nothing aggravates the symptoms. Nothing relieves the symptoms. He has tried nothing for the symptoms. The treatment provided no relief.       Past Medical History:  Diagnosis Date  . Anxiety   . Chronic back pain   . Depression   . History of diverticulosis   . History of kidney stones   . Kidney stones   . MRSA (methicillin resistant staph aureus) culture positive   . Substance abuse ([place])    former abuser methodone and oxycodone    Patient Active Problem List   Diagnosis Date Noted  . Cellulitis 06/09/2018  . IVDU (intravenous drug user) 06/09/2018  . ADD (attention deficit disorder) 02/02/2015  . Insomnia 04/08/2012  . Substance abuse in remission ([place]) 04/08/2012  . GAD (generalized anxiety disorder) 11/02/2011  . Chronic back pain 11/02/2011    Past Surgical History:  Procedure Laterality Date  . I&D of right arm abscess  11/14/2003  . kidney stones         Family History  Problem Relation Age of Onset  . Hypertension Mother   . Cancer Maternal Grandmother        ovarian  . Diabetes Paternal Grandfather   . Coronary artery disease Father     Social History   Tobacco Use  . Smoking status: Light Tobacco Smoker    Packs/day: 0.00    Years: 26.00    Pack years: 0.00    Last attempt to quit: 04/05/2015    Years since quitting: 4.3  . Smokeless tobacco: Never  Used  Vaping Use  . Vaping Use: Never used  Substance Use Topics  . Alcohol use: Yes    Alcohol/week: 0.0 standard drinks    Comment: 3 times per week  . Drug use: Yes    Comment: heroin daily    Home Medications Prior to Admission medications   Medication Sig Start Date End Date Taking? Authorizing Provider  acetaminophen (TYLENOL) 325 MG tablet Take 2 tablets (650 mg total) by mouth every 6 (six) hours as needed for mild pain. 06/11/18   Mercy Riding, MD  alprazolam (XANAX) 2 MG tablet TAKE 1 TABLET BY MOUTH 3 TIMES DAILY AS NEEDED FOR ANXIETY Patient taking differently: Take 2 mg by mouth 3 (three) times daily as needed for anxiety. TAKE 1 TABLET BY MOUTH 3 TIMES DAILY AS NEEDED FOR ANXIETY 12/21/15   Weber, Damaris Hippo, PA-C  amphetamine-dextroamphetamine (ADDERALL) 20 MG tablet Take 20 mg by mouth 4 (four) times daily.  01/31/14   [provider]  citalopram (CELEXA) 40 MG tablet Take 1 tablet (40 mg total) by mouth daily. 05/24/15   Leandrew Koyanagi, MD  clobetasol ointment (TEMOVATE) 3.55 % Apply 1 application topically 2 (two) times daily as needed (red spots on face).    [provider]  sildenafil (REVATIO) 20 MG tablet TAKE 1/2-2 tabs ON  EMPTY stomach 1 hour BEFORE sex. USE lowest possible DOSE. No alcohol OR benzodiazepines Patient taking differently: Take 10-20 mg by mouth as needed (erectile dysfunction). TAKE 1/2-2 tabs ON EMPTY stomach 1 hour BEFORE sex. USE lowest possible DOSE. No alcohol OR benzodiazepines 09/26/17   Vivi Barrack, MD    Allergies    Codeine and Tylenol [acetaminophen]  Review of Systems   Review of Systems  Constitutional: Positive for fatigue. Negative for chills, diaphoresis and fever.  HENT: Negative for congestion.   Respiratory: Negative for cough, chest tightness, shortness of breath and wheezing.   Cardiovascular: Positive for leg swelling. Negative for chest pain and palpitations.  Gastrointestinal: Positive for nausea and  vomiting. Negative for abdominal distention, abdominal pain, constipation and diarrhea.  Genitourinary: Positive for flank pain. Negative for dysuria, frequency and penile pain.  Musculoskeletal: Positive for myalgias. Negative for back pain (flank (ain), neck pain and neck stiffness.  Skin: Positive for rash (chronic). Negative for wound.  Neurological: Negative for dizziness, weakness, light-headedness, numbness and headaches.  Psychiatric/Behavioral: Negative for agitation and confusion.  All other systems reviewed and are negative.   Physical Exam Updated Vital Signs BP (!) 148/105 (BP Location: Left Arm)   Pulse 91   Temp 98.3 F (36.8 C) (Oral)   Resp 18   Ht 5\' 10"  (1.778 m)   Wt 93 kg   BMI 29.41 kg/m   Physical Exam Vitals and nursing note reviewed.  Constitutional:      General: He is not in acute distress.    Appearance: He is well-developed. He is not ill-appearing, toxic-appearing or diaphoretic.  HENT:     Head: Normocephalic and atraumatic.     Nose: Nose normal. No congestion or rhinorrhea.     Mouth/Throat:     Mouth: Mucous membranes are moist.     Pharynx: No oropharyngeal exudate or posterior oropharyngeal erythema.  Eyes:     Extraocular Movements: Extraocular movements intact.     Conjunctiva/sclera: Conjunctivae normal.     Pupils: Pupils are equal, round, and reactive to light.  Cardiovascular:     Rate and Rhythm: Normal rate and regular rhythm.     Pulses: Normal pulses.     Heart sounds: No murmur heard.   Pulmonary:     Effort: Pulmonary effort is normal. No respiratory distress.     Breath sounds: Normal breath sounds. No wheezing or rhonchi.  Chest:     Chest wall: No tenderness.  Abdominal:     General: Abdomen is flat.     Palpations: Abdomen is soft.     Tenderness: There is no abdominal tenderness. There is no right CVA tenderness, left CVA tenderness, guarding or rebound.  Musculoskeletal:        General: Tenderness present. No  signs of injury.     Cervical back: Neck supple. No tenderness.       Back:     Right lower leg: Edema present.     Left lower leg: Edema present.  Skin:    General: Skin is warm and dry.     Capillary Refill: Capillary refill takes less than 2 seconds.     Coloration: Skin is not pale.     Findings: Rash present. No erythema.  Neurological:     General: No focal deficit present.     Mental Status: He is alert.     Sensory: No sensory deficit.     Motor: No weakness.  Psychiatric:  Mood and Affect: Mood normal.     ED Results / Procedures / Treatments   Labs (all labs ordered are listed, but only abnormal results are displayed) Labs Reviewed  URINE CULTURE  LIPASE, BLOOD  COMPREHENSIVE METABOLIC PANEL  CBC  BRAIN NATRIURETIC PEPTIDE  CK  MAGNESIUM  URINALYSIS, ROUTINE W REFLEX MICROSCOPIC    EKG None  Radiology CT Renal Stone Study  Result Date: 08/17/2019 CLINICAL DATA:  Left flank pain EXAM: CT ABDOMEN AND PELVIS WITHOUT CONTRAST TECHNIQUE: Multidetector CT imaging of the abdomen and pelvis was performed following the standard protocol without IV contrast. COMPARISON:  12/01/2008 FINDINGS: LOWER CHEST: Normal. HEPATOBILIARY: Normal hepatic contours. No intra- or extrahepatic biliary dilatation. Status post cholecystectomy. PANCREAS: Normal pancreas. No ductal dilatation or peripancreatic fluid collection. SPLEEN: Normal. ADRENALS/URINARY TRACT: The adrenal glands are normal. Nonobstructive left nephrolithiasis measuring up to 4 mm. The urinary bladder is normal for degree of distention STOMACH/BOWEL: There is no hiatal hernia. Normal duodenal course and caliber. No small bowel dilatation or inflammation. No focal colonic abnormality. Normal appendix. VASCULAR/LYMPHATIC: Normal course and caliber of the major abdominal vessels. No abdominal or pelvic lymphadenopathy. REPRODUCTIVE: Normal prostate size with symmetric seminal vesicles. MUSCULOSKELETAL. No bony spinal  canal stenosis or focal osseous abnormality. OTHER: None. IMPRESSION: 1. No acute abnormality of the abdomen or pelvis. 2. Nonobstructive left nephrolithiasis measuring up to 4 mm. Electronically Signed   By: Ulyses Jarred M.D.   On: 08/17/2019 22:39    Procedures Procedures (including critical care time)  Medications Ordered in ED Medications  sodium chloride flush (NS) 0.9 % injection 3 mL (0 mLs Intravenous Hold 08/17/19 2150)  morphine 4 MG/ML injection 4 mg (4 mg Intravenous Given 08/17/19 2214)  morphine 4 MG/ML injection 4 mg (4 mg Intravenous Given 08/18/19 0025)    ED Course  I have reviewed the triage vital signs and the nursing notes.  Pertinent labs & imaging results that were available during my care of the patient were reviewed by me and considered in my medical decision making (see chart for details).    MDM Rules/Calculators/A&P                          [name] is a 49 y.o. male with a past medical history significant for IV drug abuse with prior admissions for cellulitis, anxiety, chronic pains, prior diverticulitis, and prior kidney stones who presents with several days of severe left flank pain that feels like kidney stone.  He reports he has had some nausea and vomiting is waxing and waning.  He reports the pain is severe.  He reports of the last month or 2 he has been having muscle cramps, myalgias, and bilateral lower extremity edema.  He reports that he has not seen a doctor for those complaints but he is concerned about the new flank pain for kidney stone.  He denies any constipation or diarrhea.  Denies any urinary changes to his knowledge.  Denies any trauma.  Denies other complaints.  On exam, lungs are clear and chest is nontender.  No murmur was appreciated.  Abdomen was nontender but patient did have left flank tenderness.  No left CVA tenderness.  He does not have midline back tenderness.  No rash seen on his back or flank area where he has pain.  No  evidence of shingles on exam.  Patient does have diffuse rashes that he reports are improving.  He denies evidence or  sensation of abscess in his extremities from his IV drug use.  He reports he did relapse more recently and is using heroin again.  He reports that after he was admitted last year for cellulitis he was clean for nearly 8 months.  Clinically I am concerned for possible kidney stone.  We will get a CT stone study as well as labs.  For the patient's edema, will get a BNP and check his kidney function.  With the myalgias and cramps he has been having on and off we will get a CK to look for rhabdo or some sort of myositis.  Based on the location of his discomfort, I would low suspicion for some sort of epidural abscess and he denies any new weakness or numbness or loss of bowel or bladder control.  Patient agrees with work-up.  Will give pain medicine as I am concerned about acute kidney stone primarily.  Anticipate reassessment after work-up.  12:35 AM Work-up is begun to return.  BNP not elevated and CK is normal.  Electrolytes reassuring.  No evidence of AKI.    CT stone study shows he does have some kidney stones on the left side that are nonobstructive and or not in the ureter.  Given the clinical description, I suspect either a pyelonephritis versus a recently passed stone.  He has not yet urinated.  Awaiting urinalysis.  Care transferred to Dr. Randal Buba while waiting for results of urinalysis.  If urinalysis is reassuring, anticipate outpatient follow-up with urology for recently passed stone causing his left flank pain consistent with prior.  Otherwise anticipate discharge home.      Final Clinical Impression(s) / ED Diagnoses Final diagnoses:  Left flank pain  Nausea  Nausea and vomiting, intractability of vomiting not specified, unspecified vomiting type    Clinical Impression: 1. Left flank pain   2. Nausea   3. Nausea and vomiting, intractability of vomiting not specified,  unspecified vomiting type     Disposition: Awaiting urine to return.  Care transferred to Dr. Joneen Caraway while waiting for results of urinalysis.  If it is negative, anticipate discharge home for outpatient follow-up for likely recently passed kidney stone causing his discomfort.   This note was prepared with assistance of Systems analyst. Occasional wrong-word or sound-a-like substitutions may have occurred due to the inherent limitations of voice recognition software.      Alicha Raspberry, Gwenyth Allegra, MD 08/18/19 1009

## 2019-08-17 NOTE — ED Triage Notes (Signed)
C/O left flank pain X1 week ago and has progressed.  C/O N/V C/O leg cramps and swollen ankles.   6/10 pain    A/OX4 Ambulatory in triage.   Hx. Kidney stones.

## 2019-08-17 NOTE — ED Notes (Signed)
IV attempted on patient, Unsuccessful x2

## 2019-08-18 LAB — URINALYSIS, ROUTINE W REFLEX MICROSCOPIC
Bacteria, UA: NONE SEEN
Bilirubin Urine: NEGATIVE
Glucose, UA: NEGATIVE mg/dL
Hgb urine dipstick: NEGATIVE
Ketones, ur: NEGATIVE mg/dL
Nitrite: NEGATIVE
Protein, ur: NEGATIVE mg/dL
Specific Gravity, Urine: 1.025 (ref 1.005–1.030)
pH: 5 (ref 5.0–8.0)

## 2019-08-18 MED ORDER — MORPHINE SULFATE (PF) 4 MG/ML IV SOLN
4.0000 mg | Freq: Once | INTRAVENOUS | Status: AC
  Administered 2019-08-18: 4 mg via INTRAVENOUS
  Filled 2019-08-18: qty 1

## 2019-08-18 MED ORDER — CEPHALEXIN 500 MG PO CAPS
500.0000 mg | ORAL_CAPSULE | Freq: Two times a day (BID) | ORAL | 0 refills | Status: DC
Start: 2019-08-18 — End: 2020-09-16

## 2019-08-19 LAB — URINE CULTURE: Culture: 10000 — AB

## 2020-09-15 ENCOUNTER — Inpatient Hospital Stay (HOSPITAL_BASED_OUTPATIENT_CLINIC_OR_DEPARTMENT_OTHER)
Admission: EM | Admit: 2020-09-15 | Discharge: 2020-09-20 | DRG: 603 | Disposition: A | Discharge status: Home / Self Care | Payer: 59 | Attending: Internal Medicine | Admitting: Internal Medicine

## 2020-09-15 ENCOUNTER — Encounter (HOSPITAL_BASED_OUTPATIENT_CLINIC_OR_DEPARTMENT_OTHER): Payer: Self-pay

## 2020-09-15 ENCOUNTER — Other Ambulatory Visit: Payer: Self-pay

## 2020-09-15 DIAGNOSIS — M545 Low back pain, unspecified: Secondary | ICD-10-CM | POA: Diagnosis present

## 2020-09-15 DIAGNOSIS — F1123 Opioid dependence with withdrawal: Secondary | ICD-10-CM | POA: Diagnosis present

## 2020-09-15 DIAGNOSIS — F17210 Nicotine dependence, cigarettes, uncomplicated: Secondary | ICD-10-CM | POA: Diagnosis present

## 2020-09-15 DIAGNOSIS — L739 Follicular disorder, unspecified: Secondary | ICD-10-CM | POA: Diagnosis present

## 2020-09-15 DIAGNOSIS — F32A Depression, unspecified: Secondary | ICD-10-CM | POA: Diagnosis present

## 2020-09-15 DIAGNOSIS — Z1629 Resistance to other single specified antibiotic: Secondary | ICD-10-CM

## 2020-09-15 DIAGNOSIS — Z885 Allergy status to narcotic agent status: Secondary | ICD-10-CM

## 2020-09-15 DIAGNOSIS — L03115 Cellulitis of right lower limb: Principal | ICD-10-CM | POA: Diagnosis present

## 2020-09-15 DIAGNOSIS — F988 Other specified behavioral and emotional disorders with onset usually occurring in childhood and adolescence: Secondary | ICD-10-CM | POA: Diagnosis present

## 2020-09-15 DIAGNOSIS — Z886 Allergy status to analgesic agent status: Secondary | ICD-10-CM

## 2020-09-15 DIAGNOSIS — F411 Generalized anxiety disorder: Secondary | ICD-10-CM | POA: Diagnosis present

## 2020-09-15 DIAGNOSIS — Z79899 Other long term (current) drug therapy: Secondary | ICD-10-CM

## 2020-09-15 DIAGNOSIS — L0390 Cellulitis, unspecified: Secondary | ICD-10-CM | POA: Diagnosis present

## 2020-09-15 DIAGNOSIS — R7881 Bacteremia: Secondary | ICD-10-CM

## 2020-09-15 DIAGNOSIS — Z20822 Contact with and (suspected) exposure to covid-19: Secondary | ICD-10-CM | POA: Diagnosis present

## 2020-09-15 DIAGNOSIS — E038 Other specified hypothyroidism: Secondary | ICD-10-CM | POA: Diagnosis present

## 2020-09-15 DIAGNOSIS — Z8249 Family history of ischemic heart disease and other diseases of the circulatory system: Secondary | ICD-10-CM

## 2020-09-15 DIAGNOSIS — G8929 Other chronic pain: Secondary | ICD-10-CM | POA: Diagnosis present

## 2020-09-15 DIAGNOSIS — I10 Essential (primary) hypertension: Secondary | ICD-10-CM | POA: Diagnosis present

## 2020-09-15 DIAGNOSIS — F1911 Other psychoactive substance abuse, in remission: Secondary | ICD-10-CM | POA: Diagnosis present

## 2020-09-15 DIAGNOSIS — F1510 Other stimulant abuse, uncomplicated: Secondary | ICD-10-CM | POA: Diagnosis present

## 2020-09-15 DIAGNOSIS — R600 Localized edema: Secondary | ICD-10-CM | POA: Diagnosis present

## 2020-09-15 DIAGNOSIS — D649 Anemia, unspecified: Secondary | ICD-10-CM | POA: Diagnosis present

## 2020-09-15 DIAGNOSIS — F1990 Other psychoactive substance use, unspecified, uncomplicated: Secondary | ICD-10-CM | POA: Diagnosis present

## 2020-09-15 DIAGNOSIS — E8809 Other disorders of plasma-protein metabolism, not elsewhere classified: Secondary | ICD-10-CM | POA: Diagnosis present

## 2020-09-15 HISTORY — DX: Essential (primary) hypertension: I10

## 2020-09-15 NOTE — ED Triage Notes (Addendum)
Pt was sent from [facility] for bilateral ankle swelling x one month. Pt states his ankles have become progressively more painful and swollen. Pt was told he needs to be on diuretics or abx. No xrays were performed. Pt is supposed to be on losartan but has not been taking it. Hx of IV drug use.

## 2020-09-16 ENCOUNTER — Inpatient Hospital Stay (HOSPITAL_COMMUNITY): Payer: 59

## 2020-09-16 ENCOUNTER — Emergency Department (HOSPITAL_BASED_OUTPATIENT_CLINIC_OR_DEPARTMENT_OTHER): Payer: 59

## 2020-09-16 DIAGNOSIS — I10 Essential (primary) hypertension: Secondary | ICD-10-CM | POA: Diagnosis present

## 2020-09-16 DIAGNOSIS — R600 Localized edema: Secondary | ICD-10-CM | POA: Diagnosis present

## 2020-09-16 DIAGNOSIS — M545 Low back pain, unspecified: Secondary | ICD-10-CM | POA: Diagnosis present

## 2020-09-16 DIAGNOSIS — F411 Generalized anxiety disorder: Secondary | ICD-10-CM

## 2020-09-16 DIAGNOSIS — L739 Follicular disorder, unspecified: Secondary | ICD-10-CM | POA: Diagnosis present

## 2020-09-16 DIAGNOSIS — F1990 Other psychoactive substance use, unspecified, uncomplicated: Secondary | ICD-10-CM | POA: Diagnosis not present

## 2020-09-16 DIAGNOSIS — F32A Depression, unspecified: Secondary | ICD-10-CM | POA: Diagnosis present

## 2020-09-16 DIAGNOSIS — D649 Anemia, unspecified: Secondary | ICD-10-CM | POA: Diagnosis present

## 2020-09-16 DIAGNOSIS — F17210 Nicotine dependence, cigarettes, uncomplicated: Secondary | ICD-10-CM | POA: Diagnosis present

## 2020-09-16 DIAGNOSIS — Z79899 Other long term (current) drug therapy: Secondary | ICD-10-CM | POA: Diagnosis not present

## 2020-09-16 DIAGNOSIS — B957 Other staphylococcus as the cause of diseases classified elsewhere: Secondary | ICD-10-CM | POA: Diagnosis not present

## 2020-09-16 DIAGNOSIS — E038 Other specified hypothyroidism: Secondary | ICD-10-CM | POA: Diagnosis present

## 2020-09-16 DIAGNOSIS — L03115 Cellulitis of right lower limb: Secondary | ICD-10-CM | POA: Diagnosis present

## 2020-09-16 DIAGNOSIS — F1911 Other psychoactive substance abuse, in remission: Secondary | ICD-10-CM

## 2020-09-16 DIAGNOSIS — Z885 Allergy status to narcotic agent status: Secondary | ICD-10-CM | POA: Diagnosis not present

## 2020-09-16 DIAGNOSIS — F1510 Other stimulant abuse, uncomplicated: Secondary | ICD-10-CM | POA: Diagnosis present

## 2020-09-16 DIAGNOSIS — F902 Attention-deficit hyperactivity disorder, combined type: Secondary | ICD-10-CM | POA: Diagnosis not present

## 2020-09-16 DIAGNOSIS — L0390 Cellulitis, unspecified: Secondary | ICD-10-CM | POA: Diagnosis present

## 2020-09-16 DIAGNOSIS — F1123 Opioid dependence with withdrawal: Secondary | ICD-10-CM | POA: Diagnosis present

## 2020-09-16 DIAGNOSIS — E8809 Other disorders of plasma-protein metabolism, not elsewhere classified: Secondary | ICD-10-CM | POA: Diagnosis present

## 2020-09-16 DIAGNOSIS — Z8249 Family history of ischemic heart disease and other diseases of the circulatory system: Secondary | ICD-10-CM | POA: Diagnosis not present

## 2020-09-16 DIAGNOSIS — R7881 Bacteremia: Secondary | ICD-10-CM | POA: Diagnosis not present

## 2020-09-16 DIAGNOSIS — F988 Other specified behavioral and emotional disorders with onset usually occurring in childhood and adolescence: Secondary | ICD-10-CM | POA: Diagnosis present

## 2020-09-16 DIAGNOSIS — Z20822 Contact with and (suspected) exposure to covid-19: Secondary | ICD-10-CM | POA: Diagnosis present

## 2020-09-16 DIAGNOSIS — Z886 Allergy status to analgesic agent status: Secondary | ICD-10-CM | POA: Diagnosis not present

## 2020-09-16 DIAGNOSIS — G8929 Other chronic pain: Secondary | ICD-10-CM | POA: Diagnosis present

## 2020-09-16 LAB — COMPREHENSIVE METABOLIC PANEL
ALT: 13 U/L (ref 0–44)
AST: 13 U/L — ABNORMAL LOW (ref 15–41)
Albumin: 3.4 g/dL — ABNORMAL LOW (ref 3.5–5.0)
Alkaline Phosphatase: 85 U/L (ref 38–126)
Anion gap: 8 (ref 5–15)
BUN: 18 mg/dL (ref 6–20)
CO2: 28 mmol/L (ref 22–32)
Calcium: 8.6 mg/dL — ABNORMAL LOW (ref 8.9–10.3)
Chloride: 101 mmol/L (ref 98–111)
Creatinine, Ser: 1.06 mg/dL (ref 0.61–1.24)
GFR, Estimated: >60 mL/min (ref >60)
Glucose, Bld: 104 mg/dL — ABNORMAL HIGH (ref 70–99)
Potassium: 3.8 mmol/L (ref 3.5–5.1)
Sodium: 137 mmol/L (ref 135–145)
Total Bilirubin: 0.2 mg/dL — ABNORMAL LOW (ref 0.3–1.2)
Total Protein: 6.9 g/dL (ref 6.5–8.1)

## 2020-09-16 LAB — SEDIMENTATION RATE: Sed Rate: 47 mm/hr — ABNORMAL HIGH (ref 0–16)

## 2020-09-16 LAB — CBC WITH DIFFERENTIAL/PLATELET
Abs Immature Granulocytes: 0.01 10*3/uL (ref 0.00–0.07)
Basophils Absolute: 0 10*3/uL (ref 0.0–0.1)
Basophils Relative: 0 %
Eosinophils Absolute: 0.1 10*3/uL (ref 0.0–0.5)
Eosinophils Relative: 2 %
HCT: 34.8 % — ABNORMAL LOW (ref 39.0–52.0)
Hemoglobin: 11.4 g/dL — ABNORMAL LOW (ref 13.0–17.0)
Immature Granulocytes: 0 %
Lymphocytes Relative: 22 %
Lymphs Abs: 1.5 10*3/uL (ref 0.7–4.0)
MCH: 27.5 pg (ref 26.0–34.0)
MCHC: 32.8 g/dL (ref 30.0–36.0)
MCV: 83.9 fL (ref 80.0–100.0)
Monocytes Absolute: 0.6 10*3/uL (ref 0.1–1.0)
Monocytes Relative: 8 %
Neutro Abs: 4.5 10*3/uL (ref 1.7–7.7)
Neutrophils Relative %: 68 %
Platelets: 279 10*3/uL (ref 150–400)
RBC: 4.15 MIL/uL — ABNORMAL LOW (ref 4.22–5.81)
RDW: 14.9 % (ref 11.5–15.5)
WBC: 6.7 10*3/uL (ref 4.0–10.5)
nRBC: 0 % (ref 0.0–0.2)

## 2020-09-16 LAB — RESP PANEL BY RT-PCR (FLU A&B, COVID) ARPGX2
Influenza A by PCR: NEGATIVE
Influenza B by PCR: NEGATIVE
SARS Coronavirus 2 by RT PCR: NEGATIVE

## 2020-09-16 LAB — C-REACTIVE PROTEIN: CRP: 3.1 mg/dL — ABNORMAL HIGH (ref <1.0)

## 2020-09-16 LAB — LACTIC ACID, PLASMA: Lactic Acid, Venous: 1 mmol/L (ref 0.5–1.9)

## 2020-09-16 MED ORDER — IBUPROFEN 800 MG PO TABS
ORAL_TABLET | ORAL | Status: AC
  Filled 2020-09-16: qty 1

## 2020-09-16 MED ORDER — LORAZEPAM 2 MG/ML IJ SOLN
1.0000 mg | Freq: Once | INTRAMUSCULAR | Status: AC
  Administered 2020-09-16: 1 mg via INTRAVENOUS
  Filled 2020-09-16: qty 1

## 2020-09-16 MED ORDER — FUROSEMIDE 10 MG/ML IJ SOLN
40.0000 mg | Freq: Once | INTRAMUSCULAR | Status: AC
  Administered 2020-09-16: 40 mg via INTRAVENOUS
  Filled 2020-09-16: qty 4

## 2020-09-16 MED ORDER — HYDRALAZINE HCL 20 MG/ML IJ SOLN: 10.0000 mg | INTRAMUSCULAR | Status: DC | PRN

## 2020-09-16 MED ORDER — ENOXAPARIN SODIUM 40 MG/0.4ML IJ SOSY
40.0000 mg | PREFILLED_SYRINGE | INTRAMUSCULAR | Status: DC
  Filled 2020-09-16 (×2): qty 0.4

## 2020-09-16 MED ORDER — KETOROLAC TROMETHAMINE 15 MG/ML IJ SOLN: 15.0000 mg | Freq: Four times a day (QID) | INTRAMUSCULAR | Status: DC | PRN

## 2020-09-16 MED ORDER — SODIUM CHLORIDE 0.9% FLUSH
3.0000 mL | Freq: Two times a day (BID) | INTRAVENOUS | Status: DC
  Administered 2020-09-16 – 2020-09-19 (×6): 3 mL via INTRAVENOUS

## 2020-09-16 MED ORDER — ALPRAZOLAM 0.5 MG PO TABS
2.0000 mg | ORAL_TABLET | Freq: Three times a day (TID) | ORAL | Status: DC | PRN
  Administered 2020-09-16 – 2020-09-19 (×2): 2 mg via ORAL
  Filled 2020-09-16 (×5): qty 4

## 2020-09-16 MED ORDER — IBUPROFEN 800 MG PO TABS
800.0000 mg | ORAL_TABLET | Freq: Once | ORAL | Status: AC
  Administered 2020-09-16: 800 mg via ORAL

## 2020-09-16 MED ORDER — VANCOMYCIN HCL IN DEXTROSE 1-5 GM/200ML-% IV SOLN
1000.0000 mg | Freq: Once | INTRAVENOUS | Status: AC
  Administered 2020-09-16: 1000 mg via INTRAVENOUS
  Filled 2020-09-16: qty 200

## 2020-09-16 MED ORDER — VANCOMYCIN HCL IN DEXTROSE 1-5 GM/200ML-% IV SOLN
1000.0000 mg | Freq: Two times a day (BID) | INTRAVENOUS | Status: DC
  Administered 2020-09-16 – 2020-09-19 (×6): 1000 mg via INTRAVENOUS
  Filled 2020-09-16 (×9): qty 200

## 2020-09-16 MED ORDER — IOHEXOL 300 MG/ML  SOLN
100.0000 mL | Freq: Once | INTRAMUSCULAR | Status: AC | PRN
  Administered 2020-09-16: 100 mL via INTRAVENOUS

## 2020-09-16 NOTE — Progress Notes (Addendum)
NEW ADMISSION NOTE New Admission Note:   Arrival Method: [facility] stretcher bed Mental Orientation: Alert and oriented x 4 Telemetry:None ordered. Assessment: Completed Skin: Non pressure scab over,infected wound           on his right knee 1.5 x 2 with cellulitis            Induration around it 6.5 x 5.Assessed with             Skin dry ,scaly,needle marks? All over on            his upper extremities. Assessed with             Gwenette Greet R.N. IV:   Left Forearm NSL Pain: Denies Tubes: None  Safety Measures: Safety Fall Prevention Plan has been given, discussed and signed Admission: Completed [facility] Orientation: Patient has been orientated to the room, unit and staff.  Family: Mother at the bedside ,made aware of               Visitation policy.  Orders have been reviewed and implemented. Will continue to monitor the patient. Call light has been placed within reach and bed alarm has been activated.   [place], Zenon Mayo, RN

## 2020-09-16 NOTE — H&P (Addendum)
History and Physical    [name] KNL:[id number] DOB: [birth year] DOA: 09/15/2020  Referring MD/NP/PA: Carlynn Purl, MD PCP: Patient, No Pcp Per (Inactive)  Patient coming from: Transfer from [place]  Chief Complaint: Leg swelling  I have personally briefly reviewed patient's old medical records in [place]   HPI: [name] is a 50 y.o. male with medical history significant of HTN, remote IV drug abuse, anxiety, and subclinical hypothyroidism presents with complaints of leg swelling.  He reports his legs and ankle have been swelling intermittently for couple months, but acutely worsened over the last month.  Elevating his legs seems to no longer be helping.  Reports having heaviness feeling in his feet and pain when he is trying to walk.  He does admit that he has a red area over his right knee that is new.  Denies having any significant drainage from the wound.  However reports that he is not used any IV drugs in over 5 years.  He reports having some shortness of breath.  Denies having any fever, nausea, vomiting, diarrhea, or cough.  He is unsure if his weight has changed any during this time.  Patient also reported having severe back pain in his lower back that acutely worsened today.  He initially thought symptoms were secondary to him laying in the hospital bed, but reports that he was crying in bed the pain was so severe.  ED Course: Upon admission into the emergency department patient was noted to be afebrile, pulse 77-1 19, blood pressures elevated up to 178/92, and all other vital signs maintained.  Labs significant for labs significant for WBC 6.7, hemoglobin 11.4, lactic acid 1, sed rate 47, and CRP pending.  Chest x-ray showed no acute abnormalities x-rays of the bilateral ankles were within normal limits.  Blood cultures have been obtained and patient was given 1 mg Ativan IV ibuprofen 800 mg, and started on vancomycin  Review of Systems   Constitutional:  Negative for fever.  HENT:  Negative for nosebleeds.   Eyes:  Negative for photophobia and pain.  Respiratory:  Positive for shortness of breath.   Cardiovascular:  Positive for leg swelling. Negative for chest pain.  Gastrointestinal:  Negative for abdominal pain, nausea and vomiting.  Genitourinary:  Negative for dysuria.  Musculoskeletal:  Positive for myalgias. Negative for joint pain.  Skin:  Positive for rash.  Neurological:  Negative for focal weakness and loss of consciousness.  Psychiatric/Behavioral:  Negative for memory loss and substance abuse.    Past Medical History:  Diagnosis Date   Anxiety    Chronic back pain    Depression    History of diverticulosis    History of kidney stones    Hypertension    Kidney stones    MRSA (methicillin resistant staph aureus) culture positive    Substance abuse ([place])    former abuser methodone and oxycodone    Past Surgical History:  Procedure Laterality Date   I&D of right arm abscess  11/14/2003   kidney stones       reports that he has been smoking cigarettes. He has never used smokeless tobacco. He reports current alcohol use. He reports current drug use.  Allergies  Allergen Reactions   Codeine Itching and Nausea Only   Tylenol [Acetaminophen] Other (See Comments)    headache    Family History  Problem Relation Age of Onset   Hypertension Mother    Cancer Maternal Grandmother  ovarian   Diabetes Paternal Grandfather    Coronary artery disease Father     Prior to Admission medications   Medication Sig Start Date End Date Taking? Authorizing Provider  acetaminophen (TYLENOL) 325 MG tablet Take 2 tablets (650 mg total) by mouth every 6 (six) hours as needed for mild pain. 06/11/18   Mercy Riding, MD  alprazolam (XANAX) 2 MG tablet TAKE 1 TABLET BY MOUTH 3 TIMES DAILY AS NEEDED FOR ANXIETY Patient taking differently: Take 2 mg by mouth 3 (three) times daily as needed for anxiety. TAKE 1  TABLET BY MOUTH 3 TIMES DAILY AS NEEDED FOR ANXIETY 12/21/15   Weber, Damaris Hippo, PA-C  amphetamine-dextroamphetamine (ADDERALL) 20 MG tablet Take 20 mg by mouth 4 (four) times daily.  01/31/14   [provider]  cephALEXin (KEFLEX) 500 MG capsule Take 1 capsule (500 mg total) by mouth 2 (two) times daily. 08/18/19   Palumbo, April, MD  citalopram (CELEXA) 40 MG tablet Take 1 tablet (40 mg total) by mouth daily. 05/24/15   Leandrew Koyanagi, MD  clobetasol ointment (TEMOVATE) 3.49 % Apply 1 application topically 2 (two) times daily as needed (red spots on face).    [provider]    Physical Exam:  Constitutional: Middle-age male who appears to be no acute distress Vitals:   09/16/20 0900 09/16/20 1100 09/16/20 1405 09/16/20 1411  BP: (!) 142/107 (!) 157/86 (!) 144/98   Pulse:  (!) 119 98   Resp:  18 16   Temp:   97.8 F (36.6 C)   TempSrc:   Oral   SpO2:  100% 100%   Weight:    98.7 kg  Height:       Eyes: PERRL, lids and conjunctivae normal ENMT: Mucous membranes are moist. Posterior pharynx clear of any exudate or lesions.  Neck: normal, supple, no masses, no thyromegaly Respiratory: clear to auscultation bilaterally, no wheezing, no crackles. Normal respiratory effort. No accessory muscle use.  Cardiovascular: Regular rate and rhythm, no murmurs / rubs / gallops.  Swelling appreciated of the bilateral lower extremities. 2+ pedal pulses. No carotid bruits.  Abdomen: no tenderness, no masses palpated. No hepatosplenomegaly. Bowel sounds positive.  Musculoskeletal: no clubbing / cyanosis.  Patient is able to bend the right knee, but noted to have severe tenderness to palpation of the lumbar spine with palpation Skin: Multiple healed wounds noted of the bilateral upper and lower extremities.   Large circular area of erythema noted on the lateral aspect of the right knee without.  Neurologic: CN 2-12 grossly intact. Sensation intact, DTR normal. Strength 5/5 in all 4.   Psychiatric: Normal judgment and insight. Alert and oriented x 3. Normal mood.     Labs on Admission: I have personally reviewed following labs and imaging studies  CBC: Recent Labs  Lab 09/16/20 0112  WBC 6.7  NEUTROABS 4.5  HGB 11.4*  HCT 34.8*  MCV 83.9  PLT 179   Basic Metabolic Panel: Recent Labs  Lab 09/16/20 0112  NA 137  K 3.8  CL 101  CO2 28  GLUCOSE 104*  BUN 18  CREATININE 1.06  CALCIUM 8.6*   GFR: Estimated Creatinine Clearance: 100.4 mL/min (by C-G formula based on SCr of 1.06 mg/dL). Liver Function Tests: Recent Labs  Lab 09/16/20 0112  AST 13*  ALT 13  ALKPHOS 85  BILITOT 0.2*  PROT 6.9  ALBUMIN 3.4*   No results for input(s): LIPASE, AMYLASE in the last 168 hours. No results for  input(s): AMMONIA in the last 168 hours. Coagulation Profile: No results for input(s): INR, PROTIME in the last 168 hours. Cardiac Enzymes: No results for input(s): CKTOTAL, CKMB, CKMBINDEX, TROPONINI in the last 168 hours. BNP (last 3 results) No results for input(s): PROBNP in the last 8760 hours. HbA1C: No results for input(s): HGBA1C in the last 72 hours. CBG: No results for input(s): GLUCAP in the last 168 hours. Lipid Profile: No results for input(s): CHOL, HDL, LDLCALC, TRIG, CHOLHDL, LDLDIRECT in the last 72 hours. Thyroid Function Tests: No results for input(s): TSH, T4TOTAL, FREET4, T3FREE, THYROIDAB in the last 72 hours. Anemia Panel: No results for input(s): VITAMINB12, FOLATE, FERRITIN, TIBC, IRON, RETICCTPCT in the last 72 hours. Urine analysis:    Component Value Date/Time   COLORURINE YELLOW 08/18/2019 0030   APPEARANCEUR CLEAR 08/18/2019 0030   LABSPEC 1.025 08/18/2019 0030   PHURINE 5.0 08/18/2019 0030   GLUCOSEU NEGATIVE 08/18/2019 0030   HGBUR NEGATIVE 08/18/2019 0030   BILIRUBINUR NEGATIVE 08/18/2019 0030   KETONESUR NEGATIVE 08/18/2019 0030   PROTEINUR NEGATIVE 08/18/2019 0030   UROBILINOGEN 0.2 12/01/2008 0412   NITRITE NEGATIVE  08/18/2019 0030   LEUKOCYTESUR TRACE (A) 08/18/2019 0030   Sepsis Labs: Recent Results (from the past 240 hour(s))  Culture, blood (Routine X 2) w Reflex to ID Panel     Status: None (Preliminary result)   Collection Time: 09/16/20  1:12 AM   Specimen: BLOOD LEFT FOREARM  Result Value Ref Range Status   Specimen Description   Final    BLOOD LEFT FOREARM Performed at [facility], [place] [address] [place], [place] [address]    Special Requests   Final    BOTTLES DRAWN AEROBIC AND ANAEROBIC Blood Culture adequate volume Performed at [facility], [address] [place], [place] [address]    Culture PENDING  Incomplete   Report Status PENDING  Incomplete  Resp Panel by RT-PCR (Flu A&B, Covid) Nasopharyngeal Swab     Status: None   Collection Time: 09/16/20  4:31 AM   Specimen: Nasopharyngeal Swab; Nasopharyngeal(NP) swabs in vial transport medium  Result Value Ref Range Status   SARS Coronavirus 2 by RT PCR NEGATIVE NEGATIVE Final    Comment: (NOTE) SARS-CoV-2 target nucleic acids are NOT DETECTED.  The SARS-CoV-2 RNA is generally detectable in upper respiratory specimens during the acute phase of infection. The lowest concentration of SARS-CoV-2 viral copies this assay can detect is 138 copies/mL. A negative result does not preclude SARS-Cov-2 infection and should not be used as the sole basis for treatment or other patient management decisions. A negative result may occur with  improper specimen collection/handling, submission of specimen other than nasopharyngeal swab, presence of viral mutation(s) within the areas targeted by this assay, and inadequate number of viral copies(<138 copies/mL). A negative result must be combined with clinical observations, patient history, and epidemiological information. The expected result is Negative.  Fact Sheet for Patients:  EntrepreneurPulse.com.au  Fact Sheet for Healthcare Providers:   IncredibleEmployment.be  This test is no t yet approved or cleared by the [place] FDA and  has been authorized for detection and/or diagnosis of SARS-CoV-2 by FDA under an Emergency Use Authorization (EUA). This EUA will remain  in effect (meaning this test can be used) for the duration of the COVID-19 declaration under Section 564(b)(1) of the Act, 21 U.S.C.section 360bbb-3(b)(1), unless the authorization is terminated  or revoked sooner.       Influenza A by PCR NEGATIVE NEGATIVE Final  Influenza B by PCR NEGATIVE NEGATIVE Final    Comment: (NOTE) The Xpert Xpress SARS-CoV-2/FLU/RSV plus assay is intended as an aid in the diagnosis of influenza from Nasopharyngeal swab specimens and should not be used as a sole basis for treatment. Nasal washings and aspirates are unacceptable for Xpert Xpress SARS-CoV-2/FLU/RSV testing.  Fact Sheet for Patients: EntrepreneurPulse.com.au  Fact Sheet for Healthcare Providers: IncredibleEmployment.be  This test is not yet approved or cleared by the [place] FDA and has been authorized for detection and/or diagnosis of SARS-CoV-2 by FDA under an Emergency Use Authorization (EUA). This EUA will remain in effect (meaning this test can be used) for the duration of the COVID-19 declaration under Section 564(b)(1) of the Act, 21 U.S.C. section 360bbb-3(b)(1), unless the authorization is terminated or revoked.  Performed at [facility], [address], [place], [place] [address]      Radiological Exams on Admission: DG Ankle Complete Left  Result Date: 09/16/2020 CLINICAL DATA:  Pain, swelling EXAM: LEFT ANKLE COMPLETE - 3+ VIEW COMPARISON:  None. FINDINGS: Diffuse soft tissue swelling. No acute bony abnormality. Specifically, no fracture, subluxation, or dislocation. Joint spaces maintained. IMPRESSION: No acute bony abnormality. Electronically Signed   By:  Rolm Baptise M.D.   On: 09/16/2020 01:47   DG Ankle Complete Right  Result Date: 09/16/2020 CLINICAL DATA:  Pain, swelling EXAM: RIGHT ANKLE - COMPLETE 3+ VIEW COMPARISON:  None. FINDINGS: Diffuse soft tissue swelling. No acute bony abnormality. Specifically, no fracture, subluxation, or dislocation. Joint spaces maintained. IMPRESSION: No acute bony abnormality. Electronically Signed   By: Rolm Baptise M.D.   On: 09/16/2020 01:47   DG Chest Port 1 View  Result Date: 09/16/2020 CLINICAL DATA:  Bilateral ankle pain, swelling for 1 month EXAM: PORTABLE CHEST 1 VIEW COMPARISON:  07/19/2018 FINDINGS: The heart size and mediastinal contours are within normal limits. Both lungs are clear. The visualized skeletal structures are unremarkable. IMPRESSION: Normal study. Electronically Signed   By: Rolm Baptise M.D.   On: 09/16/2020 01:43    EKG: Independently reviewed.  Normal sinus rhythm at 85 bpm  Assessment/Plan Cellulitis of the right knee: Acute.  Patient notes redness and erythema of the right side of his knee.  ESR elevated at 47.  Multiple prior admissions with cellulitis in the past and history of MRSA.  No significant fluctuance was appreciated around the knee to give concern for abscess at this time. -Admit to MedSurg bed -Cellulitis order set utilized -Follow-up blood cultures -Follow-up CRP -Check x-ray of the right knee -Continue vancomycin per pharmacy -Discussed with Dr. Tamera Punt of orthopedics who recommended antibiotics and monitoring to see for clinical improvement.  Lower suspicion for septic arthritis at this time.  Recommended possible ultrasound/CT if there was concern for underlying abscess.  Lower extremity edema: Acute.  Patient reports having progressively worsening lower extremity swelling.  Cause of symptoms is not totally clear at this point in time. -Strict I&Os -Daily weight -Give Lasix 40 mg IV x1 dose  Lumbar back pain: Acute on chronic.  Patient reports normally  having some back pain, but reported acutely more severe than previously in the past. -Check CT scan of the lumbar spine  Essential hypertension: Blood pressures elevated up to 178/92 on admission.  He appears not to be on any medications for treatment. -Hydralazine IV as needed for elevated blood pressure greater than 180  Normocytic anemia: Hemoglobin 11.4 g/dL which is lower than previous, but otherwise patient's vital signs are stable and he does  not report any complaints of bleeding. -Recheck CBC tomorrow morning  Subclinical hypothyroidism: T SH-4.553 and free T4-0.96 in 07/2018.  Hypothyroidism could be a cause for progressively worsening swelling.  Patient has no significant family history of thyroid disease -Check TSH and free T4  ADD -Continue Adderall in the outpatient setting  Anxiety: Patient has prescription for Xanax 2 mg 3 times daily as needed for anxiety -Continue Xanax as needed  Hypoalbuminemia:.  Acute albumin just mildly low at 3.4.  Cause of swelling less likely thought to be related to protein calorie malnutrition. -Check prealbumin  IV drug use in remission: Patient reports that he used to shoot up IV oxycodone -Check HIV, RPR, and acute hepatitis panel DVT prophylaxis: Lovenox Code Status: Full Family Communication: Patient's mother updated best Disposition Plan: To be determined Consults called: None Admission status: Inpatient, require more than 2 midnight stay  Norval Morton MD [facility]   If 7PM-7AM, please contact night-coverage   09/16/2020, 2:43 PM

## 2020-09-16 NOTE — Plan of Care (Signed)
  Problem: Activity: Goal: Risk for activity intolerance will decrease Outcome: Progressing   

## 2020-09-16 NOTE — ED Provider Notes (Signed)
[facility] [place] EMERGENCY DEPT Provider Note   CSN: [id number] Arrival date & time: 09/15/20  2002     History Chief Complaint  Patient presents with   Joint Swelling   Ankle Pain    [name] is a 50 y.o. male.  Patient presents to the emergency department for evaluation of bilateral ankle pain and swelling.  Symptoms have been progressive for 1 month.  Patient having difficulty walking because of severe pain.  Patient referred here from urgent care.  Patient reports no injury to the ankles.  He does have some skin infection on the right knee, unclear etiology.  Patient does admit to IV drug use.  He has not injected in areas of infection on the right knee and lower leg.      Past Medical History:  Diagnosis Date   Anxiety    Chronic back pain    Depression    History of diverticulosis    History of kidney stones    Hypertension    Kidney stones    MRSA (methicillin resistant staph aureus) culture positive    Substance abuse ([place])    former abuser methodone and oxycodone    Patient Active Problem List   Diagnosis Date Noted   Cellulitis 06/09/2018   IVDU (intravenous drug user) 06/09/2018   ADD (attention deficit disorder) 02/02/2015   Insomnia 04/08/2012   Substance abuse in remission ([place]) 04/08/2012   GAD (generalized anxiety disorder) 11/02/2011   Chronic back pain 11/02/2011    Past Surgical History:  Procedure Laterality Date   I&D of right arm abscess  11/14/2003   kidney stones         Family History  Problem Relation Age of Onset   Hypertension Mother    Cancer Maternal Grandmother        ovarian   Diabetes Paternal Grandfather    Coronary artery disease Father     Social History   Tobacco Use   Smoking status: Light Smoker    Packs/day: 0.00    Years: 26.00    Pack years: 0.00    Types: Cigarettes    Last attempt to quit: 04/05/2015    Years since quitting: 5.4   Smokeless tobacco: Never  Vaping Use   Vaping  Use: Never used  Substance Use Topics   Alcohol use: Yes    Alcohol/week: 0.0 standard drinks    Comment: 3 times per week   Drug use: Yes    Comment: heroin daily    Home Medications Prior to Admission medications   Medication Sig Start Date End Date Taking? Authorizing Provider  acetaminophen (TYLENOL) 325 MG tablet Take 2 tablets (650 mg total) by mouth every 6 (six) hours as needed for mild pain. 06/11/18   Mercy Riding, MD  alprazolam (XANAX) 2 MG tablet TAKE 1 TABLET BY MOUTH 3 TIMES DAILY AS NEEDED FOR ANXIETY Patient taking differently: Take 2 mg by mouth 3 (three) times daily as needed for anxiety. TAKE 1 TABLET BY MOUTH 3 TIMES DAILY AS NEEDED FOR ANXIETY 12/21/15   Weber, Damaris Hippo, PA-C  amphetamine-dextroamphetamine (ADDERALL) 20 MG tablet Take 20 mg by mouth 4 (four) times daily.  01/31/14   [provider]  cephALEXin (KEFLEX) 500 MG capsule Take 1 capsule (500 mg total) by mouth 2 (two) times daily. 08/18/19   Palumbo, April, MD  citalopram (CELEXA) 40 MG tablet Take 1 tablet (40 mg total) by mouth daily. 05/24/15   Leandrew Koyanagi, MD  clobetasol ointment (TEMOVATE) [id number] % Apply 1 application topically 2 (two) times daily as needed (red spots on face).    [provider]    Allergies    Codeine and Tylenol [acetaminophen]  Review of Systems   Review of Systems  Constitutional:  Negative for fever.  Musculoskeletal:  Positive for arthralgias.  Skin:  Positive for color change and wound.  All other systems reviewed and are negative.  Physical Exam Updated Vital Signs BP (!) 142/84   Pulse 77   Temp 98.5 F (36.9 C) (Oral)   Resp 17   Ht '5\' 10"'$  (1.778 m)   Wt 97.5 kg   SpO2 99%   BMI 30.85 kg/m   Physical Exam Vitals and nursing note reviewed.  Constitutional:      General: He is not in acute distress.    Appearance: Normal appearance. He is well-developed.  HENT:     Head: Normocephalic and atraumatic.     Right Ear: Hearing normal.      Left Ear: Hearing normal.     Nose: Nose normal.  Eyes:     Conjunctiva/sclera: Conjunctivae normal.     Pupils: Pupils are equal, round, and reactive to light.  Cardiovascular:     Rate and Rhythm: Regular rhythm.     Heart sounds: S1 normal and S2 normal. No murmur heard.   No friction rub. No gallop.  Pulmonary:     Effort: Pulmonary effort is normal. No respiratory distress.     Breath sounds: Normal breath sounds.  Chest:     Chest wall: No tenderness.  Abdominal:     General: Bowel sounds are normal.     Palpations: Abdomen is soft.     Tenderness: There is no abdominal tenderness. There is no guarding or rebound. Negative signs include Murphy's sign and McBurney's sign.     Hernia: No hernia is present.  Musculoskeletal:        General: Normal range of motion.     Cervical back: Normal range of motion and neck supple.  Skin:    General: Skin is warm and dry.     Findings: Wound (Multiple scabs with surrounding erythema, no induration or fluctuance, right lower leg) present. No rash.  Neurological:     Mental Status: He is alert and oriented to person, place, and time.     GCS: GCS eye subscore is 4. GCS verbal subscore is 5. GCS motor subscore is 6.     Cranial Nerves: No cranial nerve deficit.     Sensory: No sensory deficit.     Coordination: Coordination normal.  Psychiatric:        Speech: Speech normal.        Behavior: Behavior normal.        Thought Content: Thought content normal.    ED Results / Procedures / Treatments   Labs (all labs ordered are listed, but only abnormal results are displayed) Labs Reviewed  CBC WITH DIFFERENTIAL/PLATELET - Abnormal; Notable for the following components:      Result Value   RBC 4.15 (*)    Hemoglobin 11.4 (*)    HCT 34.8 (*)    All other components within normal limits  COMPREHENSIVE METABOLIC PANEL - Abnormal; Notable for the following components:   Glucose, Bld 104 (*)    Calcium 8.6 (*)    Albumin 3.4 (*)     AST 13 (*)    Total Bilirubin 0.2 (*)    All other components within  normal limits  SEDIMENTATION RATE - Abnormal; Notable for the following components:   Sed Rate 47 (*)    All other components within normal limits  RESP PANEL BY RT-PCR (FLU A&B, COVID) ARPGX2  CULTURE, BLOOD (ROUTINE X 2)  CULTURE, BLOOD (ROUTINE X 2)  LACTIC ACID, PLASMA  C-REACTIVE PROTEIN    EKG EKG Interpretation  Date/Time:  Saturday September 16 2020 01:16:14 EDT Ventricular Rate:  85 PR Interval:  126 QRS Duration: 94 QT Interval:  364 QTC Calculation: 433 R Axis:   58 Text Interpretation: Sinus rhythm Confirmed by Orpah Greek [phone]) on 09/16/2020 1:21:41 AM  Radiology DG Ankle Complete Left  Result Date: 09/16/2020 CLINICAL DATA:  Pain, swelling EXAM: LEFT ANKLE COMPLETE - 3+ VIEW COMPARISON:  None. FINDINGS: Diffuse soft tissue swelling. No acute bony abnormality. Specifically, no fracture, subluxation, or dislocation. Joint spaces maintained. IMPRESSION: No acute bony abnormality. Electronically Signed   By: Rolm Baptise M.D.   On: 09/16/2020 01:47   DG Ankle Complete Right  Result Date: 09/16/2020 CLINICAL DATA:  Pain, swelling EXAM: RIGHT ANKLE - COMPLETE 3+ VIEW COMPARISON:  None. FINDINGS: Diffuse soft tissue swelling. No acute bony abnormality. Specifically, no fracture, subluxation, or dislocation. Joint spaces maintained. IMPRESSION: No acute bony abnormality. Electronically Signed   By: Rolm Baptise M.D.   On: 09/16/2020 01:47   DG Chest Port 1 View  Result Date: 09/16/2020 CLINICAL DATA:  Bilateral ankle pain, swelling for 1 month EXAM: PORTABLE CHEST 1 VIEW COMPARISON:  07/19/2018 FINDINGS: The heart size and mediastinal contours are within normal limits. Both lungs are clear. The visualized skeletal structures are unremarkable. IMPRESSION: Normal study. Electronically Signed   By: Rolm Baptise M.D.   On: 09/16/2020 01:43    Procedures Procedures   Medications Ordered in  ED Medications  vancomycin (VANCOCIN) IVPB 1000 mg/200 mL premix (0 mg Intravenous Stopped 09/16/20 0555)    ED Course  I have reviewed the triage vital signs and the nursing notes.  Pertinent labs & imaging results that were available during my care of the patient were reviewed by me and considered in my medical decision making (see chart for details).    MDM Rules/Calculators/A&P                           Patient with previous history of IV drug abuse presents to the emergency department with complaints of multiple areas of soft tissue infection on his right leg as well as bilateral ankle swelling.  Afebrile at arrival.  Patient with multiple areas of folliculitis on the right leg.  With his history of IV drug abuse, bacteremia and endocarditis are in the differential diagnosis.  This leads to the possibility of septic arthritis in the ankles.  The natural history of this, however, is unusual because patient reports that the ankle swelling has progressed over a period of 2 months.  No significant erythema or warmth of the ankles.  Sed rate and C-reactive protein elevated.  Will admit for observation further management of acute soft tissue infection, rule out deeper infection. Final Clinical Impression(s) / ED Diagnoses Final diagnoses:  Cellulitis of right lower extremity    Rx / DC Orders ED Discharge Orders     None        Orpah Greek, MD 09/16/20 [phone]

## 2020-09-16 NOTE — Progress Notes (Signed)
Pharmacy Antibiotic Note  [name] is a 50 y.o. male admitted on 09/15/2020 with cellulitis.  Pharmacy has been consulted for vancomycin dosing.  WBC wnl, SCr wnl, AF   Plan: -Vancomycin 1000 mg IV Q 12 hrs. Goal AUC 400-550. Expected AUC: 527 SCr used: 1.06 -Monitor CBC, renal fx, cultures and clinical progress -Vanc levels as indicated    Height: '5\' 10"'$  (177.8 cm) Weight: 98.7 kg (217 lb 9.6 oz) IBW/kg (Calculated) : 73  Temp (24hrs), Avg:98.2 F (36.8 C), Min:97.8 F (36.6 C), Max:98.5 F (36.9 C)  Recent Labs  Lab 09/16/20 0112  WBC 6.7  CREATININE 1.06  LATICACIDVEN 1.0    Estimated Creatinine Clearance: 100.4 mL/min (by C-G formula based on SCr of 1.06 mg/dL).    Allergies  Allergen Reactions   Codeine Itching and Nausea Only   Tylenol [Acetaminophen] Other (See Comments)    headache    Antimicrobials this admission: Vancomycin 8/6 >>   Dose adjustments this admission:  Microbiology results: 8/6 BCx:    Thank you for allowing pharmacy to be a part of this patient's care.  Albertina Parr, PharmD., BCPS, BCCCP Clinical Pharmacist Please refer to [facility] for unit-specific pharmacist

## 2020-09-16 NOTE — ED Notes (Signed)
Patient has been crying due to excruciating back pain.  Ibuprofen given as ordered.  We will continue to monitor.

## 2020-09-17 DIAGNOSIS — B957 Other staphylococcus as the cause of diseases classified elsewhere: Secondary | ICD-10-CM

## 2020-09-17 DIAGNOSIS — R7881 Bacteremia: Secondary | ICD-10-CM

## 2020-09-17 DIAGNOSIS — F1990 Other psychoactive substance use, unspecified, uncomplicated: Secondary | ICD-10-CM

## 2020-09-17 DIAGNOSIS — Z1629 Resistance to other single specified antibiotic: Secondary | ICD-10-CM

## 2020-09-17 LAB — TSH: TSH: 5.859 u[IU]/mL — ABNORMAL HIGH (ref 0.350–4.500)

## 2020-09-17 LAB — BLOOD CULTURE ID PANEL (REFLEXED) - BCID2

## 2020-09-17 LAB — BASIC METABOLIC PANEL
Anion gap: 8 (ref 5–15)
BUN: 15 mg/dL (ref 6–20)
CO2: 27 mmol/L (ref 22–32)
Calcium: 8.9 mg/dL (ref 8.9–10.3)
Chloride: 101 mmol/L (ref 98–111)
Creatinine, Ser: 1.09 mg/dL (ref 0.61–1.24)
GFR, Estimated: >60 mL/min (ref >60)
Glucose, Bld: 97 mg/dL (ref 70–99)
Potassium: 4 mmol/L (ref 3.5–5.1)
Sodium: 136 mmol/L (ref 135–145)

## 2020-09-17 LAB — CBC
HCT: 36.8 % — ABNORMAL LOW (ref 39.0–52.0)
Hemoglobin: 12.1 g/dL — ABNORMAL LOW (ref 13.0–17.0)
MCH: 27.4 pg (ref 26.0–34.0)
MCHC: 32.9 g/dL (ref 30.0–36.0)
MCV: 83.3 fL (ref 80.0–100.0)
Platelets: 301 10*3/uL (ref 150–400)
RBC: 4.42 MIL/uL (ref 4.22–5.81)
RDW: 14.7 % (ref 11.5–15.5)
WBC: 5.3 10*3/uL (ref 4.0–10.5)
nRBC: 0 % (ref 0.0–0.2)

## 2020-09-17 LAB — BRAIN NATRIURETIC PEPTIDE: B Natriuretic Peptide: 27.5 pg/mL (ref 0.0–100.0)

## 2020-09-17 LAB — T4, FREE: Free T4: 0.79 ng/dL (ref 0.61–1.12)

## 2020-09-17 LAB — HEPATITIS PANEL, ACUTE
HCV Ab: NONREACTIVE
Hep A IgM: NONREACTIVE
Hep B C IgM: NONREACTIVE
Hepatitis B Surface Ag: NONREACTIVE

## 2020-09-17 LAB — HIV ANTIBODY (ROUTINE TESTING W REFLEX): HIV Screen 4th Generation wRfx: NONREACTIVE

## 2020-09-17 LAB — PREALBUMIN: Prealbumin: 17.6 mg/dL — ABNORMAL LOW (ref 18–38)

## 2020-09-17 MED ORDER — BUPRENORPHINE HCL-NALOXONE HCL 8-2 MG SL SUBL
1.0000 | SUBLINGUAL_TABLET | Freq: Every day | SUBLINGUAL | Status: DC
Start: 2020-09-17 — End: 2020-09-20
  Administered 2020-09-18 – 2020-09-20 (×3): 1 via SUBLINGUAL
  Filled 2020-09-17 (×4): qty 1

## 2020-09-17 MED ORDER — SODIUM CHLORIDE 0.9 % IV SOLN
INTRAVENOUS | Status: DC | PRN
  Administered 2020-09-17 – 2020-09-18 (×2): 250 mL via INTRAVENOUS

## 2020-09-17 NOTE — Progress Notes (Signed)
PT Cancellation Note  Patient Details Name: [name] MRN: [id number] DOB: 08-01-1970   Cancelled Treatment:    Reason Eval/Treat Not Completed: Other (comment) still awaiting results of lumbar CT in context of severe back pain prior to initiating PT eval- will continue to follow.   Windell Norfolk, DPT, PN2   Supplemental Physical Therapist [place]    Pager [phone] Acute Rehab Office [phone]

## 2020-09-17 NOTE — Progress Notes (Signed)
Spoke to rounding M.D about the patient,wanting to go home and his refusal to have anti-anxiety medication.New medicine prescribed to patient.Nurse offered new med to patient,patient's facial reaction brighten up and initially said '' I want to take it later on,giving me that too early will going to precipitate my withdrawal. Nurse asked patient when was the last time he had taken this kind of medicine.Patient responded on irritating voice and said '' two days ago and you don't know how this medicine works,so hand to me that medicine  and  I ll take it later on" said ,no we are no doing that thing ,handing this medicine to you,If you are ready for this medicine ,let me know and call me.With that patient lie back to his bed ,put a blanket to self and closed his eyes.Staffs left the room .

## 2020-09-17 NOTE — Plan of Care (Signed)
  Problem: Clinical Measurements: Goal: Respiratory complications will improve Outcome: Adequate for Discharge   

## 2020-09-17 NOTE — Progress Notes (Signed)
PHARMACY - PHYSICIAN COMMUNICATION CRITICAL VALUE ALERT - BLOOD CULTURE IDENTIFICATION (BCID)  [name] is an 50 y.o. male who presented to [facility] on 09/15/2020 with a chief complaint of leg and ankle swelling.   Assessment:  Gram positive cocci in clusters in 4/4 bottles.  BCID showed staphylococcus epidermidis with MecAC resistance detected  Name of physician (or Provider) Contacted: Dr. Wynelle Cleveland  Current antibiotics: Vancomycin '1000mg'$  IV q12 hours  Changes to prescribed antibiotics recommended:  Patient is on recommended antibiotics - No changes needed  Results for orders placed or performed during the hospital encounter of 09/15/20  Blood Culture ID Panel (Reflexed) (Collected: 09/16/2020  1:17 AM)  Result Value Ref Range   Enterococcus faecalis NOT DETECTED NOT DETECTED   Enterococcus Faecium NOT DETECTED NOT DETECTED   Listeria monocytogenes NOT DETECTED NOT DETECTED   Staphylococcus species DETECTED (A) NOT DETECTED   Staphylococcus aureus (BCID) NOT DETECTED NOT DETECTED   Staphylococcus epidermidis DETECTED (A) NOT DETECTED   Staphylococcus lugdunensis NOT DETECTED NOT DETECTED   Streptococcus species NOT DETECTED NOT DETECTED   Streptococcus agalactiae NOT DETECTED NOT DETECTED   Streptococcus pneumoniae NOT DETECTED NOT DETECTED   Streptococcus pyogenes NOT DETECTED NOT DETECTED   A.calcoaceticus-baumannii NOT DETECTED NOT DETECTED   Bacteroides fragilis NOT DETECTED NOT DETECTED   Enterobacterales NOT DETECTED NOT DETECTED   Enterobacter cloacae complex NOT DETECTED NOT DETECTED   Escherichia coli NOT DETECTED NOT DETECTED   Klebsiella aerogenes NOT DETECTED NOT DETECTED   Klebsiella oxytoca NOT DETECTED NOT DETECTED   Klebsiella pneumoniae NOT DETECTED NOT DETECTED   Proteus species NOT DETECTED NOT DETECTED   Salmonella species NOT DETECTED NOT DETECTED   Serratia marcescens NOT DETECTED NOT DETECTED   Haemophilus influenzae NOT DETECTED NOT DETECTED    Neisseria meningitidis NOT DETECTED NOT DETECTED   Pseudomonas aeruginosa NOT DETECTED NOT DETECTED   Stenotrophomonas maltophilia NOT DETECTED NOT DETECTED   Candida albicans NOT DETECTED NOT DETECTED   Candida auris NOT DETECTED NOT DETECTED   Candida glabrata NOT DETECTED NOT DETECTED   Candida krusei NOT DETECTED NOT DETECTED   Candida parapsilosis NOT DETECTED NOT DETECTED   Candida tropicalis NOT DETECTED NOT DETECTED   Cryptococcus neoformans/gattii NOT DETECTED NOT DETECTED   Methicillin resistance mecA/C DETECTED (A) NOT DETECTED    Dimple Nanas, PharmD 09/17/2020 7:22 AM

## 2020-09-17 NOTE — Progress Notes (Signed)
Called into patient's room for  IV pump is beeping,Patient verbalized that he is getting better and wants to go home.Nurse noticed that patient is becoming irrational,rattled as the nurse explained what the reason he is in here for and told him the the rounding M.D is on the way.Offered anti -anxiety medicine but patient refused as witnessed by other staff member.

## 2020-09-17 NOTE — Plan of Care (Signed)
  Problem: Activity: Goal: Risk for activity intolerance will decrease Outcome: Progressing   

## 2020-09-17 NOTE — Progress Notes (Signed)
PROGRESS NOTE    [name]   CVU:[id number]  DOB: [birth year]  DOA: 09/15/2020 PCP: Patient, No Pcp Per (Inactive)   Brief Narrative:  [name] 50 y/o with HTN, IV drug abuse, GAD, Chronic back pain, ADD, Nicotine abuse, subclinical hypothyroidism who comes to the ED for b/l ankle pain and swelling progressing over 1 month. He was sent from UC. He denied IV drug use in the ED despite having a number of skin abrasions/ lesions in various stages of healing and folliculitis. He also admitted to severe lower back pain. Cellulitis of the right knee was noted. Septic arthritis, dsicitis and endocarditis were suspected in the ED and he was admitted for IV Antibiotics. Started on Vancomycin.  Xrays of b/l ankles unrevealing Xray of right knee revealed soft tissue edema CT L spine negative.  UDS ordered but not done.  Given 1 dose of LV Lasix for pedal edema. Started on Toradol, Xanax and PRN Hydralazine.   Admitted in 07/21/18 for multiple areas of cellulitis and abscesses.  Admitted 06/11/18 for an abscess Was being treated with Suboxone in 2016 by Dr Toy Care.   Subjective: Patient found to be restless and anxious. States he has to leave the hospital. Eventually admits to using Fentanyl and Meth "a lot".     Assessment & Plan:   Principal Problem:   Bacteremia due to methicillin resistant Staphylococcus epidermidis- diffuse folliculitis- lumbar pain IVDA - blood cultures 4/4 + for staph epi methicillin resistant - cont Vanc - will order 2 D ECHO - lumbar CT negative- back pain is likely his chronic pain - CRP 3.1, ESR 47 - follow on on folliculitis  Active Problems: IVDA, ADD - admits to Fentanyl and Methamphetamines abuse but did not admit to this in the ED - Suboxone started at 8 mg BID today for severe withdrawl    GAD (generalized anxiety disorder) - resume Xanax PRN - will discuss a different agent once his narcotic withdrawal improves    Chronic back  pain - cont Toradol    ADD (attention deficit disorder) - holding Adderal      Subclinical hypothyroidism - TSH is 5.858- check FT4     Time spent in minutes: 35 DVT prophylaxis: enoxaparin (LOVENOX) injection 40 mg Start: 09/16/20 1600  Code Status: Full code Family Communication:  Level of Care: Level of care: Med-Surg Disposition Plan:  Status is: Inpatient  Remains inpatient appropriate because:IV treatments appropriate due to intensity of illness or inability to take PO  Dispo: The patient is from: Home              Anticipated d/c is to: Home              Patient currently is not medically stable to d/c.   Difficult to place patient No      Consultants:  ID Procedures:  none Antimicrobials:  Anti-infectives (From admission, onward)    Start     Dose/Rate Route Frequency Ordered Stop   09/16/20 1600  vancomycin (VANCOCIN) IVPB 1000 mg/200 mL premix        1,000 mg 200 mL/hr over 60 Minutes Intravenous Every 12 hours 09/16/20 1535     09/16/20 0445  vancomycin (VANCOCIN) IVPB 1000 mg/200 mL premix        1,000 mg 200 mL/hr over 60 Minutes Intravenous  Once 09/16/20 0430 09/16/20 0555        Objective: Vitals:   09/16/20 1411 09/16/20 2107 09/17/20 0541 09/17/20 5797  BP:  (!) 149/93 (!) 141/80 (!) 155/84  Pulse:  91 81 85  Resp:  '18 18 20  ' Temp:  98.4 F (36.9 C) 98.3 F (36.8 C) 97.8 F (36.6 C)  TempSrc:    Oral  SpO2:  100% 98% 98%  Weight: 98.7 kg 98.7 kg    Height:        Intake/Output Summary (Last 24 hours) at 09/17/2020 1540 Last data filed at 09/17/2020 0800 Gross per 24 hour  Intake 474.98 ml  Output 0 ml  Net 474.98 ml   Filed Weights   09/15/20 2020 09/16/20 1411 09/16/20 2107  Weight: 97.5 kg 98.7 kg 98.7 kg    Examination: General exam: Appears comfortable  HEENT: PERRLA, oral mucosa moist, no sclera icterus or thrush Respiratory system: Clear to auscultation. Respiratory effort normal. Cardiovascular system: S1 & S2  heard, RRR.   Gastrointestinal system: Abdomen soft, non-tender, nondistended. Normal bowel sounds. Central nervous system: Alert and oriented. No focal neurological deficits. Extremities: No cyanosis, clubbing or edema Skin: No rashes or ulcers Psychiatry:  Mood & affect appropriate.     Data Reviewed: I have personally reviewed following labs and imaging studies  CBC: Recent Labs  Lab 09/16/20 0112 09/17/20 0747  WBC 6.7 5.3  NEUTROABS 4.5  --   HGB 11.4* 12.1*  HCT 34.8* 36.8*  MCV 83.9 83.3  PLT 279 540   Basic Metabolic Panel: Recent Labs  Lab 09/16/20 0112 09/17/20 0747  NA 137 136  K 3.8 4.0  CL 101 101  CO2 28 27  GLUCOSE 104* 97  BUN 18 15  CREATININE 1.06 1.09  CALCIUM 8.6* 8.9   GFR: Estimated Creatinine Clearance: 97.7 mL/min (by C-G formula based on SCr of 1.09 mg/dL). Liver Function Tests: Recent Labs  Lab 09/16/20 0112  AST 13*  ALT 13  ALKPHOS 85  BILITOT 0.2*  PROT 6.9  ALBUMIN 3.4*   No results for input(s): LIPASE, AMYLASE in the last 168 hours. No results for input(s): AMMONIA in the last 168 hours. Coagulation Profile: No results for input(s): INR, PROTIME in the last 168 hours. Cardiac Enzymes: No results for input(s): CKTOTAL, CKMB, CKMBINDEX, TROPONINI in the last 168 hours. BNP (last 3 results) No results for input(s): PROBNP in the last 8760 hours. HbA1C: No results for input(s): HGBA1C in the last 72 hours. CBG: No results for input(s): GLUCAP in the last 168 hours. Lipid Profile: No results for input(s): CHOL, HDL, LDLCALC, TRIG, CHOLHDL, LDLDIRECT in the last 72 hours. Thyroid Function Tests: Recent Labs    09/17/20 0747  TSH 5.859*   Anemia Panel: No results for input(s): VITAMINB12, FOLATE, FERRITIN, TIBC, IRON, RETICCTPCT in the last 72 hours. Urine analysis:    Component Value Date/Time   COLORURINE YELLOW 08/18/2019 0030   APPEARANCEUR CLEAR 08/18/2019 0030   LABSPEC 1.025 08/18/2019 0030   PHURINE 5.0  08/18/2019 0030   GLUCOSEU NEGATIVE 08/18/2019 0030   HGBUR NEGATIVE 08/18/2019 0030   BILIRUBINUR NEGATIVE 08/18/2019 0030   KETONESUR NEGATIVE 08/18/2019 0030   PROTEINUR NEGATIVE 08/18/2019 0030   UROBILINOGEN 0.2 12/01/2008 0412   NITRITE NEGATIVE 08/18/2019 0030   LEUKOCYTESUR TRACE (A) 08/18/2019 0030   Sepsis Labs: '@LABRCNTIP' (procalcitonin:4,lacticidven:4) ) Recent Results (from the past 240 hour(s))  Culture, blood (Routine X 2) w Reflex to ID Panel     Status: None (Preliminary result)   Collection Time: 09/16/20  1:12 AM   Specimen: BLOOD LEFT FOREARM  Result Value Ref Range Status   Specimen  Description   Final    BLOOD LEFT FOREARM Performed at [facility], [place] [address], [place], [place] [address]    Special Requests   Final    BOTTLES DRAWN AEROBIC AND ANAEROBIC Blood Culture adequate volume Performed at [facility], [address] [place], [place] [address]    Culture  Setup Time   Final    GRAM POSITIVE COCCI IN CLUSTERS IN BOTH AEROBIC AND ANAEROBIC BOTTLES CRITICAL VALUE NOTED.  VALUE IS CONSISTENT WITH PREVIOUSLY REPORTED AND CALLED VALUE. Performed at [facility], [place] [address] [place], [place] [address]    Culture GRAM POSITIVE COCCI  Final   Report Status PENDING  Incomplete  Culture, blood (Routine X 2) w Reflex to ID Panel     Status: None (Preliminary result)   Collection Time: 09/16/20  1:17 AM   Specimen: BLOOD  Result Value Ref Range Status   Specimen Description   Final    BLOOD RIGHT ANTECUBITAL Performed at [facility], [address] [place], [place] [address]    Special Requests   Final    BOTTLES DRAWN AEROBIC AND ANAEROBIC Blood Culture adequate volume Performed at [facility], [address] [place], [place] [address]    Culture  Setup Time   Final    GRAM POSITIVE COCCI IN CLUSTERS IN BOTH AEROBIC AND ANAEROBIC BOTTLES CRITICAL RESULT CALLED TO, READ  BACK BY AND VERIFIED WITH: Trude Mcburney [id number] 09/17/2020 Mena Goes Performed at [facility], [place] [address], [place],  [address]    Culture GRAM POSITIVE COCCI  Final   Report Status PENDING  Incomplete  Blood Culture ID Panel (Reflexed)     Status: Abnormal   Collection Time: 09/16/20  1:17 AM  Result Value Ref Range Status   Enterococcus faecalis NOT DETECTED NOT DETECTED Final   Enterococcus Faecium NOT DETECTED NOT DETECTED Final   Listeria monocytogenes NOT DETECTED NOT DETECTED Final   Staphylococcus species DETECTED (A) NOT DETECTED Final    Comment: CRITICAL RESULT CALLED TO, READ BACK BY AND VERIFIED WITH: M. Paul Dykes [id number] 09/17/2020 T. TYSOR    Staphylococcus aureus (BCID) NOT DETECTED NOT DETECTED Final   Staphylococcus epidermidis DETECTED (A) NOT DETECTED Final    Comment: Methicillin (oxacillin) resistant coagulase negative staphylococcus. Possible blood culture contaminant (unless isolated from more than one blood culture draw or clinical case suggests pathogenicity). No antibiotic treatment is indicated for blood  culture contaminants. CRITICAL RESULT CALLED TO, READ BACK BY AND VERIFIED WITH: Trude Mcburney [id number] 09/17/2020 T. TYSOR    Staphylococcus lugdunensis NOT DETECTED NOT DETECTED Final   Streptococcus species NOT DETECTED NOT DETECTED Final   Streptococcus agalactiae NOT DETECTED NOT DETECTED Final   Streptococcus pneumoniae NOT DETECTED NOT DETECTED Final   Streptococcus pyogenes NOT DETECTED NOT DETECTED Final   A.calcoaceticus-baumannii NOT DETECTED NOT DETECTED Final   Bacteroides fragilis NOT DETECTED NOT DETECTED Final   Enterobacterales NOT DETECTED NOT DETECTED Final   Enterobacter cloacae complex NOT DETECTED NOT DETECTED Final   Escherichia coli NOT DETECTED NOT DETECTED Final   Klebsiella aerogenes NOT DETECTED NOT DETECTED Final   Klebsiella oxytoca NOT DETECTED NOT DETECTED Final   Klebsiella pneumoniae NOT DETECTED NOT  DETECTED Final   Proteus species NOT DETECTED NOT DETECTED Final   Salmonella species NOT DETECTED NOT DETECTED Final   Serratia marcescens NOT DETECTED NOT DETECTED Final   Haemophilus influenzae NOT DETECTED NOT DETECTED Final   Neisseria meningitidis NOT  DETECTED NOT DETECTED Final   Pseudomonas aeruginosa NOT DETECTED NOT DETECTED Final   Stenotrophomonas maltophilia NOT DETECTED NOT DETECTED Final   Candida albicans NOT DETECTED NOT DETECTED Final   Candida auris NOT DETECTED NOT DETECTED Final   Candida glabrata NOT DETECTED NOT DETECTED Final   Candida krusei NOT DETECTED NOT DETECTED Final   Candida parapsilosis NOT DETECTED NOT DETECTED Final   Candida tropicalis NOT DETECTED NOT DETECTED Final   Cryptococcus neoformans/gattii NOT DETECTED NOT DETECTED Final   Methicillin resistance mecA/C DETECTED (A) NOT DETECTED Final    Comment: CRITICAL RESULT CALLED TO, READ BACK BY AND VERIFIED WITH: Trude Mcburney [id number] 09/17/2020 Mena Goes Performed at [place] [address] [place], [place] [address]   Resp Panel by RT-PCR (Flu A&B, Covid) Nasopharyngeal Swab     Status: None   Collection Time: 09/16/20  4:31 AM   Specimen: Nasopharyngeal Swab; Nasopharyngeal(NP) swabs in vial transport medium  Result Value Ref Range Status   SARS Coronavirus 2 by RT PCR NEGATIVE NEGATIVE Final    Comment: (NOTE) SARS-CoV-2 target nucleic acids are NOT DETECTED.  The SARS-CoV-2 RNA is generally detectable in upper respiratory specimens during the acute phase of infection. The lowest concentration of SARS-CoV-2 viral copies this assay can detect is 138 copies/mL. A negative result does not preclude SARS-Cov-2 infection and should not be used as the sole basis for treatment or other patient management decisions. A negative result may occur with  improper specimen collection/handling, submission of specimen other than nasopharyngeal swab, presence of viral mutation(s) within the areas  targeted by this assay, and inadequate number of viral copies(<138 copies/mL). A negative result must be combined with clinical observations, patient history, and epidemiological information. The expected result is Negative.  Fact Sheet for Patients:  EntrepreneurPulse.com.au  Fact Sheet for Healthcare Providers:  IncredibleEmployment.be  This test is no t yet approved or cleared by the [place] FDA and  has been authorized for detection and/or diagnosis of SARS-CoV-2 by FDA under an Emergency Use Authorization (EUA). This EUA will remain  in effect (meaning this test can be used) for the duration of the COVID-19 declaration under Section 564(b)(1) of the Act, 21 U.S.C.section 360bbb-3(b)(1), unless the authorization is terminated  or revoked sooner.       Influenza A by PCR NEGATIVE NEGATIVE Final   Influenza B by PCR NEGATIVE NEGATIVE Final    Comment: (NOTE) The Xpert Xpress SARS-CoV-2/FLU/RSV plus assay is intended as an aid in the diagnosis of influenza from Nasopharyngeal swab specimens and should not be used as a sole basis for treatment. Nasal washings and aspirates are unacceptable for Xpert Xpress SARS-CoV-2/FLU/RSV testing.  Fact Sheet for Patients: EntrepreneurPulse.com.au  Fact Sheet for Healthcare Providers: IncredibleEmployment.be  This test is not yet approved or cleared by the [place] FDA and has been authorized for detection and/or diagnosis of SARS-CoV-2 by FDA under an Emergency Use Authorization (EUA). This EUA will remain in effect (meaning this test can be used) for the duration of the COVID-19 declaration under Section 564(b)(1) of the Act, 21 U.S.C. section 360bbb-3(b)(1), unless the authorization is terminated or revoked.  Performed at [facility], [address], [place], [place] [address]          Radiology Studies: DG Knee 1-2 Views  Right  Result Date: 09/16/2020 CLINICAL DATA:  Cellulitis EXAM: RIGHT KNEE - 1-2 VIEW COMPARISON:  None. FINDINGS: No evidence of fracture, dislocation, or joint effusion. No evidence of arthropathy  or other focal bone abnormality. Soft tissue edema anteriorly. IMPRESSION: No fracture or dislocation of the right knee. Joint spaces are well preserved. Soft tissue edema anteriorly. Electronically Signed   By: Eddie Candle M.D.   On: 09/16/2020 19:36   DG Ankle Complete Left  Result Date: 09/16/2020 CLINICAL DATA:  Pain, swelling EXAM: LEFT ANKLE COMPLETE - 3+ VIEW COMPARISON:  None. FINDINGS: Diffuse soft tissue swelling. No acute bony abnormality. Specifically, no fracture, subluxation, or dislocation. Joint spaces maintained. IMPRESSION: No acute bony abnormality. Electronically Signed   By: Rolm Baptise M.D.   On: 09/16/2020 01:47   DG Ankle Complete Right  Result Date: 09/16/2020 CLINICAL DATA:  Pain, swelling EXAM: RIGHT ANKLE - COMPLETE 3+ VIEW COMPARISON:  None. FINDINGS: Diffuse soft tissue swelling. No acute bony abnormality. Specifically, no fracture, subluxation, or dislocation. Joint spaces maintained. IMPRESSION: No acute bony abnormality. Electronically Signed   By: Rolm Baptise M.D.   On: 09/16/2020 01:47   CT LUMBAR SPINE W CONTRAST  Result Date: 09/17/2020 CLINICAL DATA:  Low back pain, infection suspected. EXAM: CT LUMBAR SPINE WITH CONTRAST TECHNIQUE: Multidetector CT imaging of the lumbar spine was performed with intravenous contrast administration. CONTRAST:  119m OMNIPAQUE IOHEXOL 300 MG/ML  SOLN COMPARISON:  CT abdomen and pelvis 08/17/2019 FINDINGS: Segmentation: 5 lumbar type vertebrae. Alignment: Slight lumbar dextroscoliosis.  No significant listhesis. Vertebrae: No acute fracture or suspicious osseous lesion. No endplate erosion to suggest discitis-osteomyelitis. Vacuum disc at L1-2, L4-5, and L5-S1. Paraspinal and other soft tissues: Status post cholecystectomy. Asymmetric left  renal atrophy with multiple small nonobstructing left renal calculi. No paraspinal fluid collection. Disc levels: T12-L1: Mild spondylosis without stenosis. L1-2: Mild-to-moderate disc space narrowing. Disc bulging and mild facet hypertrophy result in likely mild left lateral recess and left neural foraminal stenosis without significant spinal stenosis. L2-3: Mild disc space narrowing. Circumferential disc bulging, a broad central disc protrusion, and mild facet hypertrophy result in mild spinal stenosis and left greater than right lateral recess stenosis without significant neural foraminal stenosis. L3-4: Mild disc space narrowing. Circumferential disc bulging and mild facet hypertrophy result in mild-to-moderate spinal stenosis, mild bilateral lateral recess stenosis, and mild bilateral neural foraminal stenosis. L4-5: Moderate disc space narrowing. Circumferential disc bulging, endplate spurring, and mild facet hypertrophy result in mild to moderate spinal stenosis, likely moderate bilateral lateral recess stenosis, and moderate bilateral neural foraminal stenosis. L5-S1: Mild disc space narrowing. Disc bulging and mild facet hypertrophy result in mild right greater than left neural foraminal stenosis without spinal stenosis. IMPRESSION: 1. No acute osseous abnormality or evidence of infection in the lumbar spine. 2. Lumbar disc and facet degeneration with mild-to-moderate spinal stenosis at L3-4 and L4-5. 3. Moderate bilateral neural foraminal stenosis at L4-5. 4. Nonobstructing left nephrolithiasis. Electronically Signed   By: ALogan BoresM.D.   On: 09/17/2020 12:16   DG Chest Port 1 View  Result Date: 09/16/2020 CLINICAL DATA:  Bilateral ankle pain, swelling for 1 month EXAM: PORTABLE CHEST 1 VIEW COMPARISON:  07/19/2018 FINDINGS: The heart size and mediastinal contours are within normal limits. Both lungs are clear. The visualized skeletal structures are unremarkable. IMPRESSION: Normal study.  Electronically Signed   By: KRolm BaptiseM.D.   On: 09/16/2020 01:43      Scheduled Meds:  buprenorphine-naloxone  1 tablet Sublingual Daily   enoxaparin (LOVENOX) injection  40 mg Subcutaneous Q24H   sodium chloride flush  3 mL Intravenous Q12H   Continuous Infusions:  sodium chloride 250 mL (09/17/20 0505)  vancomycin Stopped (09/17/20 0721)     LOS: 1 day      Debbe Odea, MD [facility] Pager: [email or web address] 09/17/2020, 3:40 PM

## 2020-09-18 ENCOUNTER — Inpatient Hospital Stay (HOSPITAL_COMMUNITY): Payer: 59

## 2020-09-18 DIAGNOSIS — R7881 Bacteremia: Secondary | ICD-10-CM | POA: Diagnosis not present

## 2020-09-18 LAB — ECHOCARDIOGRAM COMPLETE
Area-P 1/2: 2.63 cm2
Height: 70 in
S' Lateral: 3.4 cm
Weight: 3481.61 oz

## 2020-09-18 MED ORDER — SODIUM CHLORIDE 0.9 % IV SOLN: INTRAVENOUS | Status: DC

## 2020-09-18 MED ORDER — VANCOMYCIN HCL IN DEXTROSE 1-5 GM/200ML-% IV SOLN
1000.0000 mg | Freq: Once | INTRAVENOUS | Status: DC
  Filled 2020-09-18: qty 200

## 2020-09-18 NOTE — Progress Notes (Signed)
Response from Dr. Wynelle Cleveland. No midline at this time. Waiting for ID to assess patient tomorrow. Awaiting further order. Fran Lowes, RN VAST

## 2020-09-18 NOTE — Progress Notes (Signed)
  Echocardiogram 2D Echocardiogram has been performed.  [name] 09/18/2020, 10:54 AM

## 2020-09-18 NOTE — Evaluation (Signed)
Physical Therapy Evaluation Patient Details Name: [name] MRN: [id number] DOB: 1970-07-07 Today's Date: 09/18/2020   History of Present Illness  [name] is a 50 y.o. male presents with complaints of leg swelling, difficulty walking, and new erythema R knee.  with medical history significant of HTN, remote IV drug abuse, anxiety, and subclinical hypothyroidism  Clinical Impression   Patient evaluated by Physical Therapy with no further acute PT needs identified. All education has been completed and the patient has no further questions. Independent with mobility and walking; See below for any follow-up Physical Therapy or equipment needs. PT is signing off. Thank you for this referral.     Follow Up Recommendations Outpatient PT;Other (comment) (for back pain)    Equipment Recommendations  None recommended by PT    Recommendations for Other Services       Precautions / Restrictions Precautions Precautions: None      Mobility  Bed Mobility Overal bed mobility: Independent                  Transfers Overall transfer level: Independent                  Ambulation/Gait Ambulation/Gait assistance: Independent Gait Distance (Feet): 500 Feet   Gait Pattern/deviations: WFL(Within Functional Limits)     General Gait Details: walking very fast  Stairs Stairs: Yes Stairs assistance: Independent Stair Management: One rail Right;Forwards Number of Stairs: 12 General stair comments: No difficulties, including skipping steps  Wheelchair Mobility    Modified Rankin (Stroke Patients Only)       Balance Overall balance assessment: Independent                                           Pertinent Vitals/Pain Pain Assessment: No/denies pain    Home Living Family/patient expects to be discharged to:: Private residence Living Arrangements: Alone;Parent Available Help at Discharge: Family;Friend(s) Type of Home:  House Home Access: Stairs to enter   [employer] of Steps:  ("a few")          Prior Function Level of Independence: Independent         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     Hand Dominance        Extremity/Trunk Assessment   Upper Extremity Assessment Upper Extremity Assessment: Overall WFL for tasks assessed    Lower Extremity Assessment Lower Extremity Assessment: Overall WFL for tasks assessed    Cervical / Trunk Assessment Cervical / Trunk Assessment: Other exceptions Cervical / Trunk Exceptions: Reports back pain in the mornings that can be unbearable  Communication   Communication: No difficulties  Cognition Arousal/Alertness: Awake/alert Behavior During Therapy: WFL for tasks assessed/performed Overall Cognitive Status: Within Functional Limits for tasks assessed                                        General Comments General comments (skin integrity, edema, etc.): Demonstrated gentle low back AROM therex including seated pelvic rocking, and seated gentle twists -- these are options to perform sitting EOB when first getting up in the morning to warm up the low back and help with pain    Exercises     Assessment/Plan    PT Assessment All further PT needs can be met in the  next venue of care  PT Problem List Pain       PT Treatment Interventions      PT Goals (Current goals can be found in the Care Plan section)  Acute Rehab PT Goals Patient Stated Goal: less back pain PT Goal Formulation: All assessment and education complete, DC therapy    Frequency     Barriers to discharge        Co-evaluation               AM-PAC PT "6 Clicks" Mobility  Outcome Measure Help needed turning from your back to your side while in a flat bed without using bedrails?: None Help needed moving from lying on your back to sitting on the side of a flat bed without using bedrails?: None Help needed moving to and from a bed to a chair  (including a wheelchair)?: None Help needed standing up from a chair using your arms (e.g., wheelchair or bedside chair)?: None Help needed to walk in hospital room?: None Help needed climbing 3-5 steps with a railing? : None 6 Click Score: 24    End of Session   Activity Tolerance: Patient tolerated treatment well Patient left: in bed;with call bell/phone within reach Nurse Communication: Mobility status;Other (comment) (Pt wants to walk in the building independently) PT Visit Diagnosis: Pain Pain - part of body:  (Low back pain)    Time: 1798-1025 PT Time Calculation (min) (ACUTE ONLY): 23 min   Charges:   PT Evaluation $PT Eval Low Complexity: 1 Low PT Treatments $Gait Training: 8-22 mins        Roney Marion, PT  Acute Rehabilitation Services Pager [phone] Office [phone]   [place] 09/18/2020, 1:02 PM

## 2020-09-18 NOTE — Progress Notes (Signed)
PROGRESS NOTE    [name]   BFX:[id number]  DOB: 30-Jan-1971  DOA: 09/15/2020 PCP: Patient, No Pcp Per (Inactive)   Brief Narrative:  [name] 50 y/o with HTN, IV drug abuse, GAD, Chronic back pain, ADD, Nicotine abuse, subclinical hypothyroidism who comes to the ED for b/l ankle pain and swelling progressing over 1 month. He was sent from UC. He denied IV drug use in the ED despite having a number of skin abrasions/ lesions in various stages of healing and folliculitis. He also admitted to severe lower back pain. Cellulitis of the right knee was noted. Septic arthritis, dsicitis and endocarditis were suspected in the ED and he was admitted for IV Antibiotics. Started on Vancomycin.  Xrays of b/l ankles unrevealing Xray of right knee revealed soft tissue edema CT L spine negative.  UDS ordered but not done.  Given 1 dose of LV Lasix for pedal edema. Started on Toradol, Xanax and PRN Hydralazine.   Admitted in 07/21/18 for multiple areas of cellulitis and abscesses.  Admitted 06/11/18 for an abscess Was being treated with Suboxone in 2016 by [name].   Subjective: He states again that he would like to be discharged today and has told the RN that he will not have the ECHO done. Reminded that he is bacteremic and he needs IV antibiotics and an ECHO.     Assessment & Plan:   Principal Problem:   Bacteremia due to methicillin resistant Staphylococcus epidermidis- diffuse folliculitis- lumbar pain IVDA - blood cultures 4/4 + for staph epi methicillin resistant - multiple skin lesions in various stages of healing noted- no drainage noted in lesions - cont Vanc - lumbar CT negative-  - CRP 3.1, ESR 47 - 2 D ECHO today is negative for endocarditis - will contact Cardiology for a TEE today   Active Problems: IVDA, ADD - admits to Fentanyl and Methamphetamines abuse but did not admit to this in the ED - Suboxone started at 8 mg BID today for severe withdrawl     GAD (generalized anxiety disorder) - resumed Xanax PRN - will discuss a different agent once his narcotic withdrawal improves    Chronic back pain - cont Toradol for today- it appears to be helping    ADD (attention deficit disorder) - holding Adderal      Subclinical hypothyroidism - TSH is 5.858- check FT4     Time spent in minutes: 35 DVT prophylaxis: enoxaparin (LOVENOX) injection 40 mg Start: 09/16/20 1600  Code Status: Full code Family Communication:  Level of [name]: Level of [name]: Med-Surg Disposition Plan:  Status is: Inpatient  Remains inpatient appropriate because:IV treatments appropriate due to intensity of illness or inability to take PO  Dispo: The patient is from: Home              Anticipated d/c is to: Home              Patient currently is not medically stable to d/c.   Difficult to place patient No      Consultants:  ID Procedures:  none Antimicrobials:  Anti-infectives (From admission, onward)    Start     Dose/Rate Route Frequency Ordered Stop   09/16/20 1600  vancomycin (VANCOCIN) IVPB 1000 mg/200 mL premix        1,000 mg 200 mL/hr over 60 Minutes Intravenous Every 12 hours 09/16/20 1535     09/16/20 0445  vancomycin (VANCOCIN) IVPB 1000 mg/200 mL premix  1,000 mg 200 mL/hr over 60 Minutes Intravenous  Once 09/16/20 0430 09/16/20 0555        Objective: Vitals:   09/17/20 0909 09/17/20 2121 09/18/20 0523 09/18/20 0929  BP: (!) 155/84 (!) 145/88 123/71 (!) 134/95  Pulse: 85 (!) 108 75 61  Resp: '20 16 17 17  ' Temp: 97.8 F (36.6 C) 98.9 F (37.2 C) 97.6 F (36.4 C) 97.7 F (36.5 C)  TempSrc: Oral Oral  Oral  SpO2: 98% 100% 100% 99%  Weight:      Height:        Intake/Output Summary (Last 24 hours) at 09/18/2020 1543 Last data filed at 09/18/2020 1400 Gross per 24 hour  Intake 1090.64 ml  Output 0 ml  Net 1090.64 ml    Filed Weights   09/15/20 2020 09/16/20 1411 09/16/20 2107  Weight: 97.5 kg 98.7 kg 98.7 kg     Examination: General exam: Appears comfortable  HEENT: PERRLA, oral mucosa moist, no sclera icterus or thrush Respiratory system: Clear to auscultation. Respiratory effort normal. Cardiovascular system: S1 & S2 heard, regular rate and rhythm Gastrointestinal system: Abdomen soft, non-tender, nondistended. Normal bowel sounds   Central nervous system: Alert and oriented. No focal neurological deficits. Extremities: No cyanosis, clubbing or edema- no signs of septic arthritis Skin: skin lesions with scabbing noted Psychiatry:  angry about not being able to go home    Data Reviewed: I have personally reviewed following labs and imaging studies  CBC: Recent Labs  Lab 09/16/20 0112 09/17/20 0747  WBC 6.7 5.3  NEUTROABS 4.5  --   HGB 11.4* 12.1*  HCT 34.8* 36.8*  MCV 83.9 83.3  PLT 279 798    Basic Metabolic Panel: Recent Labs  Lab 09/16/20 0112 09/17/20 0747  NA 137 136  K 3.8 4.0  CL 101 101  CO2 28 27  GLUCOSE 104* 97  BUN 18 15  CREATININE 1.06 1.09  CALCIUM 8.6* 8.9    GFR: Estimated Creatinine Clearance: 97.7 mL/min (by C-G formula based on SCr of 1.09 mg/dL). Liver Function Tests: Recent Labs  Lab 09/16/20 0112  AST 13*  ALT 13  ALKPHOS 85  BILITOT 0.2*  PROT 6.9  ALBUMIN 3.4*    No results for input(s): LIPASE, AMYLASE in the last 168 hours. No results for input(s): AMMONIA in the last 168 hours. Coagulation Profile: No results for input(s): INR, PROTIME in the last 168 hours. Cardiac Enzymes: No results for input(s): CKTOTAL, CKMB, CKMBINDEX, TROPONINI in the last 168 hours. BNP (last 3 results) No results for input(s): PROBNP in the last 8760 hours. HbA1C: No results for input(s): HGBA1C in the last 72 hours. CBG: No results for input(s): GLUCAP in the last 168 hours. Lipid Profile: No results for input(s): CHOL, HDL, LDLCALC, TRIG, CHOLHDL, LDLDIRECT in the last 72 hours. Thyroid Function Tests: Recent Labs    09/17/20 0747  09/17/20 0800  TSH 5.859*  --   FREET4  --  0.79    Anemia Panel: No results for input(s): VITAMINB12, FOLATE, FERRITIN, TIBC, IRON, RETICCTPCT in the last 72 hours. Urine analysis:    Component Value Date/Time   COLORURINE YELLOW 08/18/2019 0030   APPEARANCEUR CLEAR 08/18/2019 0030   LABSPEC 1.025 08/18/2019 0030   PHURINE 5.0 08/18/2019 0030   GLUCOSEU NEGATIVE 08/18/2019 0030   HGBUR NEGATIVE 08/18/2019 0030   BILIRUBINUR NEGATIVE 08/18/2019 0030   KETONESUR NEGATIVE 08/18/2019 0030   PROTEINUR NEGATIVE 08/18/2019 0030   UROBILINOGEN 0.2 12/01/2008 0412   NITRITE NEGATIVE  08/18/2019 0030   LEUKOCYTESUR TRACE (A) 08/18/2019 0030   Sepsis Labs: '@LABRCNTIP' (procalcitonin:4,lacticidven:4) ) Recent Results (from the past 240 hour(s))  Culture, blood (Routine X 2) w Reflex to ID Panel     Status: Abnormal (Preliminary result)   Collection Time: 09/16/20  1:12 AM   Specimen: BLOOD LEFT FOREARM  Result Value Ref Range Status   Specimen Description   Final    BLOOD LEFT FOREARM Performed at [facility], [place] [address], [place], [place] [address]    Special Requests   Final    BOTTLES DRAWN AEROBIC AND ANAEROBIC Blood Culture adequate volume Performed at [facility], [address] [place], [place] [address]    Culture  Setup Time   Final    GRAM POSITIVE COCCI IN CLUSTERS IN BOTH AEROBIC AND ANAEROBIC BOTTLES CRITICAL VALUE NOTED.  VALUE IS CONSISTENT WITH PREVIOUSLY REPORTED AND CALLED VALUE.    Culture (A)  Final    STAPHYLOCOCCUS HOMINIS THE SIGNIFICANCE OF ISOLATING THIS ORGANISM FROM A SINGLE SET OF BLOOD CULTURES WHEN MULTIPLE SETS ARE DRAWN IS UNCERTAIN. PLEASE NOTIFY THE MICROBIOLOGY DEPARTMENT WITHIN ONE WEEK IF SPECIATION AND SENSITIVITIES ARE REQUIRED. Performed at [facility], [place] [address] [place], [place] [address]    Report Status PENDING  Incomplete  Culture, blood (Routine X 2) w Reflex to ID Panel     Status: Abnormal  (Preliminary result)   Collection Time: 09/16/20  1:17 AM   Specimen: BLOOD  Result Value Ref Range Status   Specimen Description   Final    BLOOD RIGHT ANTECUBITAL Performed at [facility], [address] [place], [place] [address]    Special Requests   Final    BOTTLES DRAWN AEROBIC AND ANAEROBIC Blood Culture adequate volume Performed at [facility], [address], [place], [place] [address]    Culture  Setup Time   Final    GRAM POSITIVE COCCI IN CLUSTERS IN BOTH AEROBIC AND ANAEROBIC BOTTLES CRITICAL RESULT CALLED TO, READ BACK BY AND VERIFIED WITH: Trude Mcburney [id number] 09/17/2020 Mena Goes Performed at [facility], [place] [address], [place], [place] [address]    Culture STAPHYLOCOCCUS EPIDERMIDIS (A)  Final   Report Status PENDING  Incomplete  Blood Culture ID Panel (Reflexed)     Status: Abnormal   Collection Time: 09/16/20  1:17 AM  Result Value Ref Range Status   Enterococcus faecalis NOT DETECTED NOT DETECTED Final   Enterococcus Faecium NOT DETECTED NOT DETECTED Final   Listeria monocytogenes NOT DETECTED NOT DETECTED Final   Staphylococcus species DETECTED (A) NOT DETECTED Final    Comment: CRITICAL RESULT CALLED TO, READ BACK BY AND VERIFIED WITH: M. Paul Dykes [id number] 09/17/2020 T. TYSOR    Staphylococcus aureus (BCID) NOT DETECTED NOT DETECTED Final   Staphylococcus epidermidis DETECTED (A) NOT DETECTED Final    Comment: Methicillin (oxacillin) resistant coagulase negative staphylococcus. Possible blood culture contaminant (unless isolated from more than one blood culture draw or clinical case suggests pathogenicity). No antibiotic treatment is indicated for blood  culture contaminants. CRITICAL RESULT CALLED TO, READ BACK BY AND VERIFIED WITH: Trude Mcburney [id number] 09/17/2020 T. TYSOR    Staphylococcus lugdunensis NOT DETECTED NOT DETECTED Final   Streptococcus species NOT DETECTED NOT DETECTED Final    Streptococcus agalactiae NOT DETECTED NOT DETECTED Final   Streptococcus pneumoniae NOT DETECTED NOT DETECTED Final   Streptococcus pyogenes NOT DETECTED NOT DETECTED Final   A.calcoaceticus-baumannii NOT DETECTED NOT DETECTED Final  Bacteroides fragilis NOT DETECTED NOT DETECTED Final   Enterobacterales NOT DETECTED NOT DETECTED Final   Enterobacter cloacae complex NOT DETECTED NOT DETECTED Final   Escherichia coli NOT DETECTED NOT DETECTED Final   Klebsiella aerogenes NOT DETECTED NOT DETECTED Final   Klebsiella oxytoca NOT DETECTED NOT DETECTED Final   Klebsiella pneumoniae NOT DETECTED NOT DETECTED Final   Proteus species NOT DETECTED NOT DETECTED Final   Salmonella species NOT DETECTED NOT DETECTED Final   Serratia marcescens NOT DETECTED NOT DETECTED Final   Haemophilus influenzae NOT DETECTED NOT DETECTED Final   Neisseria meningitidis NOT DETECTED NOT DETECTED Final   Pseudomonas aeruginosa NOT DETECTED NOT DETECTED Final   Stenotrophomonas maltophilia NOT DETECTED NOT DETECTED Final   Candida albicans NOT DETECTED NOT DETECTED Final   Candida auris NOT DETECTED NOT DETECTED Final   Candida glabrata NOT DETECTED NOT DETECTED Final   Candida krusei NOT DETECTED NOT DETECTED Final   Candida parapsilosis NOT DETECTED NOT DETECTED Final   Candida tropicalis NOT DETECTED NOT DETECTED Final   Cryptococcus neoformans/gattii NOT DETECTED NOT DETECTED Final   Methicillin resistance mecA/C DETECTED (A) NOT DETECTED Final    Comment: CRITICAL RESULT CALLED TO, READ BACK BY AND VERIFIED WITH: Trude Mcburney [id number] 09/17/2020 Mena Goes Performed at [facility], [address], [place], [place] [address]   Resp Panel by RT-PCR (Flu A&B, Covid) Nasopharyngeal Swab     Status: None   Collection Time: 09/16/20  4:31 AM   Specimen: Nasopharyngeal Swab; Nasopharyngeal(NP) swabs in vial transport medium  Result Value Ref Range Status   SARS Coronavirus 2 by RT PCR NEGATIVE NEGATIVE Final     Comment: (NOTE) SARS-CoV-2 target nucleic acids are NOT DETECTED.  The SARS-CoV-2 RNA is generally detectable in upper respiratory specimens during the acute phase of infection. The lowest concentration of SARS-CoV-2 viral copies this assay can detect is 138 copies/mL. A negative result does not preclude SARS-Cov-2 infection and should not be used as the sole basis for treatment or other patient management decisions. A negative result may occur with  improper specimen collection/handling, submission of specimen other than nasopharyngeal swab, presence of viral mutation(s) within the areas targeted by this assay, and inadequate number of viral copies(<138 copies/mL). A negative result must be combined with clinical observations, patient history, and epidemiological information. The expected result is Negative.  Fact Sheet for Patients:  EntrepreneurPulse.com.au  Fact Sheet for Healthcare Providers:  IncredibleEmployment.be  This test is no t yet approved or cleared by the [place] FDA and  has been authorized for detection and/or diagnosis of SARS-CoV-2 by FDA under an Emergency Use Authorization (EUA). This EUA will remain  in effect (meaning this test can be used) for the duration of the COVID-19 declaration under Section 564(b)(1) of the Act, 21 U.S.C.section 360bbb-3(b)(1), unless the authorization is terminated  or revoked sooner.       Influenza A by PCR NEGATIVE NEGATIVE Final   Influenza B by PCR NEGATIVE NEGATIVE Final    Comment: (NOTE) The Xpert Xpress SARS-CoV-2/FLU/RSV plus assay is intended as an aid in the diagnosis of influenza from Nasopharyngeal swab specimens and should not be used as a sole basis for treatment. Nasal washings and aspirates are unacceptable for Xpert Xpress SARS-CoV-2/FLU/RSV testing.  Fact Sheet for Patients: EntrepreneurPulse.com.au  Fact Sheet for Healthcare  Providers: IncredibleEmployment.be  This test is not yet approved or cleared by the [place] FDA and has been authorized for detection and/or diagnosis of SARS-CoV-2 by  FDA under an Emergency Use Authorization (EUA). This EUA will remain in effect (meaning this test can be used) for the duration of the COVID-19 declaration under Section 564(b)(1) of the Act, 21 U.S.C. section 360bbb-3(b)(1), unless the authorization is terminated or revoked.  Performed at [facility], [address], [place], [place] [address]          Radiology Studies: DG Knee 1-2 Views Right  Result Date: 09/16/2020 CLINICAL DATA:  Cellulitis EXAM: RIGHT KNEE - 1-2 VIEW COMPARISON:  None. FINDINGS: No evidence of fracture, dislocation, or joint effusion. No evidence of arthropathy or other focal bone abnormality. Soft tissue edema anteriorly. IMPRESSION: No fracture or dislocation of the right knee. Joint spaces are well preserved. Soft tissue edema anteriorly. Electronically Signed   By: Eddie Candle M.D.   On: 09/16/2020 19:36   CT LUMBAR SPINE W CONTRAST  Result Date: 09/17/2020 CLINICAL DATA:  Low back pain, infection suspected. EXAM: CT LUMBAR SPINE WITH CONTRAST TECHNIQUE: Multidetector CT imaging of the lumbar spine was performed with intravenous contrast administration. CONTRAST:  168m OMNIPAQUE IOHEXOL 300 MG/ML  SOLN COMPARISON:  CT abdomen and pelvis 08/17/2019 FINDINGS: Segmentation: 5 lumbar type vertebrae. Alignment: Slight lumbar dextroscoliosis.  No significant listhesis. Vertebrae: No acute fracture or suspicious osseous lesion. No endplate erosion to suggest discitis-osteomyelitis. Vacuum disc at L1-2, L4-5, and L5-S1. Paraspinal and other soft tissues: Status post cholecystectomy. Asymmetric left renal atrophy with multiple small nonobstructing left renal calculi. No paraspinal fluid collection. Disc levels: T12-L1: Mild spondylosis without stenosis. L1-2:  Mild-to-moderate disc space narrowing. Disc bulging and mild facet hypertrophy result in likely mild left lateral recess and left neural foraminal stenosis without significant spinal stenosis. L2-3: Mild disc space narrowing. Circumferential disc bulging, a broad central disc protrusion, and mild facet hypertrophy result in mild spinal stenosis and left greater than right lateral recess stenosis without significant neural foraminal stenosis. L3-4: Mild disc space narrowing. Circumferential disc bulging and mild facet hypertrophy result in mild-to-moderate spinal stenosis, mild bilateral lateral recess stenosis, and mild bilateral neural foraminal stenosis. L4-5: Moderate disc space narrowing. Circumferential disc bulging, endplate spurring, and mild facet hypertrophy result in mild to moderate spinal stenosis, likely moderate bilateral lateral recess stenosis, and moderate bilateral neural foraminal stenosis. L5-S1: Mild disc space narrowing. Disc bulging and mild facet hypertrophy result in mild right greater than left neural foraminal stenosis without spinal stenosis. IMPRESSION: 1. No acute osseous abnormality or evidence of infection in the lumbar spine. 2. Lumbar disc and facet degeneration with mild-to-moderate spinal stenosis at L3-4 and L4-5. 3. Moderate bilateral neural foraminal stenosis at L4-5. 4. Nonobstructing left nephrolithiasis. Electronically Signed   By: ALogan BoresM.D.   On: 09/17/2020 12:16   ECHOCARDIOGRAM COMPLETE  Result Date: 09/18/2020    ECHOCARDIOGRAM REPORT   Patient Name:   [name] Date of Exam: 09/18/2020 Medical Rec #:  [id number]           Height:       70.0 in Accession #:    [id number]          Weight:       217.6 lb Date of Birth:  110-15-1972          BSA:          2.164 m Patient Age:    89years             BP:           134/95  mmHg Patient Gender: M                    HR:           61 bpm. Exam Location:  Inpatient Procedure: 2D Echo, Cardiac Doppler and  Color Doppler Indications:    Bacteremia  History:        Patient has no prior history of Echocardiogram examinations.                 Signs/Symptoms:Bacteremia; Risk Factors:Hypertension and Current                 Smoker. GAD, IV drug abuse, LE edema, cellulitis, MRSA.  Sonographer:    Dustin Flock Referring Phys: [place]  1. Left ventricular ejection fraction, by estimation, is 55 to 60%. The left ventricle has normal function. The left ventricle has no regional wall motion abnormalities. Left ventricular diastolic parameters are consistent with Grade I diastolic dysfunction (impaired relaxation).  2. Right ventricular systolic function is normal. The right ventricular size is normal. Tricuspid regurgitation signal is inadequate for assessing PA pressure.  3. The mitral valve is normal in structure. Trivial mitral valve regurgitation. No evidence of mitral stenosis.  4. The aortic valve is tricuspid. Aortic valve regurgitation is not visualized. No aortic stenosis is present.  5. The inferior vena cava is normal in size with greater than 50% respiratory variability, suggesting right atrial pressure of 3 mmHg. FINDINGS  Left Ventricle: Left ventricular ejection fraction, by estimation, is 55 to 60%. The left ventricle has normal function. The left ventricle has no regional wall motion abnormalities. The left ventricular internal cavity size was normal in size. There is  no left ventricular hypertrophy. Left ventricular diastolic parameters are consistent with Grade I diastolic dysfunction (impaired relaxation). Right Ventricle: The right ventricular size is normal. Right ventricular systolic function is normal. Tricuspid regurgitation signal is inadequate for assessing PA pressure. The tricuspid regurgitant velocity is 2.05 m/s, and with an assumed right atrial  pressure of 3 mmHg, the estimated right ventricular systolic pressure is 73.7 mmHg. Left Atrium: Left atrial size was normal in  size. Right Atrium: Right atrial size was normal in size. Pericardium: There is no evidence of pericardial effusion. Mitral Valve: The mitral valve is normal in structure. Mild mitral annular calcification. Trivial mitral valve regurgitation. No evidence of mitral valve stenosis. Tricuspid Valve: The tricuspid valve is normal in structure. Tricuspid valve regurgitation is trivial. No evidence of tricuspid stenosis. Aortic Valve: The aortic valve is tricuspid. Aortic valve regurgitation is not visualized. No aortic stenosis is present. Pulmonic Valve: The pulmonic valve was normal in structure. Pulmonic valve regurgitation is not visualized. No evidence of pulmonic stenosis. Aorta: The aortic root is normal in size and structure. Venous: The inferior vena cava is normal in size with greater than 50% respiratory variability, suggesting right atrial pressure of 3 mmHg. IAS/Shunts: No atrial level shunt detected by color flow Doppler.  LEFT VENTRICLE PLAX 2D LVIDd:         4.30 cm  Diastology LVIDs:         3.40 cm  LV e' medial:    6.96 cm/s LV PW:         1.00 cm  LV E/e' medial:  9.7 LV IVS:        1.10 cm  LV e' lateral:   9.25 cm/s LVOT diam:     2.10 cm  LV E/e' lateral: 7.3 LV SV:  97 LV SV Index:   45 LVOT Area:     3.46 cm  RIGHT VENTRICLE RV Basal diam:  3.80 cm RV S prime:     10.80 cm/s TAPSE (M-mode): 1.9 cm LEFT ATRIUM             Index       RIGHT ATRIUM           Index LA diam:        3.60 cm 1.66 cm/m  RA Area:     14.40 cm LA Vol (A2C):   57.7 ml 26.67 ml/m RA Volume:   37.10 ml  17.15 ml/m LA Vol (A4C):   38.2 ml 17.66 ml/m LA Biplane Vol: 47.3 ml 21.86 ml/m  AORTIC VALVE LVOT Vmax:   113.00 cm/s LVOT Vmean:  82.300 cm/s LVOT VTI:    0.280 m  AORTA Ao Root diam: 3.30 cm MITRAL VALVE               TRICUSPID VALVE MV Area (PHT): 2.63 cm    TR Peak grad:   16.8 mmHg MV Decel Time: 288 msec    TR Vmax:        205.00 cm/s MV E velocity: 67.70 cm/s MV A velocity: 80.60 cm/s  SHUNTS MV E/A  ratio:  0.84        Systemic VTI:  0.28 m                            Systemic Diam: 2.10 cm Kirk Ruths MD Electronically signed by Kirk Ruths MD Signature Date/Time: 09/18/2020/1:16:09 PM    Final       Scheduled Meds:  buprenorphine-naloxone  1 tablet Sublingual Daily   enoxaparin (LOVENOX) injection  40 mg Subcutaneous Q24H   sodium chloride flush  3 mL Intravenous Q12H   Continuous Infusions:  sodium chloride 250 mL (09/18/20 0420)   vancomycin Stopped (09/18/20 0552)     LOS: 2 days      Debbe Odea, MD [facility] Pager: [email or web address] 09/18/2020, 3:43 PM

## 2020-09-18 NOTE — Progress Notes (Signed)
PT refused Lovenox shot and blood culture lab draw. RN attempted to educate and pt responded " I dont need all of this". RN will attempt again MD Rizwan page notified.

## 2020-09-18 NOTE — Plan of Care (Signed)
  Problem: Activity: Goal: Risk for activity intolerance will decrease Outcome: Progressing   

## 2020-09-18 NOTE — Progress Notes (Signed)
    [facility] has been requested to perform a transesophageal echocardiogram on 09/19/20 for bacteremia.  After careful review of history and examination, the risks and benefits of transesophageal echocardiogram have been explained including risks of esophageal damage, perforation (1:10,000 risk), bleeding, pharyngeal hematoma as well as other potential complications associated with conscious sedation including aspiration, arrhythmia, respiratory failure and death. Alternatives to treatment were discussed, questions were answered. Patient is willing to proceed.   TEE scheduled for 09/19/20 at 2pm with Dr. Stanford Breed.   Roby Lofts, PA-C 09/18/2020 4:11 PM

## 2020-09-18 NOTE — Progress Notes (Signed)
Secure chat sent to Dr. Wynelle Cleveland and Laverda Sorenson RN. Consult received to place a midline for vancomycin. Educated on use with vancomycin and recommended a PICC line may be best option for vascular access. Awaiting response. Fran Lowes RN VAST

## 2020-09-19 ENCOUNTER — Encounter (HOSPITAL_COMMUNITY): Payer: Self-pay | Admitting: Anesthesiology

## 2020-09-19 ENCOUNTER — Other Ambulatory Visit (HOSPITAL_COMMUNITY): Payer: 59

## 2020-09-19 ENCOUNTER — Encounter (HOSPITAL_COMMUNITY)
Admission: EM | Disposition: A | Discharge status: Home / Self Care | Payer: Self-pay | Source: Home / Self Care | Attending: Internal Medicine

## 2020-09-19 DIAGNOSIS — L03115 Cellulitis of right lower limb: Secondary | ICD-10-CM | POA: Diagnosis not present

## 2020-09-19 LAB — RAPID URINE DRUG SCREEN, HOSP PERFORMED
Amphetamines: POSITIVE — AB
Barbiturates: NOT DETECTED
Benzodiazepines: POSITIVE — AB
Cocaine: NOT DETECTED
Opiates: NOT DETECTED
Tetrahydrocannabinol: NOT DETECTED

## 2020-09-19 LAB — RPR: RPR Ser Ql: NONREACTIVE

## 2020-09-19 SURGERY — CANCELLED PROCEDURE

## 2020-09-19 MED ORDER — ACETAMINOPHEN 325 MG PO TABS: 650.0000 mg | ORAL_TABLET | Freq: Four times a day (QID) | ORAL | Status: DC | PRN

## 2020-09-19 NOTE — Progress Notes (Signed)
Room filled with cigarette smoke. Told patient not to smoke. " I found it in the window sill." Explained to patient that smoking is not permitted in the hospital. Cigarette butt in commode noted.

## 2020-09-19 NOTE — Progress Notes (Signed)
Patient up all night. Kept requesting for snacks.

## 2020-09-19 NOTE — Consult Note (Signed)
[facility]    Date of Admission:  09/15/2020     Reason for Consult: hx ivdu, cellulitis  Vs venous stasis dermatitis, bacteremia   Referring Provider: Wynelle Cleveland    Lines:  peripheral  Abx: 8/6-c vanc        Assessment: 50 yo male with bilateral LE edema, distant ivdu, admitted for pain in the legs, with bcx growing CoNS    Bacteremia results likely reflect contaminant as it is 2 different CoNS from 2 different sets  No leukocytosis/fever on admission  Edema likley causing discomfort. Ninor cellulitic change at the right knee resolved    Plan: Stop abx Ok to discharge from id standpoint once other primary issues resolved/w/u'ed Discussed with primary team      ------------------------------------------------ Principal Problem:   Bacteremia due to methicillin resistant Staphylococcus epidermidis Active Problems:   GAD (generalized anxiety disorder)   Chronic back pain   ADD (attention deficit disorder)   Cellulitis   IVDU (intravenous drug user)   Hypoalbuminemia   Subclinical hypothyroidism   Lower extremity edema   Lumbar back pain    HPI: [name] is a 50 y.o. male distant hx ivdu, subclinical hypothyroidism, bilateral LE edema, admitted 8/5 for legs pain in setting swelling  He has no fever/chill There is no leukocytosis or evidence sepsis on presentation Bcx grew 1 set staph epi and 1 set staph hominis  He was placed on vanc for concern cellulitis and given lasix  I reviewed picture from admission. There is a scabbed area right knee with about 3 cm from edge erythema  His swelling/pain in the legs had resolved   He is feeling well at this time  Family History  Problem Relation Age of Onset   Hypertension Mother    Cancer Maternal Grandmother        ovarian   Diabetes Paternal Grandfather    Coronary artery disease Father     Social History   Tobacco Use   Smoking status: Light Smoker     Packs/day: 0.00    Years: 26.00    Pack years: 0.00    Types: Cigarettes    Last attempt to quit: 04/05/2015    Years since quitting: 5.4   Smokeless tobacco: Never  Vaping Use   Vaping Use: Never used  Substance Use Topics   Alcohol use: Yes    Alcohol/week: 0.0 standard drinks    Comment: 3 times per week   Drug use: Yes    Comment: heroin daily    Allergies  Allergen Reactions   Codeine Itching and Nausea Only   Tylenol [Acetaminophen] Other (See Comments)    headache    Review of Systems: ROS All Other ROS was negative, except mentioned above   Past Medical History:  Diagnosis Date   Anxiety    Chronic back pain    Depression    History of diverticulosis    History of kidney stones    Hypertension    Kidney stones    MRSA (methicillin resistant staph aureus) culture positive    Substance abuse ([place])    former abuser methodone and oxycodone       Scheduled Meds:  buprenorphine-naloxone  1 tablet Sublingual Daily   enoxaparin (LOVENOX) injection  40 mg Subcutaneous Q24H   sodium chloride flush  3 mL Intravenous Q12H   Continuous Infusions:  sodium chloride 250 mL (09/18/20 0420)   sodium chloride  vancomycin 1,000 mg (09/19/20 1047)   PRN Meds:.sodium chloride, alprazolam, hydrALAZINE, ketorolac   OBJECTIVE: Blood pressure (!) 142/92, pulse 68, temperature (!) 97.5 F (36.4 C), temperature source Oral, resp. rate 17, height '5\' 10"'$  (1.778 m), weight 98.7 kg, SpO2 100 %.  Physical Exam General/constitutional: no distress, pleasant HEENT: Normocephalic, PER, Conj Clear, EOMI, Oropharynx clear Neck supple CV: rrr no mrg Lungs: clear to auscultation, normal respiratory effort Abd: Soft, Nontender Ext: no edema Skin: scattered scabbing right LE; no cellulitic changes Neuro: nonfocal MSK: no peripheral joint swelling/tenderness/warmth; back spines nontender Psych alert/oriented  Central line presence: no    Lab Results Lab Results   Component Value Date   WBC 5.3 09/17/2020   HGB 12.1 (L) 09/17/2020   HCT 36.8 (L) 09/17/2020   MCV 83.3 09/17/2020   PLT 301 09/17/2020    Lab Results  Component Value Date   CREATININE 1.09 09/17/2020   BUN 15 09/17/2020   NA 136 09/17/2020   K 4.0 09/17/2020   CL 101 09/17/2020   CO2 27 09/17/2020    Lab Results  Component Value Date   ALT 13 09/16/2020   AST 13 (L) 09/16/2020   ALKPHOS 85 09/16/2020   BILITOT 0.2 (L) 09/16/2020      Microbiology: Recent Results (from the past 240 hour(s))  Culture, blood (Routine X 2) w Reflex to ID Panel     Status: Abnormal (Preliminary result)   Collection Time: 09/16/20  1:12 AM   Specimen: BLOOD LEFT FOREARM  Result Value Ref Range Status   Specimen Description   Final    BLOOD LEFT FOREARM Performed at [facility], [address], [place], [place] [address]    Special Requests   Final    BOTTLES DRAWN AEROBIC AND ANAEROBIC Blood Culture adequate volume Performed at [facility], [address] [place], [place] [address]    Culture  Setup Time   Final    GRAM POSITIVE COCCI IN CLUSTERS IN BOTH AEROBIC AND ANAEROBIC BOTTLES CRITICAL VALUE NOTED.  VALUE IS CONSISTENT WITH PREVIOUSLY REPORTED AND CALLED VALUE. Performed at [facility], [place] [address], [place], [place] [address]    Culture STAPHYLOCOCCUS HOMINIS (A)  Final   Report Status PENDING  Incomplete  Culture, blood (Routine X 2) w Reflex to ID Panel     Status: Abnormal (Preliminary result)   Collection Time: 09/16/20  1:17 AM   Specimen: BLOOD  Result Value Ref Range Status   Specimen Description   Final    BLOOD RIGHT ANTECUBITAL Performed at [facility], [address], [place], [place] [address]    Special Requests   Final    BOTTLES DRAWN AEROBIC AND ANAEROBIC Blood Culture adequate volume Performed at [facility], [address] [place], [place] [address]    Culture  Setup  Time   Final    GRAM POSITIVE COCCI IN CLUSTERS IN BOTH AEROBIC AND ANAEROBIC BOTTLES CRITICAL RESULT CALLED TO, READ BACK BY AND VERIFIED WITH: Trude Mcburney [id number] 09/17/2020 T. TYSOR    Culture (A)  Final    STAPHYLOCOCCUS EPIDERMIDIS CULTURE REINCUBATED FOR BETTER GROWTH Performed at [facility], [place] [address] [place], [place] [address]    Report Status PENDING  Incomplete  Blood Culture ID Panel (Reflexed)     Status: Abnormal   Collection Time: 09/16/20  1:17 AM  Result Value Ref Range Status   Enterococcus faecalis NOT DETECTED NOT DETECTED Final   Enterococcus  Faecium NOT DETECTED NOT DETECTED Final   Listeria monocytogenes NOT DETECTED NOT DETECTED Final   Staphylococcus species DETECTED (A) NOT DETECTED Final    Comment: CRITICAL RESULT CALLED TO, READ BACK BY AND VERIFIED WITH: M. Paul Dykes [id number] 09/17/2020 T. TYSOR    Staphylococcus aureus (BCID) NOT DETECTED NOT DETECTED Final   Staphylococcus epidermidis DETECTED (A) NOT DETECTED Final    Comment: Methicillin (oxacillin) resistant coagulase negative staphylococcus. Possible blood culture contaminant (unless isolated from more than one blood culture draw or clinical case suggests pathogenicity). No antibiotic treatment is indicated for blood  culture contaminants. CRITICAL RESULT CALLED TO, READ BACK BY AND VERIFIED WITH: M. Paul Dykes [id number] 09/17/2020 T. TYSOR    Staphylococcus lugdunensis NOT DETECTED NOT DETECTED Final   Streptococcus species NOT DETECTED NOT DETECTED Final   Streptococcus agalactiae NOT DETECTED NOT DETECTED Final   Streptococcus pneumoniae NOT DETECTED NOT DETECTED Final   Streptococcus pyogenes NOT DETECTED NOT DETECTED Final   A.calcoaceticus-baumannii NOT DETECTED NOT DETECTED Final   Bacteroides fragilis NOT DETECTED NOT DETECTED Final   Enterobacterales NOT DETECTED NOT DETECTED Final   Enterobacter cloacae complex NOT DETECTED NOT DETECTED Final   Escherichia coli NOT  DETECTED NOT DETECTED Final   Klebsiella aerogenes NOT DETECTED NOT DETECTED Final   Klebsiella oxytoca NOT DETECTED NOT DETECTED Final   Klebsiella pneumoniae NOT DETECTED NOT DETECTED Final   Proteus species NOT DETECTED NOT DETECTED Final   Salmonella species NOT DETECTED NOT DETECTED Final   Serratia marcescens NOT DETECTED NOT DETECTED Final   Haemophilus influenzae NOT DETECTED NOT DETECTED Final   Neisseria meningitidis NOT DETECTED NOT DETECTED Final   Pseudomonas aeruginosa NOT DETECTED NOT DETECTED Final   Stenotrophomonas maltophilia NOT DETECTED NOT DETECTED Final   Candida albicans NOT DETECTED NOT DETECTED Final   Candida auris NOT DETECTED NOT DETECTED Final   Candida glabrata NOT DETECTED NOT DETECTED Final   Candida krusei NOT DETECTED NOT DETECTED Final   Candida parapsilosis NOT DETECTED NOT DETECTED Final   Candida tropicalis NOT DETECTED NOT DETECTED Final   Cryptococcus neoformans/gattii NOT DETECTED NOT DETECTED Final   Methicillin resistance mecA/C DETECTED (A) NOT DETECTED Final    Comment: CRITICAL RESULT CALLED TO, READ BACK BY AND VERIFIED WITH: Trude Mcburney [id number] 09/17/2020 Mena Goes Performed at [facility], [address], [place], [place] [address]   Resp Panel by RT-PCR (Flu A&B, Covid) Nasopharyngeal Swab     Status: None   Collection Time: 09/16/20  4:31 AM   Specimen: Nasopharyngeal Swab; Nasopharyngeal(NP) swabs in vial transport medium  Result Value Ref Range Status   SARS Coronavirus 2 by RT PCR NEGATIVE NEGATIVE Final    Comment: (NOTE) SARS-CoV-2 target nucleic acids are NOT DETECTED.  The SARS-CoV-2 RNA is generally detectable in upper respiratory specimens during the acute phase of infection. The lowest concentration of SARS-CoV-2 viral copies this assay can detect is 138 copies/mL. A negative result does not preclude SARS-Cov-2 infection and should not be used as the sole basis for treatment or other patient management  decisions. A negative result may occur with  improper specimen collection/handling, submission of specimen other than nasopharyngeal swab, presence of viral mutation(s) within the areas targeted by this assay, and inadequate number of viral copies(<138 copies/mL). A negative result must be combined with clinical observations, patient history, and epidemiological information. The expected result is Negative.  Fact Sheet for Patients:  EntrepreneurPulse.com.au  Fact Sheet for Healthcare Providers:  IncredibleEmployment.be  This test is no  t yet approved or cleared by the [place] and  has been authorized for detection and/or diagnosis of SARS-CoV-2 by FDA under an Emergency Use Authorization (EUA). This EUA will remain  in effect (meaning this test can be used) for the duration of the COVID-19 declaration under Section 564(b)(1) of the Act, 21 U.S.C.section 360bbb-3(b)(1), unless the authorization is terminated  or revoked sooner.       Influenza A by PCR NEGATIVE NEGATIVE Final   Influenza B by PCR NEGATIVE NEGATIVE Final    Comment: (NOTE) The Xpert Xpress SARS-CoV-2/FLU/RSV plus assay is intended as an aid in the diagnosis of influenza from Nasopharyngeal swab specimens and should not be used as a sole basis for treatment. Nasal washings and aspirates are unacceptable for Xpert Xpress SARS-CoV-2/FLU/RSV testing.  Fact Sheet for Patients: EntrepreneurPulse.com.au  Fact Sheet for Healthcare Providers: IncredibleEmployment.be  This test is not yet approved or cleared by the [place] FDA and has been authorized for detection and/or diagnosis of SARS-CoV-2 by FDA under an Emergency Use Authorization (EUA). This EUA will remain in effect (meaning this test can be used) for the duration of the COVID-19 declaration under Section 564(b)(1) of the Act, 21 U.S.C. section 360bbb-3(b)(1), unless the  authorization is terminated or revoked.  Performed at [facility], [address] [place], [place] [address]      Serology:    Imaging: If present, new imagings (plain films, ct scans, and mri) have been personally visualized and interpreted; radiology reports have been reviewed. Decision making incorporated into the Impression / Recommendations.  09/16/2020 1. No acute osseous abnormality or evidence of infection in the lumbar spine. 2. Lumbar disc and facet degeneration with mild-to-moderate spinal stenosis at L3-4 and L4-5. 3. Moderate bilateral neural foraminal stenosis at L4-5. 4. Nonobstructing left nephrolithiasis.  Jabier Mutton, [facility] [place] [phone] pager    09/19/2020, 11:08 AM

## 2020-09-19 NOTE — Progress Notes (Signed)
Patient refusing vital signs and assessment.

## 2020-09-19 NOTE — Progress Notes (Signed)
PROGRESS NOTE    [name]   FKC:[id number]  DOB: 04/11/1970  DOA: 09/15/2020 PCP: Patient, No Pcp Per (Inactive)   Brief Narrative:  [name] 50 y/o with HTN, IV drug abuse, GAD, Chronic back pain, ADD, Nicotine abuse, subclinical hypothyroidism who comes to the ED for b/l ankle pain and swelling progressing over 1 month. He was sent from UC. He denied IV drug use in the ED despite having a number of skin abrasions/ lesions in various stages of healing and folliculitis. He also admitted to severe lower back pain. Cellulitis of the right knee was noted. Septic arthritis, dsicitis and endocarditis were suspected in the ED and he was admitted for IV Antibiotics. Started on Vancomycin.  Xrays of b/l ankles unrevealing Xray of right knee revealed soft tissue edema CT L spine negative.  UDS ordered but not done.  Given 1 dose of LV Lasix for pedal edema. Started on Toradol, Xanax and PRN Hydralazine.   Admitted in 07/21/18 for multiple areas of cellulitis and abscesses.  Admitted 06/11/18 for an abscess Was being treated with Suboxone in 2016 by Dr Toy Care.   After admission he threatened to leave AMA repeatedly. Blood cultures noted to be positive. Suboxone started. Awaiting sensitivities. ID feels he has contaminants but I am not fully convinced of this.   Subjective: He wants to leave AMA again today.    Assessment & Plan:   Principal Problem:   Bacteremia due to methicillin resistant Staphylococcus epidermidis- diffuse folliculitis - lumbar pain - IVDA - blood cultures 4/4 + for staph hominis in 1 set and stap epi in another set (BCID suggests methicillin resistance) - multiple skin lesions in various stages of healing noted- no drainage noted in lesions - cont Vanc - lumbar CT negative- pain resolved  - CRP 3.1, ESR 47 - 2 D ECHO  is negative for endocarditis - ID feels that the organisms on his blood cultures are contaminants but I would like to follow up on  the sensitivities and send him home on oral antibiotics   Active Problems: IVDA  - admits to "a lot" of Fentanyl and Methamphetamines abuse but did not admit to this in the ED - Suboxone started at 8 mg BID on 8/7 for severe withdrawal- he has been stable since - UDS on 8/9 + for Amphetamines an Benzodiazepines - I have discussed our Suboxone clinic with him yesterday and today - He will need to call the [facility] clinic at [phone] to set up an appt with them-     GAD (generalized anxiety disorder) - resumed Xanax PRN      Chronic back pain - Has not been asking for the PRN Toradol- will dc it    ADD (attention deficit disorder) - holding Adderal      Subclinical hypothyroidism - TSH is 5.858 - FT4 normal at 0.79     Time spent in minutes: 35 DVT prophylaxis: enoxaparin (LOVENOX) injection 40 mg Start: 09/16/20 1600  Code Status: Full code Family Communication:  Level of Care: Level of care: Med-Surg Disposition Plan:  Status is: Inpatient  Remains inpatient appropriate because:IV treatments appropriate due to intensity of illness or inability to take PO  Dispo: The patient is from: Home              Anticipated d/c is to: Home              Patient currently is not medically stable to d/c.  Difficult to place patient No   Consultants:  ID Procedures:  TTE Antimicrobials:  Anti-infectives (From admission, onward)    Start     Dose/Rate Route Frequency Ordered Stop   09/18/20 2215  vancomycin (VANCOCIN) IVPB 1000 mg/200 mL premix  Status:  Discontinued        1,000 mg 200 mL/hr over 60 Minutes Intravenous  Once 09/18/20 2115 09/18/20 2116   09/16/20 1600  vancomycin (VANCOCIN) IVPB 1000 mg/200 mL premix  Status:  Discontinued        1,000 mg 200 mL/hr over 60 Minutes Intravenous Every 12 hours 09/16/20 1535 09/19/20 1122   09/16/20 0445  vancomycin (VANCOCIN) IVPB 1000 mg/200 mL premix        1,000 mg 200 mL/hr over 60 Minutes Intravenous  Once 09/16/20  0430 09/16/20 0555        Objective: Vitals:   09/18/20 1723 09/18/20 2141 09/19/20 0610 09/19/20 0922  BP: (!) 149/98 (!) 143/93 136/76 (!) 142/92  Pulse: 70 64 76 68  Resp: '18 18 20 17  ' Temp: 97.7 F (36.5 C) 97.9 F (36.6 C) 98.6 F (37 C) (!) 97.5 F (36.4 C)  TempSrc: Oral Oral Oral Oral  SpO2: 100% 95% 98% 100%  Weight:      Height:        Intake/Output Summary (Last 24 hours) at 09/19/2020 1412 Last data filed at 09/19/2020 1253 Gross per 24 hour  Intake 559.98 ml  Output 650 ml  Net -90.02 ml    Filed Weights   09/15/20 2020 09/16/20 1411 09/16/20 2107  Weight: 97.5 kg 98.7 kg 98.7 kg    Examination: General exam: Appears comfortable  HEENT: PERRLA, oral mucosa moist, no sclera icterus or thrush Respiratory system: Clear to auscultation. Respiratory effort normal. Cardiovascular system: S1 & S2 heard, regular rate and rhythm Gastrointestinal system: Abdomen soft, non-tender, nondistended. Normal bowel sounds   Central nervous system: Alert and oriented. No focal neurological deficits. Extremities: No cyanosis, clubbing or edema Skin: numerous skin lesions in various stages of healing noted- on the left forearm- one of the scabs has come off- no pus no ted Psychiatry:  Mood & affect appropriate.    Data Reviewed: I have personally reviewed following labs and imaging studies  CBC: Recent Labs  Lab 09/16/20 0112 09/17/20 0747  WBC 6.7 5.3  NEUTROABS 4.5  --   HGB 11.4* 12.1*  HCT 34.8* 36.8*  MCV 83.9 83.3  PLT 279 161    Basic Metabolic Panel: Recent Labs  Lab 09/16/20 0112 09/17/20 0747  NA 137 136  K 3.8 4.0  CL 101 101  CO2 28 27  GLUCOSE 104* 97  BUN 18 15  CREATININE 1.06 1.09  CALCIUM 8.6* 8.9    GFR: Estimated Creatinine Clearance: 97.7 mL/min (by C-G formula based on SCr of 1.09 mg/dL). Liver Function Tests: Recent Labs  Lab 09/16/20 0112  AST 13*  ALT 13  ALKPHOS 85  BILITOT 0.2*  PROT 6.9  ALBUMIN 3.4*    No  results for input(s): LIPASE, AMYLASE in the last 168 hours. No results for input(s): AMMONIA in the last 168 hours. Coagulation Profile: No results for input(s): INR, PROTIME in the last 168 hours. Cardiac Enzymes: No results for input(s): CKTOTAL, CKMB, CKMBINDEX, TROPONINI in the last 168 hours. BNP (last 3 results) No results for input(s): PROBNP in the last 8760 hours. HbA1C: No results for input(s): HGBA1C in the last 72 hours. CBG: No results for input(s): GLUCAP in the  last 168 hours. Lipid Profile: No results for input(s): CHOL, HDL, LDLCALC, TRIG, CHOLHDL, LDLDIRECT in the last 72 hours. Thyroid Function Tests: Recent Labs    09/17/20 0747 09/17/20 0800  TSH 5.859*  --   FREET4  --  0.79    Anemia Panel: No results for input(s): VITAMINB12, FOLATE, FERRITIN, TIBC, IRON, RETICCTPCT in the last 72 hours. Urine analysis:    Component Value Date/Time   COLORURINE YELLOW 08/18/2019 0030   APPEARANCEUR CLEAR 08/18/2019 0030   LABSPEC 1.025 08/18/2019 0030   PHURINE 5.0 08/18/2019 0030   GLUCOSEU NEGATIVE 08/18/2019 0030   HGBUR NEGATIVE 08/18/2019 0030   BILIRUBINUR NEGATIVE 08/18/2019 0030   KETONESUR NEGATIVE 08/18/2019 0030   PROTEINUR NEGATIVE 08/18/2019 0030   UROBILINOGEN 0.2 12/01/2008 0412   NITRITE NEGATIVE 08/18/2019 0030   LEUKOCYTESUR TRACE (A) 08/18/2019 0030   Sepsis Labs: '@LABRCNTIP' (procalcitonin:4,lacticidven:4) ) Recent Results (from the past 240 hour(s))  Culture, blood (Routine X 2) w Reflex to ID Panel     Status: Abnormal (Preliminary result)   Collection Time: 09/16/20  1:12 AM   Specimen: BLOOD LEFT FOREARM  Result Value Ref Range Status   Specimen Description   Final    BLOOD LEFT FOREARM Performed at [facility], [place] [address] [place], [place] [address]    Special Requests   Final    BOTTLES DRAWN AEROBIC AND ANAEROBIC Blood Culture adequate volume Performed at [facility], [address]  [place], [place] [address]    Culture  Setup Time   Final    GRAM POSITIVE COCCI IN CLUSTERS IN BOTH AEROBIC AND ANAEROBIC BOTTLES CRITICAL VALUE NOTED.  VALUE IS CONSISTENT WITH PREVIOUSLY REPORTED AND CALLED VALUE. Performed at [facility], [place] [address], [place], [place] [address]    Culture STAPHYLOCOCCUS HOMINIS (A)  Final   Report Status PENDING  Incomplete  Culture, blood (Routine X 2) w Reflex to ID Panel     Status: Abnormal (Preliminary result)   Collection Time: 09/16/20  1:17 AM   Specimen: BLOOD  Result Value Ref Range Status   Specimen Description   Final    BLOOD RIGHT ANTECUBITAL Performed at [facility], [address], [place], [place] [address]    Special Requests   Final    BOTTLES DRAWN AEROBIC AND ANAEROBIC Blood Culture adequate volume Performed at [facility], [address] [place], [place] [address]    Culture  Setup Time   Final    GRAM POSITIVE COCCI IN CLUSTERS IN BOTH AEROBIC AND ANAEROBIC BOTTLES CRITICAL RESULT CALLED TO, READ BACK BY AND VERIFIED WITH: Trude Mcburney [id number] 09/17/2020 T. TYSOR    Culture (A)  Final    STAPHYLOCOCCUS EPIDERMIDIS CULTURE REINCUBATED FOR BETTER GROWTH Performed at [facility], [place] [address] [place], [place] [address]    Report Status PENDING  Incomplete  Blood Culture ID Panel (Reflexed)     Status: Abnormal   Collection Time: 09/16/20  1:17 AM  Result Value Ref Range Status   Enterococcus faecalis NOT DETECTED NOT DETECTED Final   Enterococcus Faecium NOT DETECTED NOT DETECTED Final   Listeria monocytogenes NOT DETECTED NOT DETECTED Final   Staphylococcus species DETECTED (A) NOT DETECTED Final    Comment: CRITICAL RESULT CALLED TO, READ BACK BY AND VERIFIED WITH: M. Paul Dykes 0300 09/17/2020 T. TYSOR    Staphylococcus aureus (BCID) NOT DETECTED NOT DETECTED Final   Staphylococcus epidermidis DETECTED (A) NOT DETECTED Final    Comment:  Methicillin  (oxacillin) resistant coagulase negative staphylococcus. Possible blood culture contaminant (unless isolated from more than one blood culture draw or clinical case suggests pathogenicity). No antibiotic treatment is indicated for blood  culture contaminants. CRITICAL RESULT CALLED TO, READ BACK BY AND VERIFIED WITH: M. Paul Dykes [phone] 09/17/2020 T. TYSOR    Staphylococcus lugdunensis NOT DETECTED NOT DETECTED Final   Streptococcus species NOT DETECTED NOT DETECTED Final   Streptococcus agalactiae NOT DETECTED NOT DETECTED Final   Streptococcus pneumoniae NOT DETECTED NOT DETECTED Final   Streptococcus pyogenes NOT DETECTED NOT DETECTED Final   A.calcoaceticus-baumannii NOT DETECTED NOT DETECTED Final   Bacteroides fragilis NOT DETECTED NOT DETECTED Final   Enterobacterales NOT DETECTED NOT DETECTED Final   Enterobacter cloacae complex NOT DETECTED NOT DETECTED Final   Escherichia coli NOT DETECTED NOT DETECTED Final   Klebsiella aerogenes NOT DETECTED NOT DETECTED Final   Klebsiella oxytoca NOT DETECTED NOT DETECTED Final   Klebsiella pneumoniae NOT DETECTED NOT DETECTED Final   Proteus species NOT DETECTED NOT DETECTED Final   Salmonella species NOT DETECTED NOT DETECTED Final   Serratia marcescens NOT DETECTED NOT DETECTED Final   Haemophilus influenzae NOT DETECTED NOT DETECTED Final   Neisseria meningitidis NOT DETECTED NOT DETECTED Final   Pseudomonas aeruginosa NOT DETECTED NOT DETECTED Final   Stenotrophomonas maltophilia NOT DETECTED NOT DETECTED Final   Candida albicans NOT DETECTED NOT DETECTED Final   Candida auris NOT DETECTED NOT DETECTED Final   Candida glabrata NOT DETECTED NOT DETECTED Final   Candida krusei NOT DETECTED NOT DETECTED Final   Candida parapsilosis NOT DETECTED NOT DETECTED Final   Candida tropicalis NOT DETECTED NOT DETECTED Final   Cryptococcus neoformans/gattii NOT DETECTED NOT DETECTED Final   Methicillin resistance mecA/C DETECTED (A) NOT DETECTED  Final    Comment: CRITICAL RESULT CALLED TO, READ BACK BY AND VERIFIED WITH: Trude Mcburney [phone] 09/17/2020 Mena Goes Performed at [facility], [address], [place], [place] [address]   Resp Panel by RT-PCR (Flu A&B, Covid) Nasopharyngeal Swab     Status: None   Collection Time: 09/16/20  4:31 AM   Specimen: Nasopharyngeal Swab; Nasopharyngeal(NP) swabs in vial transport medium  Result Value Ref Range Status   SARS Coronavirus 2 by RT PCR NEGATIVE NEGATIVE Final    Comment: (NOTE) SARS-CoV-2 target nucleic acids are NOT DETECTED.  The SARS-CoV-2 RNA is generally detectable in upper respiratory specimens during the acute phase of infection. The lowest concentration of SARS-CoV-2 viral copies this assay can detect is 138 copies/mL. A negative result does not preclude SARS-Cov-2 infection and should not be used as the sole basis for treatment or other patient management decisions. A negative result may occur with  improper specimen collection/handling, submission of specimen other than nasopharyngeal swab, presence of viral mutation(s) within the areas targeted by this assay, and inadequate number of viral copies(<138 copies/mL). A negative result must be combined with clinical observations, patient history, and epidemiological information. The expected result is Negative.  Fact Sheet for Patients:  EntrepreneurPulse.com.au  Fact Sheet for Healthcare Providers:  IncredibleEmployment.be  This test is no t yet approved or cleared by the [place] FDA and  has been authorized for detection and/or diagnosis of SARS-CoV-2 by FDA under an Emergency Use Authorization (EUA). This EUA will remain  in effect (meaning this test can be used) for the duration of the COVID-19 declaration under Section 564(b)(1) of the Act, 21 U.S.C.section 360bbb-3(b)(1), unless the authorization is terminated  or revoked sooner.  Influenza A by PCR  NEGATIVE NEGATIVE Final   Influenza B by PCR NEGATIVE NEGATIVE Final    Comment: (NOTE) The Xpert Xpress SARS-CoV-2/FLU/RSV plus assay is intended as an aid in the diagnosis of influenza from Nasopharyngeal swab specimens and should not be used as a sole basis for treatment. Nasal washings and aspirates are unacceptable for Xpert Xpress SARS-CoV-2/FLU/RSV testing.  Fact Sheet for Patients: EntrepreneurPulse.com.au  Fact Sheet for Healthcare Providers: IncredibleEmployment.be  This test is not yet approved or cleared by the [place] FDA and has been authorized for detection and/or diagnosis of SARS-CoV-2 by FDA under an Emergency Use Authorization (EUA). This EUA will remain in effect (meaning this test can be used) for the duration of the COVID-19 declaration under Section 564(b)(1) of the Act, 21 U.S.C. section 360bbb-3(b)(1), unless the authorization is terminated or revoked.  Performed at [facility], [address], [place], [place] [address]          Radiology Studies: ECHOCARDIOGRAM COMPLETE  Result Date: 09/18/2020    ECHOCARDIOGRAM REPORT   Patient Name:   SEFERINO OSCAR [name] Date of Exam: 09/18/2020 Medical Rec #:  [id number]            Height:       70.0 in Accession #:    [id number]           Weight:       217.6 lb Date of Birth:  04-28-1970           BSA:          2.164 m Patient Age:    40 years             BP:           134/95 mmHg Patient Gender: M                    HR:           61 bpm. Exam Location:  Inpatient Procedure: 2D Echo, Cardiac Doppler and Color Doppler Indications:    Bacteremia  History:        Patient has no prior history of Echocardiogram examinations.                 Signs/Symptoms:Bacteremia; Risk Factors:Hypertension and Current                 Smoker. GAD, IV drug abuse, LE edema, cellulitis, MRSA.  Sonographer:    Dustin Flock Referring Phys: [place]  1. Left  ventricular ejection fraction, by estimation, is 55 to 60%. The left ventricle has normal function. The left ventricle has no regional wall motion abnormalities. Left ventricular diastolic parameters are consistent with Grade I diastolic dysfunction (impaired relaxation).  2. Right ventricular systolic function is normal. The right ventricular size is normal. Tricuspid regurgitation signal is inadequate for assessing PA pressure.  3. The mitral valve is normal in structure. Trivial mitral valve regurgitation. No evidence of mitral stenosis.  4. The aortic valve is tricuspid. Aortic valve regurgitation is not visualized. No aortic stenosis is present.  5. The inferior vena cava is normal in size with greater than 50% respiratory variability, suggesting right atrial pressure of 3 mmHg. FINDINGS  Left Ventricle: Left ventricular ejection fraction, by estimation, is 55 to 60%. The left ventricle has normal function. The left ventricle has no regional wall motion abnormalities. The left ventricular internal cavity size was normal in size. There is  no left ventricular hypertrophy.  Left ventricular diastolic parameters are consistent with Grade I diastolic dysfunction (impaired relaxation). Right Ventricle: The right ventricular size is normal. Right ventricular systolic function is normal. Tricuspid regurgitation signal is inadequate for assessing PA pressure. The tricuspid regurgitant velocity is 2.05 m/s, and with an assumed right atrial  pressure of 3 mmHg, the estimated right ventricular systolic pressure is 38.9 mmHg. Left Atrium: Left atrial size was normal in size. Right Atrium: Right atrial size was normal in size. Pericardium: There is no evidence of pericardial effusion. Mitral Valve: The mitral valve is normal in structure. Mild mitral annular calcification. Trivial mitral valve regurgitation. No evidence of mitral valve stenosis. Tricuspid Valve: The tricuspid valve is normal in structure. Tricuspid valve  regurgitation is trivial. No evidence of tricuspid stenosis. Aortic Valve: The aortic valve is tricuspid. Aortic valve regurgitation is not visualized. No aortic stenosis is present. Pulmonic Valve: The pulmonic valve was normal in structure. Pulmonic valve regurgitation is not visualized. No evidence of pulmonic stenosis. Aorta: The aortic root is normal in size and structure. Venous: The inferior vena cava is normal in size with greater than 50% respiratory variability, suggesting right atrial pressure of 3 mmHg. IAS/Shunts: No atrial level shunt detected by color flow Doppler.  LEFT VENTRICLE PLAX 2D LVIDd:         4.30 cm  Diastology LVIDs:         3.40 cm  LV e' medial:    6.96 cm/s LV PW:         1.00 cm  LV E/e' medial:  9.7 LV IVS:        1.10 cm  LV e' lateral:   9.25 cm/s LVOT diam:     2.10 cm  LV E/e' lateral: 7.3 LV SV:         97 LV SV Index:   45 LVOT Area:     3.46 cm  RIGHT VENTRICLE RV Basal diam:  3.80 cm RV S prime:     10.80 cm/s TAPSE (M-mode): 1.9 cm LEFT ATRIUM             Index       RIGHT ATRIUM           Index LA diam:        3.60 cm 1.66 cm/m  RA Area:     14.40 cm LA Vol (A2C):   57.7 ml 26.67 ml/m RA Volume:   37.10 ml  17.15 ml/m LA Vol (A4C):   38.2 ml 17.66 ml/m LA Biplane Vol: 47.3 ml 21.86 ml/m  AORTIC VALVE LVOT Vmax:   113.00 cm/s LVOT Vmean:  82.300 cm/s LVOT VTI:    0.280 m  AORTA Ao Root diam: 3.30 cm MITRAL VALVE               TRICUSPID VALVE MV Area (PHT): 2.63 cm    TR Peak grad:   16.8 mmHg MV Decel Time: 288 msec    TR Vmax:        205.00 cm/s MV E velocity: 67.70 cm/s MV A velocity: 80.60 cm/s  SHUNTS MV E/A ratio:  0.84        Systemic VTI:  0.28 m                            Systemic Diam: 2.10 cm Kirk Ruths MD Electronically signed by Kirk Ruths MD Signature Date/Time: 09/18/2020/1:16:09 PM    Final  Scheduled Meds:  buprenorphine-naloxone  1 tablet Sublingual Daily   enoxaparin (LOVENOX) injection  40 mg Subcutaneous Q24H   sodium chloride  flush  3 mL Intravenous Q12H   Continuous Infusions:  sodium chloride 250 mL (09/18/20 0420)   sodium chloride       LOS: 3 days      Debbe Odea, MD [facility] Pager: [email or web address] 09/19/2020, 2:12 PM

## 2020-09-19 NOTE — Anesthesia Preprocedure Evaluation (Deleted)
Anesthesia Evaluation    Reviewed: Allergy & Precautions, Patient's Chart, lab work & pertinent test results  Airway        Dental   Pulmonary neg pulmonary ROS, Current Smoker,           Cardiovascular hypertension, negative cardio ROS    TTE 2022 1. Left ventricular ejection fraction, by estimation, is 55 to 60%. The  left ventricle has normal function. The left ventricle has no regional  wall motion abnormalities. Left ventricular diastolic parameters are  consistent with Grade I diastolic  dysfunction (impaired relaxation).  2. Right ventricular systolic function is normal. The right ventricular  size is normal. Tricuspid regurgitation signal is inadequate for assessing  PA pressure.  3. The mitral valve is normal in structure. Trivial mitral valve  regurgitation. No evidence of mitral stenosis.  4. The aortic valve is tricuspid. Aortic valve regurgitation is not  visualized. No aortic stenosis is present.  5. The inferior vena cava is normal in size with greater than 50%  respiratory variability, suggesting right atrial pressure of 3 mmHg.   Neuro/Psych PSYCHIATRIC DISORDERS Anxiety Depression negative neurological ROS     GI/Hepatic negative GI ROS, (+)     substance abuse  IV drug use,   Endo/Other  Hypothyroidism   Renal/GU negative Renal ROS  negative genitourinary   Musculoskeletal negative musculoskeletal ROS (+) narcotic dependent  Abdominal   Peds  Hematology negative hematology ROS (+)   Anesthesia Other Findings 50 y/o with HTN, IV drug abuse, GAD, Chronic back pain, ADD, Nicotine abuse, subclinical hypothyroidism who comes to the ED for b/l ankle pain and swelling progressing over 1 month. Found to have bacteremia due to methicillin resistant Staphylococcus epidermidis   Reproductive/Obstetrics                             Anesthesia Physical Anesthesia Plan  ASA:  3  Anesthesia Plan: MAC   Post-op Pain Management:    Induction: Intravenous  PONV Risk Score and Plan: Propofol infusion and Treatment may vary due to age or medical condition  Airway Management Planned: Natural Airway  Additional Equipment:   Intra-op Plan:   Post-operative Plan:   Informed Consent: I have reviewed the patients History and Physical, chart, labs and discussed the procedure including the risks, benefits and alternatives for the proposed anesthesia with the patient or authorized representative who has indicated his/her understanding and acceptance.     Dental advisory given  Plan Discussed with: CRNA  Anesthesia Plan Comments: (Case cancelled. Pt declined to have procedure. )       Anesthesia Quick Evaluation

## 2020-09-19 NOTE — Plan of Care (Signed)
  Problem: Education: Goal: Knowledge of General Education information will improve Description: Including pain rating scale, medication(s)/side effects and non-pharmacologic comfort measures Outcome: Completed/Met

## 2020-09-20 MED FILL — Fentanyl Citrate Preservative Free (PF) Inj 100 MCG/2ML: INTRAMUSCULAR | Qty: 4 | Status: AC

## 2020-09-20 NOTE — Progress Notes (Addendum)
DISCHARGE NOTE HOME CASPAR [id number] II to be discharged Home per MD order. Patient verbalized understanding.  Skin clean, dry and intact without evidence of skin break down, no evidence of skin tears noted. IV catheter discontinued intact. Site without signs and symptoms of complications. Dressing and pressure applied. Pt denies pain at the site currently. No complaints noted.  Patient free of lines, drains, and wounds.   Discharge packet assembled. An After Visit Summary (AVS) was printed and given to the patient. Patient ambulate to his ride and discharged to home  Arlyss Repress, RN

## 2020-09-20 NOTE — Discharge Summary (Signed)
Physician Discharge Summary  [name] [id number] DOB: April 02, 1970 DOA: 09/15/2020  PCP: Patient, No Pcp Per (Inactive)  Admit date: 09/15/2020 Discharge date: 09/20/2020  Admitted From: Home Disposition: Home  Recommendations for Outpatient Follow-up:  Follow up with PCP in 1 week with repeat CBC/BMP Outpatient follow-up with Suboxone clinic at earliest convenience Comply with medications and follow-up Abstain from illicit drug use Follow up in ED if symptoms worsen or new appear   Home Health: No Equipment/Devices: None  Discharge Condition: Stable CODE STATUS: Full Diet recommendation: Heart healthy  Brief/Interim Summary: 50 y/o with HTN, IV drug abuse, GAD, Chronic back pain, ADD, Nicotine abuse, subclinical hypothyroidism presented with bilateral ankle pain and swelling progressing over 1 month.  Patient denied recent IV drug use but was found to have number of skin abrasion/lesions in various stages of healing and folliculitis.  He was admitted with possible right knee cellulitis with concern for septic arthritis, discitis and endocarditis and started on IV vancomycin.  During the hospitalization, x-rays of bilateral ankles and right knee were unrevealing.  CT of L-spine was negative for osteomyelitis/discitis.  He was started on Suboxone during the hospitalization.  He was found to have bacteremia for which ID was consulted.  ID thought that bacteremia was probably contaminant and has discontinued antibiotics.  2D echo was negative for endocarditis.  Repeat blood cultures have been negative so far.  He is currently afebrile and hemodynamically stable.  He will be discharged home today with outpatient follow-up with Suboxone clinic at earliest convenience.  Discharge Diagnoses:   Bacteremia: 2 different coagulase-negative Staphylococcus from 2 different sets, most likely contaminant as per ID Folliculitis Lumbar pain History of IVDA -Patient was empirically started  on IV antibiotics/vancomycin and blood cultures grew 2 different Staphylococcus coagulase-negative from 2 different sets.  Repeat cultures have been negative so far. -Lumbar spine CT was negative for osteomyelitis discitis. -2D echo negative for endocarditis. -ID evaluated the patient and thought that blood cultures were possibly contaminant and antibiotics were discontinued.  ID has cleared the patient for discharge. -Discharge patient home today on no antibiotics -Patient was started on Suboxone on 09/17/2020 for severe withdrawal and subsequently has been stable.  UDS was positive for amphetamines and benzodiazepines.  He will need to call [facility] his Suboxone clinic at [phone] to set up an appointment with them at earliest convenience.  GAD EGD -Continue as needed Xanax.  Continue Adderall.  Outpatient follow-up with PCP/psychiatry  Subclinical hypothyroidism  -TSH 5.858.  Free T4 normal at 0.79.  Outpatient follow-up.    Discharge Instructions  Discharge Instructions     Diet - low sodium heart healthy   Complete by: As directed    Increase activity slowly   Complete by: As directed    No wound care   Complete by: As directed       Allergies as of 09/20/2020       Reactions   Codeine Itching, Nausea Only   Tylenol [acetaminophen] Other (See Comments)   headache        Medication List     TAKE these medications    alprazolam 2 MG tablet Commonly known as: XANAX TAKE 1 TABLET BY MOUTH 3 TIMES DAILY AS NEEDED FOR ANXIETY What changed:  how much to take how to take this when to take this additional instructions   amphetamine-dextroamphetamine 20 MG tablet Commonly known as: ADDERALL Take 20 mg by mouth 3 (three) times daily.   citalopram 40 MG tablet Commonly  known as: CELEXA Take 1 tablet (40 mg total) by mouth daily.   ibuprofen 200 MG tablet Commonly known as: ADVIL Take 600 mg by mouth every 6 (six) hours as needed for headache (pain).         Follow-up Information     PCP. Schedule an appointment as soon as possible for a visit in 1 week(s).          Suboxone clinic. Call today.   Why: at earliest convenience               Allergies  Allergen Reactions   Codeine Itching and Nausea Only   Tylenol [Acetaminophen] Other (See Comments)    headache    Consultations: ID   Procedures/Studies: DG Knee 1-2 Views Right  Result Date: 09/16/2020 CLINICAL DATA:  Cellulitis EXAM: RIGHT KNEE - 1-2 VIEW COMPARISON:  None. FINDINGS: No evidence of fracture, dislocation, or joint effusion. No evidence of arthropathy or other focal bone abnormality. Soft tissue edema anteriorly. IMPRESSION: No fracture or dislocation of the right knee. Joint spaces are well preserved. Soft tissue edema anteriorly. Electronically Signed   By: Eddie Candle M.D.   On: 09/16/2020 19:36   DG Ankle Complete Left  Result Date: 09/16/2020 CLINICAL DATA:  Pain, swelling EXAM: LEFT ANKLE COMPLETE - 3+ VIEW COMPARISON:  None. FINDINGS: Diffuse soft tissue swelling. No acute bony abnormality. Specifically, no fracture, subluxation, or dislocation. Joint spaces maintained. IMPRESSION: No acute bony abnormality. Electronically Signed   By: Rolm Baptise M.D.   On: 09/16/2020 01:47   DG Ankle Complete Right  Result Date: 09/16/2020 CLINICAL DATA:  Pain, swelling EXAM: RIGHT ANKLE - COMPLETE 3+ VIEW COMPARISON:  None. FINDINGS: Diffuse soft tissue swelling. No acute bony abnormality. Specifically, no fracture, subluxation, or dislocation. Joint spaces maintained. IMPRESSION: No acute bony abnormality. Electronically Signed   By: Rolm Baptise M.D.   On: 09/16/2020 01:47   CT LUMBAR SPINE W CONTRAST  Result Date: 09/17/2020 CLINICAL DATA:  Low back pain, infection suspected. EXAM: CT LUMBAR SPINE WITH CONTRAST TECHNIQUE: Multidetector CT imaging of the lumbar spine was performed with intravenous contrast administration. CONTRAST:  143m OMNIPAQUE IOHEXOL 300 MG/ML   SOLN COMPARISON:  CT abdomen and pelvis 08/17/2019 FINDINGS: Segmentation: 5 lumbar type vertebrae. Alignment: Slight lumbar dextroscoliosis.  No significant listhesis. Vertebrae: No acute fracture or suspicious osseous lesion. No endplate erosion to suggest discitis-osteomyelitis. Vacuum disc at L1-2, L4-5, and L5-S1. Paraspinal and other soft tissues: Status post cholecystectomy. Asymmetric left renal atrophy with multiple small nonobstructing left renal calculi. No paraspinal fluid collection. Disc levels: T12-L1: Mild spondylosis without stenosis. L1-2: Mild-to-moderate disc space narrowing. Disc bulging and mild facet hypertrophy result in likely mild left lateral recess and left neural foraminal stenosis without significant spinal stenosis. L2-3: Mild disc space narrowing. Circumferential disc bulging, a broad central disc protrusion, and mild facet hypertrophy result in mild spinal stenosis and left greater than right lateral recess stenosis without significant neural foraminal stenosis. L3-4: Mild disc space narrowing. Circumferential disc bulging and mild facet hypertrophy result in mild-to-moderate spinal stenosis, mild bilateral lateral recess stenosis, and mild bilateral neural foraminal stenosis. L4-5: Moderate disc space narrowing. Circumferential disc bulging, endplate spurring, and mild facet hypertrophy result in mild to moderate spinal stenosis, likely moderate bilateral lateral recess stenosis, and moderate bilateral neural foraminal stenosis. L5-S1: Mild disc space narrowing. Disc bulging and mild facet hypertrophy result in mild right greater than left neural foraminal stenosis without spinal stenosis. IMPRESSION: 1. No acute osseous  abnormality or evidence of infection in the lumbar spine. 2. Lumbar disc and facet degeneration with mild-to-moderate spinal stenosis at L3-4 and L4-5. 3. Moderate bilateral neural foraminal stenosis at L4-5. 4. Nonobstructing left nephrolithiasis. Electronically  Signed   By: Logan Bores M.D.   On: 09/17/2020 12:16   DG Chest Port 1 View  Result Date: 09/16/2020 CLINICAL DATA:  Bilateral ankle pain, swelling for 1 month EXAM: PORTABLE CHEST 1 VIEW COMPARISON:  07/19/2018 FINDINGS: The heart size and mediastinal contours are within normal limits. Both lungs are clear. The visualized skeletal structures are unremarkable. IMPRESSION: Normal study. Electronically Signed   By: Rolm Baptise M.D.   On: 09/16/2020 01:43   ECHOCARDIOGRAM COMPLETE  Result Date: 09/18/2020    ECHOCARDIOGRAM REPORT   Patient Name:   [name] Date of Exam: 09/18/2020 Medical Rec #:  [id number]            Height:       70.0 in Accession #:    [id number]           Weight:       217.6 lb Date of Birth:  [birth year]           BSA:          2.164 m Patient Age:    50 years             BP:           134/95 mmHg Patient Gender: M                    HR:           61 bpm. Exam Location:  Inpatient Procedure: 2D Echo, Cardiac Doppler and Color Doppler Indications:    Bacteremia  History:        Patient has no prior history of Echocardiogram examinations.                 Signs/Symptoms:Bacteremia; Risk Factors:Hypertension and Current                 Smoker. GAD, IV drug abuse, LE edema, cellulitis, MRSA.  Sonographer:    Dustin Flock Referring Phys: [place]  1. Left ventricular ejection fraction, by estimation, is 55 to 60%. The left ventricle has normal function. The left ventricle has no regional wall motion abnormalities. Left ventricular diastolic parameters are consistent with Grade I diastolic dysfunction (impaired relaxation).  2. Right ventricular systolic function is normal. The right ventricular size is normal. Tricuspid regurgitation signal is inadequate for assessing PA pressure.  3. The mitral valve is normal in structure. Trivial mitral valve regurgitation. No evidence of mitral stenosis.  4. The aortic valve is tricuspid. Aortic valve regurgitation is not  visualized. No aortic stenosis is present.  5. The inferior vena cava is normal in size with greater than 50% respiratory variability, suggesting right atrial pressure of 3 mmHg. FINDINGS  Left Ventricle: Left ventricular ejection fraction, by estimation, is 55 to 60%. The left ventricle has normal function. The left ventricle has no regional wall motion abnormalities. The left ventricular internal cavity size was normal in size. There is  no left ventricular hypertrophy. Left ventricular diastolic parameters are consistent with Grade I diastolic dysfunction (impaired relaxation). Right Ventricle: The right ventricular size is normal. Right ventricular systolic function is normal. Tricuspid regurgitation signal is inadequate for assessing PA pressure. The tricuspid regurgitant velocity is 2.05 m/s, and with an assumed right atrial  pressure of 3 mmHg, the estimated right ventricular systolic pressure is [id number] mmHg. Left Atrium: Left atrial size was normal in size. Right Atrium: Right atrial size was normal in size. Pericardium: There is no evidence of pericardial effusion. Mitral Valve: The mitral valve is normal in structure. Mild mitral annular calcification. Trivial mitral valve regurgitation. No evidence of mitral valve stenosis. Tricuspid Valve: The tricuspid valve is normal in structure. Tricuspid valve regurgitation is trivial. No evidence of tricuspid stenosis. Aortic Valve: The aortic valve is tricuspid. Aortic valve regurgitation is not visualized. No aortic stenosis is present. Pulmonic Valve: The pulmonic valve was normal in structure. Pulmonic valve regurgitation is not visualized. No evidence of pulmonic stenosis. Aorta: The aortic root is normal in size and structure. Venous: The inferior vena cava is normal in size with greater than 50% respiratory variability, suggesting right atrial pressure of 3 mmHg. IAS/Shunts: No atrial level shunt detected by color flow Doppler.  LEFT VENTRICLE PLAX 2D LVIDd:          4.30 cm  Diastology LVIDs:         3.40 cm  LV e' medial:    6.96 cm/s LV PW:         1.00 cm  LV E/e' medial:  9.7 LV IVS:        1.10 cm  LV e' lateral:   9.25 cm/s LVOT diam:     2.10 cm  LV E/e' lateral: 7.3 LV SV:         97 LV SV Index:   45 LVOT Area:     3.46 cm  RIGHT VENTRICLE RV Basal diam:  3.80 cm RV S prime:     10.80 cm/s TAPSE (M-mode): 1.9 cm LEFT ATRIUM             Index       RIGHT ATRIUM           Index LA diam:        3.60 cm 1.66 cm/m  RA Area:     14.40 cm LA Vol (A2C):   57.7 ml 26.67 ml/m RA Volume:   37.10 ml  17.15 ml/m LA Vol (A4C):   38.2 ml 17.66 ml/m LA Biplane Vol: 47.3 ml 21.86 ml/m  AORTIC VALVE LVOT Vmax:   113.00 cm/s LVOT Vmean:  82.300 cm/s LVOT VTI:    0.280 m  AORTA Ao Root diam: 3.30 cm MITRAL VALVE               TRICUSPID VALVE MV Area (PHT): 2.63 cm    TR Peak grad:   16.8 mmHg MV Decel Time: 288 msec    TR Vmax:        205.00 cm/s MV E velocity: 67.70 cm/s MV A velocity: 80.60 cm/s  SHUNTS MV E/A ratio:  0.84        Systemic VTI:  0.28 m                            Systemic Diam: 2.10 cm Kirk Ruths MD Electronically signed by Kirk Ruths MD Signature Date/Time: 09/18/2020/1:16:09 PM    Final       Subjective: Patient seen and examined at bedside.  Poor historian.  Wants to go home today.  No overnight fever, vomiting, seizures reported.  Discharge Exam: Vitals:   09/19/20 0610 09/19/20 0922  BP: 136/76 (!) 142/92  Pulse: 76 68  Resp: 20 17  Temp: 98.6 F (  37 C) (!) 97.5 F (36.4 C)  SpO2: 98% 100%    General: Pt is alert, awake, not in acute distress.  Looks older than stated age and anxious. Cardiovascular: rate controlled, S1/S2 + Respiratory: bilateral decreased breath sounds at bases Abdominal: Soft, NT, ND, bowel sounds + Extremities: Trace lower extremity edema; no cyanosis    The results of significant diagnostics from this hospitalization (including imaging, microbiology, ancillary and laboratory) are listed below for  reference.     Microbiology: Recent Results (from the past 240 hour(s))  Culture, blood (Routine X 2) w Reflex to ID Panel     Status: Abnormal (Preliminary result)   Collection Time: 09/16/20  1:12 AM   Specimen: BLOOD LEFT FOREARM  Result Value Ref Range Status   Specimen Description   Final    BLOOD LEFT FOREARM Performed at [facility], [place] [address], [place], [place] [address]    Special Requests   Final    BOTTLES DRAWN AEROBIC AND ANAEROBIC Blood Culture adequate volume Performed at [facility], [address] [place], [place] [address]    Culture  Setup Time   Final    GRAM POSITIVE COCCI IN CLUSTERS IN BOTH AEROBIC AND ANAEROBIC BOTTLES CRITICAL VALUE NOTED.  VALUE IS CONSISTENT WITH PREVIOUSLY REPORTED AND CALLED VALUE. Performed at [facility], [place] [address], [place], [place] [address]    Culture STAPHYLOCOCCUS HOMINIS (A)  Final   Report Status PENDING  Incomplete  Culture, blood (Routine X 2) w Reflex to ID Panel     Status: Abnormal (Preliminary result)   Collection Time: 09/16/20  1:17 AM   Specimen: BLOOD  Result Value Ref Range Status   Specimen Description   Final    BLOOD RIGHT ANTECUBITAL Performed at [facility], [address] [place], [place] [address]    Special Requests   Final    BOTTLES DRAWN AEROBIC AND ANAEROBIC Blood Culture adequate volume Performed at [facility], [address] [place], [place] [address]    Culture  Setup Time   Final    GRAM POSITIVE COCCI IN CLUSTERS IN BOTH AEROBIC AND ANAEROBIC BOTTLES CRITICAL RESULT CALLED TO, READ BACK BY AND VERIFIED WITH: Trude Mcburney [id number] 09/17/2020 T. TYSOR    Culture (A)  Final    STAPHYLOCOCCUS EPIDERMIDIS CULTURE REINCUBATED FOR BETTER GROWTH Performed at [facility], [place] [address], [place], [place] [address]    Report Status PENDING  Incomplete  Blood Culture ID Panel (Reflexed)     Status:  Abnormal   Collection Time: 09/16/20  1:17 AM  Result Value Ref Range Status   Enterococcus faecalis NOT DETECTED NOT DETECTED Final   Enterococcus Faecium NOT DETECTED NOT DETECTED Final   Listeria monocytogenes NOT DETECTED NOT DETECTED Final   Staphylococcus species DETECTED (A) NOT DETECTED Final    Comment: CRITICAL RESULT CALLED TO, READ BACK BY AND VERIFIED WITH: M. Paul Dykes [id number] 09/17/2020 T. TYSOR    Staphylococcus aureus (BCID) NOT DETECTED NOT DETECTED Final   Staphylococcus epidermidis DETECTED (A) NOT DETECTED Final    Comment: Methicillin (oxacillin) resistant coagulase negative staphylococcus. Possible blood culture contaminant (unless isolated from more than one blood culture draw or clinical case suggests pathogenicity). No antibiotic treatment is indicated for blood  culture contaminants. CRITICAL RESULT CALLED TO, READ BACK BY AND VERIFIED WITH: M. Paul Dykes [id number] 09/17/2020 T. TYSOR    Staphylococcus lugdunensis NOT DETECTED NOT DETECTED Final   Streptococcus species NOT DETECTED  NOT DETECTED Final   Streptococcus agalactiae NOT DETECTED NOT DETECTED Final   Streptococcus pneumoniae NOT DETECTED NOT DETECTED Final   Streptococcus pyogenes NOT DETECTED NOT DETECTED Final   A.calcoaceticus-baumannii NOT DETECTED NOT DETECTED Final   Bacteroides fragilis NOT DETECTED NOT DETECTED Final   Enterobacterales NOT DETECTED NOT DETECTED Final   Enterobacter cloacae complex NOT DETECTED NOT DETECTED Final   Escherichia coli NOT DETECTED NOT DETECTED Final   Klebsiella aerogenes NOT DETECTED NOT DETECTED Final   Klebsiella oxytoca NOT DETECTED NOT DETECTED Final   Klebsiella pneumoniae NOT DETECTED NOT DETECTED Final   Proteus species NOT DETECTED NOT DETECTED Final   Salmonella species NOT DETECTED NOT DETECTED Final   Serratia marcescens NOT DETECTED NOT DETECTED Final   Haemophilus influenzae NOT DETECTED NOT DETECTED Final   Neisseria meningitidis NOT DETECTED  NOT DETECTED Final   Pseudomonas aeruginosa NOT DETECTED NOT DETECTED Final   Stenotrophomonas maltophilia NOT DETECTED NOT DETECTED Final   Candida albicans NOT DETECTED NOT DETECTED Final   Candida auris NOT DETECTED NOT DETECTED Final   Candida glabrata NOT DETECTED NOT DETECTED Final   Candida krusei NOT DETECTED NOT DETECTED Final   Candida parapsilosis NOT DETECTED NOT DETECTED Final   Candida tropicalis NOT DETECTED NOT DETECTED Final   Cryptococcus neoformans/gattii NOT DETECTED NOT DETECTED Final   Methicillin resistance mecA/C DETECTED (A) NOT DETECTED Final    Comment: CRITICAL RESULT CALLED TO, READ BACK BY AND VERIFIED WITH: Trude Mcburney [id number] 09/17/2020 Mena Goes Performed at [facility], [address] [place], [place] [address]   Resp Panel by RT-PCR (Flu A&B, Covid) Nasopharyngeal Swab     Status: None   Collection Time: 09/16/20  4:31 AM   Specimen: Nasopharyngeal Swab; Nasopharyngeal(NP) swabs in vial transport medium  Result Value Ref Range Status   SARS Coronavirus 2 by RT PCR NEGATIVE NEGATIVE Final    Comment: (NOTE) SARS-CoV-2 target nucleic acids are NOT DETECTED.  The SARS-CoV-2 RNA is generally detectable in upper respiratory specimens during the acute phase of infection. The lowest concentration of SARS-CoV-2 viral copies this assay can detect is 138 copies/mL. A negative result does not preclude SARS-Cov-2 infection and should not be used as the sole basis for treatment or other patient management decisions. A negative result may occur with  improper specimen collection/handling, submission of specimen other than nasopharyngeal swab, presence of viral mutation(s) within the areas targeted by this assay, and inadequate number of viral copies(<138 copies/mL). A negative result must be combined with clinical observations, patient history, and epidemiological information. The expected result is Negative.  Fact Sheet for Patients:   EntrepreneurPulse.com.au  Fact Sheet for Healthcare Providers:  IncredibleEmployment.be  This test is no t yet approved or cleared by the [place] FDA and  has been authorized for detection and/or diagnosis of SARS-CoV-2 by FDA under an Emergency Use Authorization (EUA). This EUA will remain  in effect (meaning this test can be used) for the duration of the COVID-19 declaration under Section 564(b)(1) of the Act, 21 U.S.C.section 360bbb-3(b)(1), unless the authorization is terminated  or revoked sooner.       Influenza A by PCR NEGATIVE NEGATIVE Final   Influenza B by PCR NEGATIVE NEGATIVE Final    Comment: (NOTE) The Xpert Xpress SARS-CoV-2/FLU/RSV plus assay is intended as an aid in the diagnosis of influenza from Nasopharyngeal swab specimens and should not be used as a sole basis for treatment. Nasal washings and aspirates are unacceptable for Xpert Xpress  SARS-CoV-2/FLU/RSV testing.  Fact Sheet for Patients: EntrepreneurPulse.com.au  Fact Sheet for Healthcare Providers: IncredibleEmployment.be  This test is not yet approved or cleared by the [place] FDA and has been authorized for detection and/or diagnosis of SARS-CoV-2 by FDA under an Emergency Use Authorization (EUA). This EUA will remain in effect (meaning this test can be used) for the duration of the COVID-19 declaration under Section 564(b)(1) of the Act, 21 U.S.C. section 360bbb-3(b)(1), unless the authorization is terminated or revoked.  Performed at [facility], [address], [place], [place] [address]      Labs: BNP (last 3 results) Recent Labs    09/17/20 0747  BNP 0000000   Basic Metabolic Panel: Recent Labs  Lab 09/16/20 0112 09/17/20 0747  NA 137 136  K 3.8 4.0  CL 101 101  CO2 28 27  GLUCOSE 104* 97  BUN 18 15  CREATININE 1.06 1.09  CALCIUM 8.6* 8.9   Liver Function Tests: Recent  Labs  Lab 09/16/20 0112  AST 13*  ALT 13  ALKPHOS 85  BILITOT 0.2*  PROT 6.9  ALBUMIN 3.4*   No results for input(s): LIPASE, AMYLASE in the last 168 hours. No results for input(s): AMMONIA in the last 168 hours. CBC: Recent Labs  Lab 09/16/20 0112 09/17/20 0747  WBC 6.7 5.3  NEUTROABS 4.5  --   HGB 11.4* 12.1*  HCT 34.8* 36.8*  MCV 83.9 83.3  PLT 279 301   Cardiac Enzymes: No results for input(s): CKTOTAL, CKMB, CKMBINDEX, TROPONINI in the last 168 hours. BNP: Invalid input(s): POCBNP CBG: No results for input(s): GLUCAP in the last 168 hours. D-Dimer No results for input(s): DDIMER in the last 72 hours. Hgb A1c No results for input(s): HGBA1C in the last 72 hours. Lipid Profile No results for input(s): CHOL, HDL, LDLCALC, TRIG, CHOLHDL, LDLDIRECT in the last 72 hours. Thyroid function studies No results for input(s): TSH, T4TOTAL, T3FREE, THYROIDAB in the last 72 hours.  Invalid input(s): FREET3 Anemia work up No results for input(s): VITAMINB12, FOLATE, FERRITIN, TIBC, IRON, RETICCTPCT in the last 72 hours. Urinalysis    Component Value Date/Time   COLORURINE YELLOW 08/18/2019 0030   APPEARANCEUR CLEAR 08/18/2019 0030   LABSPEC 1.025 08/18/2019 0030   PHURINE 5.0 08/18/2019 0030   GLUCOSEU NEGATIVE 08/18/2019 0030   HGBUR NEGATIVE 08/18/2019 0030   BILIRUBINUR NEGATIVE 08/18/2019 0030   KETONESUR NEGATIVE 08/18/2019 0030   PROTEINUR NEGATIVE 08/18/2019 0030   UROBILINOGEN 0.2 12/01/2008 0412   NITRITE NEGATIVE 08/18/2019 0030   LEUKOCYTESUR TRACE (A) 08/18/2019 0030   Sepsis Labs Invalid input(s): PROCALCITONIN,  WBC,  LACTICIDVEN Microbiology Recent Results (from the past 240 hour(s))  Culture, blood (Routine X 2) w Reflex to ID Panel     Status: Abnormal (Preliminary result)   Collection Time: 09/16/20  1:12 AM   Specimen: BLOOD LEFT FOREARM  Result Value Ref Range Status   Specimen Description   Final    BLOOD LEFT FOREARM Performed at [facility], [place] [address] [place], [place] [address]    Special Requests   Final    BOTTLES DRAWN AEROBIC AND ANAEROBIC Blood Culture adequate volume Performed at [facility], [address] [place], [place] [address]    Culture  Setup Time   Final    GRAM POSITIVE COCCI IN CLUSTERS IN BOTH AEROBIC AND ANAEROBIC BOTTLES CRITICAL VALUE NOTED.  VALUE IS CONSISTENT WITH PREVIOUSLY REPORTED AND CALLED VALUE. Performed at [facility], [address] [place], [place] [address]    Culture STAPHYLOCOCCUS HOMINIS (A)  Final   Report Status PENDING  Incomplete  Culture, blood (Routine X 2) w Reflex to ID Panel     Status: Abnormal (Preliminary result)   Collection Time: 09/16/20  1:17 AM   Specimen: BLOOD  Result Value Ref Range Status   Specimen Description   Final    BLOOD RIGHT ANTECUBITAL Performed at [facility], [address], [place], [place] [address]    Special Requests   Final    BOTTLES DRAWN AEROBIC AND ANAEROBIC Blood Culture adequate volume Performed at [facility] [facility], [address] [place], [place] [address]    Culture  Setup Time   Final    GRAM POSITIVE COCCI IN CLUSTERS IN BOTH AEROBIC AND ANAEROBIC BOTTLES CRITICAL RESULT CALLED TO, READ BACK BY AND VERIFIED WITH: Trude Mcburney [id number] 09/17/2020 T. TYSOR    Culture (A)  Final    STAPHYLOCOCCUS EPIDERMIDIS CULTURE REINCUBATED FOR BETTER GROWTH Performed at [facility], [place] [address] [place], [place] [address]    Report Status PENDING  Incomplete  Blood Culture ID Panel (Reflexed)     Status: Abnormal   Collection Time: 09/16/20  1:17 AM  Result Value Ref Range Status   Enterococcus faecalis NOT DETECTED NOT DETECTED Final   Enterococcus Faecium NOT DETECTED NOT DETECTED Final   Listeria monocytogenes NOT DETECTED NOT DETECTED Final   Staphylococcus species DETECTED (A) NOT DETECTED Final    Comment: CRITICAL RESULT CALLED TO, READ  BACK BY AND VERIFIED WITH: M. Paul Dykes [id number] 09/17/2020 T. TYSOR    Staphylococcus aureus (BCID) NOT DETECTED NOT DETECTED Final   Staphylococcus epidermidis DETECTED (A) NOT DETECTED Final    Comment: Methicillin (oxacillin) resistant coagulase negative staphylococcus. Possible blood culture contaminant (unless isolated from more than one blood culture draw or clinical case suggests pathogenicity). No antibiotic treatment is indicated for blood  culture contaminants. CRITICAL RESULT CALLED TO, READ BACK BY AND VERIFIED WITH: M. Paul Dykes [id number] 09/17/2020 T. TYSOR    Staphylococcus lugdunensis NOT DETECTED NOT DETECTED Final   Streptococcus species NOT DETECTED NOT DETECTED Final   Streptococcus agalactiae NOT DETECTED NOT DETECTED Final   Streptococcus pneumoniae NOT DETECTED NOT DETECTED Final   Streptococcus pyogenes NOT DETECTED NOT DETECTED Final   A.calcoaceticus-baumannii NOT DETECTED NOT DETECTED Final   Bacteroides fragilis NOT DETECTED NOT DETECTED Final   Enterobacterales NOT DETECTED NOT DETECTED Final   Enterobacter cloacae complex NOT DETECTED NOT DETECTED Final   Escherichia coli NOT DETECTED NOT DETECTED Final   Klebsiella aerogenes NOT DETECTED NOT DETECTED Final   Klebsiella oxytoca NOT DETECTED NOT DETECTED Final   Klebsiella pneumoniae NOT DETECTED NOT DETECTED Final   Proteus species NOT DETECTED NOT DETECTED Final   Salmonella species NOT DETECTED NOT DETECTED Final   Serratia marcescens NOT DETECTED NOT DETECTED Final   Haemophilus influenzae NOT DETECTED NOT DETECTED Final   Neisseria meningitidis NOT DETECTED NOT DETECTED Final   Pseudomonas aeruginosa NOT DETECTED NOT DETECTED Final   Stenotrophomonas maltophilia NOT DETECTED NOT DETECTED Final   Candida albicans NOT DETECTED NOT DETECTED Final   Candida auris NOT DETECTED NOT DETECTED Final   Candida glabrata NOT DETECTED NOT DETECTED Final   Candida krusei NOT DETECTED NOT DETECTED Final   Candida  parapsilosis NOT DETECTED NOT DETECTED Final   Candida tropicalis NOT DETECTED NOT DETECTED Final   Cryptococcus neoformans/gattii NOT DETECTED NOT DETECTED Final   Methicillin resistance mecA/C  DETECTED (A) NOT DETECTED Final    Comment: CRITICAL RESULT CALLED TO, READ BACK BY AND VERIFIED WITH: Trude Mcburney [id number] 09/17/2020 Mena Goes Performed at [facility], [place] [address], [place], [place] [address]   Resp Panel by RT-PCR (Flu A&B, Covid) Nasopharyngeal Swab     Status: None   Collection Time: 09/16/20  4:31 AM   Specimen: Nasopharyngeal Swab; Nasopharyngeal(NP) swabs in vial transport medium  Result Value Ref Range Status   SARS Coronavirus 2 by RT PCR NEGATIVE NEGATIVE Final    Comment: (NOTE) SARS-CoV-2 target nucleic acids are NOT DETECTED.  The SARS-CoV-2 RNA is generally detectable in upper respiratory specimens during the acute phase of infection. The lowest concentration of SARS-CoV-2 viral copies this assay can detect is 138 copies/mL. A negative result does not preclude SARS-Cov-2 infection and should not be used as the sole basis for treatment or other patient management decisions. A negative result may occur with  improper specimen collection/handling, submission of specimen other than nasopharyngeal swab, presence of viral mutation(s) within the areas targeted by this assay, and inadequate number of viral copies(<138 copies/mL). A negative result must be combined with clinical observations, patient history, and epidemiological information. The expected result is Negative.  Fact Sheet for Patients:  EntrepreneurPulse.com.au  Fact Sheet for Healthcare Providers:  IncredibleEmployment.be  This test is no t yet approved or cleared by the [place] FDA and  has been authorized for detection and/or diagnosis of SARS-CoV-2 by FDA under an Emergency Use Authorization (EUA). This EUA will remain  in effect (meaning this  test can be used) for the duration of the COVID-19 declaration under Section 564(b)(1) of the Act, 21 U.S.C.section 360bbb-3(b)(1), unless the authorization is terminated  or revoked sooner.       Influenza A by PCR NEGATIVE NEGATIVE Final   Influenza B by PCR NEGATIVE NEGATIVE Final    Comment: (NOTE) The Xpert Xpress SARS-CoV-2/FLU/RSV plus assay is intended as an aid in the diagnosis of influenza from Nasopharyngeal swab specimens and should not be used as a sole basis for treatment. Nasal washings and aspirates are unacceptable for Xpert Xpress SARS-CoV-2/FLU/RSV testing.  Fact Sheet for Patients: EntrepreneurPulse.com.au  Fact Sheet for Healthcare Providers: IncredibleEmployment.be  This test is not yet approved or cleared by the [place] FDA and has been authorized for detection and/or diagnosis of SARS-CoV-2 by FDA under an Emergency Use Authorization (EUA). This EUA will remain in effect (meaning this test can be used) for the duration of the COVID-19 declaration under Section 564(b)(1) of the Act, 21 U.S.C. section 360bbb-3(b)(1), unless the authorization is terminated or revoked.  Performed at [facility], [address], [place], [place] [address]      Time coordinating discharge: 35 minutes  SIGNED:   Aline August, MD  [facility] 09/20/2020, 9:49 AM

## 2020-09-20 NOTE — Progress Notes (Signed)
Patient has refused vital signs and assessments this shift.Patient verbalized that he just wants to be discharged home.

## 2020-09-20 NOTE — Progress Notes (Signed)
When in to check on patient ask what does he need. He states nothing just quiet.

## 2020-09-21 LAB — CULTURE, BLOOD (ROUTINE X 2)
Special Requests: ADEQUATE
Special Requests: ADEQUATE

## 2020-10-28 ENCOUNTER — Other Ambulatory Visit: Payer: Self-pay

## 2020-10-28 ENCOUNTER — Encounter (HOSPITAL_BASED_OUTPATIENT_CLINIC_OR_DEPARTMENT_OTHER): Payer: Self-pay | Admitting: Emergency Medicine

## 2020-10-28 ENCOUNTER — Emergency Department (HOSPITAL_BASED_OUTPATIENT_CLINIC_OR_DEPARTMENT_OTHER)
Admission: EM | Admit: 2020-10-28 | Discharge: 2020-10-28 | Disposition: A | Discharge status: Home / Self Care | Payer: 59 | Attending: Emergency Medicine | Admitting: Emergency Medicine

## 2020-10-28 DIAGNOSIS — F17210 Nicotine dependence, cigarettes, uncomplicated: Secondary | ICD-10-CM | POA: Insufficient documentation

## 2020-10-28 DIAGNOSIS — R0989 Other specified symptoms and signs involving the circulatory and respiratory systems: Secondary | ICD-10-CM | POA: Insufficient documentation

## 2020-10-28 DIAGNOSIS — L98491 Non-pressure chronic ulcer of skin of other sites limited to breakdown of skin: Secondary | ICD-10-CM | POA: Insufficient documentation

## 2020-10-28 DIAGNOSIS — R600 Localized edema: Secondary | ICD-10-CM | POA: Diagnosis present

## 2020-10-28 DIAGNOSIS — I10 Essential (primary) hypertension: Secondary | ICD-10-CM | POA: Diagnosis not present

## 2020-10-28 LAB — COMPREHENSIVE METABOLIC PANEL
ALT: 12 U/L (ref 0–44)
AST: 15 U/L (ref 15–41)
Albumin: 3.7 g/dL (ref 3.5–5.0)
Alkaline Phosphatase: 77 U/L (ref 38–126)
Anion gap: 6 (ref 5–15)
BUN: 22 mg/dL — ABNORMAL HIGH (ref 6–20)
CO2: 29 mmol/L (ref 22–32)
Calcium: 8.9 mg/dL (ref 8.9–10.3)
Chloride: 106 mmol/L (ref 98–111)
Creatinine, Ser: 1.14 mg/dL (ref 0.61–1.24)
GFR, Estimated: >60 mL/min (ref >60)
Glucose, Bld: 115 mg/dL — ABNORMAL HIGH (ref 70–99)
Potassium: 3.9 mmol/L (ref 3.5–5.1)
Sodium: 141 mmol/L (ref 135–145)
Total Bilirubin: 0.2 mg/dL — ABNORMAL LOW (ref 0.3–1.2)
Total Protein: 7 g/dL (ref 6.5–8.1)

## 2020-10-28 LAB — CBC WITH DIFFERENTIAL/PLATELET
Abs Immature Granulocytes: 0 10*3/uL (ref 0.00–0.07)
Basophils Absolute: 0 10*3/uL (ref 0.0–0.1)
Basophils Relative: 0 %
Eosinophils Absolute: 0.1 10*3/uL (ref 0.0–0.5)
Eosinophils Relative: 2 %
HCT: 35 % — ABNORMAL LOW (ref 39.0–52.0)
Hemoglobin: 11.4 g/dL — ABNORMAL LOW (ref 13.0–17.0)
Immature Granulocytes: 0 %
Lymphocytes Relative: 27 %
Lymphs Abs: 1.5 10*3/uL (ref 0.7–4.0)
MCH: 27.1 pg (ref 26.0–34.0)
MCHC: 32.6 g/dL (ref 30.0–36.0)
MCV: 83.1 fL (ref 80.0–100.0)
Monocytes Absolute: 0.5 10*3/uL (ref 0.1–1.0)
Monocytes Relative: 8 %
Neutro Abs: 3.5 10*3/uL (ref 1.7–7.7)
Neutrophils Relative %: 63 %
Platelets: 251 10*3/uL (ref 150–400)
RBC: 4.21 MIL/uL — ABNORMAL LOW (ref 4.22–5.81)
RDW: 13.9 % (ref 11.5–15.5)
WBC: 5.6 10*3/uL (ref 4.0–10.5)
nRBC: 0 % (ref 0.0–0.2)

## 2020-10-28 LAB — PROCALCITONIN: Procalcitonin: <0.1 ng/mL

## 2020-10-28 LAB — D-DIMER, QUANTITATIVE: D-Dimer, Quant: 0.9 ug/mL-FEU — ABNORMAL HIGH (ref 0.00–0.50)

## 2020-10-28 MED ORDER — ENOXAPARIN SODIUM 100 MG/ML IJ SOSY
1.0000 mg/kg | PREFILLED_SYRINGE | Freq: Once | INTRAMUSCULAR | Status: AC
  Administered 2020-10-28: 95 mg via SUBCUTANEOUS
  Filled 2020-10-28: qty 1

## 2020-10-28 NOTE — ED Triage Notes (Addendum)
Pt presents to ED POV. Pt c/o L leg swelling and pain. Pt admitted 8/5 for cellulitis. Pt reports that it has been slowly worseinging since d/c. Does not take home meds compliantly

## 2020-10-28 NOTE — ED Provider Notes (Signed)
[facility] Provider Note: Georgena Spurling, MD, FACEP  CSN: [id number] MRN: [id number] ARRIVAL: 10/28/20 at [facility]: [id number]   CHIEF COMPLAINT  Leg Swelling   HISTORY OF PRESENT ILLNESS  10/28/20 1:16 AM [name] is a 50 y.o. male who was admitted for cellulitis of the right lower extremity on 09/15/2020.  This was thought possibly secondary to IV drug use.  The patient, however, recalls that it was his left leg that was involved.  He is here with acute swelling of his left lower extremity.  He states he has had intermittent swelling and pain (in various locations in his left lower extremity) since discharge.  He had been managing it himself until the left lower extremity became severely edematous with moderate pain.  At rest he rates his pain as a 4 out of 10 but rates it as a 6 out of 10 when he attempts to ambulate.  The pain is vaguely described as having aching and electric-like components.  Again it continues to be nonfocal and migratory.  He denies any fever or chills.  There is no erythema of that leg.  He admits to heroin use as recently as yesterday but states he snorts it or sometimes injects it into his buttocks but no longer injects into the veins.  He also admits to amphetamine use.  He has poorly healing ulcer of the right wrist which she states has been there for a year.   Past Medical History:  Diagnosis Date   Anxiety    Chronic back pain    Depression    History of diverticulosis    History of kidney stones    Hypertension    Kidney stones    MRSA (methicillin resistant staph aureus) culture positive    Substance abuse ([place])    former abuser methodone and oxycodone    Past Surgical History:  Procedure Laterality Date   I&D of right arm abscess  11/14/2003   kidney stones      Family History  Problem Relation Age of Onset   Hypertension Mother    Cancer Maternal Grandmother        ovarian   Diabetes Paternal Grandfather    Coronary  artery disease Father     Social History   Tobacco Use   Smoking status: Light Smoker    Packs/day: 0.00    Years: 26.00    Pack years: 0.00    Types: Cigarettes    Last attempt to quit: 04/05/2015    Years since quitting: 5.5   Smokeless tobacco: Never  Vaping Use   Vaping Use: Never used  Substance Use Topics   Alcohol use: Yes    Alcohol/week: 0.0 standard drinks    Comment: 3 times per week   Drug use: Yes    Comment: heroin daily    Prior to Admission medications   Medication Sig Start Date End Date Taking? Authorizing Provider  alprazolam Duanne Moron) 2 MG tablet TAKE 1 TABLET BY MOUTH 3 TIMES DAILY AS NEEDED FOR ANXIETY 12/21/15   Weber, Damaris Hippo, PA-C  amphetamine-dextroamphetamine (ADDERALL) 20 MG tablet Take 20 mg by mouth 3 (three) times daily. 01/31/14   [provider]    Allergies Codeine and Tylenol [acetaminophen]   REVIEW OF SYSTEMS  Negative except as noted here or in the History of Present Illness.   PHYSICAL EXAMINATION  Initial Vital Signs Blood pressure (!) 160/103, pulse 98, temperature 98.2 F (36.8 C), temperature source Oral, resp. rate  18, height '5\' 10"'$  (1.778 m), weight 95.3 kg, SpO2 97 %.  Examination General: Well-developed, well-nourished male in no acute distress; appearance consistent with age of record HENT: normocephalic; atraumatic Eyes: pupils equal, round and reactive to light; extraocular muscles intact Neck: supple Heart: regular rate and rhythm; no murmur Lungs: clear to auscultation bilaterally Abdomen: soft; nondistended; nontender; bowel sounds present Extremities: No bony deformity; tenderness and profound edema of left lower extremity without erythema or warmth, pulses normal:    Neurologic: Awake, alert and oriented; motor function intact in all extremities and symmetric; no facial droop Skin: Warm and dry; multiple scattered scars and wounds of various stages of healing; chronic appearing ulcer of right  wrist:    Psychiatric: Normal mood and affect; polite and affable   RESULTS  Summary of this visit's results, reviewed and interpreted by myself:   EKG Interpretation  Date/Time:    Ventricular Rate:    PR Interval:    QRS Duration:   QT Interval:    QTC Calculation:   R Axis:     Text Interpretation:         Laboratory Studies: Results for orders placed or performed during the hospital encounter of 10/28/20 (from the past 24 hour(s))  CBC with Differential/Platelet     Status: Abnormal   Collection Time: 10/28/20  2:17 AM  Result Value Ref Range   WBC 5.6 4.0 - 10.5 K/uL   RBC 4.21 (L) 4.22 - 5.81 MIL/uL   Hemoglobin 11.4 (L) 13.0 - 17.0 g/dL   HCT 35.0 (L) 39.0 - 52.0 %   MCV 83.1 80.0 - 100.0 fL   MCH 27.1 26.0 - 34.0 pg   MCHC 32.6 30.0 - 36.0 g/dL   RDW 13.9 11.5 - 15.5 %   Platelets 251 150 - 400 K/uL   nRBC 0.0 0.0 - 0.2 %   Neutrophils Relative % 63 %   Neutro Abs 3.5 1.7 - 7.7 K/uL   Lymphocytes Relative 27 %   Lymphs Abs 1.5 0.7 - 4.0 K/uL   Monocytes Relative 8 %   Monocytes Absolute 0.5 0.1 - 1.0 K/uL   Eosinophils Relative 2 %   Eosinophils Absolute 0.1 0.0 - 0.5 K/uL   Basophils Relative 0 %   Basophils Absolute 0.0 0.0 - 0.1 K/uL   Immature Granulocytes 0 %   Abs Immature Granulocytes 0.00 0.00 - 0.07 K/uL  Comprehensive metabolic panel     Status: Abnormal   Collection Time: 10/28/20  2:17 AM  Result Value Ref Range   Sodium 141 135 - 145 mmol/L   Potassium 3.9 3.5 - 5.1 mmol/L   Chloride 106 98 - 111 mmol/L   CO2 29 22 - 32 mmol/L   Glucose, Bld 115 (H) 70 - 99 mg/dL   BUN 22 (H) 6 - 20 mg/dL   Creatinine, Ser 1.14 0.61 - 1.24 mg/dL   Calcium 8.9 8.9 - 10.3 mg/dL   Total Protein 7.0 6.5 - 8.1 g/dL   Albumin 3.7 3.5 - 5.0 g/dL   AST 15 15 - 41 U/L   ALT 12 0 - 44 U/L   Alkaline Phosphatase 77 38 - 126 U/L   Total Bilirubin 0.2 (L) 0.3 - 1.2 mg/dL   GFR, Estimated >60 >60 mL/min   Anion gap 6 5 - 15  D-dimer, quantitative      Status: Abnormal   Collection Time: 10/28/20  2:17 AM  Result Value Ref Range   D-Dimer, Quant 0.90 (H) 0.00 -  0.50 ug/mL-FEU   Imaging Studies: No results found.  ED COURSE and MDM  Nursing notes, initial and subsequent vitals signs, including pulse oximetry, reviewed and interpreted by myself.  Vitals:   10/28/20 0110 10/28/20 0113 10/28/20 0313  BP:  (!) 160/103 (!) 141/82  Pulse:  98 93  Resp:  18 20  Temp:  98.2 F (36.8 C)   TempSrc:  Oral   SpO2:  97% 98%  Weight: 95.3 kg    Height: '5\' 10"'$  (1.778 m)     Medications  enoxaparin (LOVENOX) injection 95 mg (has no administration in time range)    The patient's leg does not appear infected (cellulitis) on exam and he does not have a leukocytosis or fever.  There is a concern for DVT.  We will give him a shot of Lovenox and have him return later today as we do not have ultrasound until after 11 AM.  PROCEDURES  Procedures   ED DIAGNOSES     ICD-10-CM   1. Suspected DVT (deep vein thrombosis)  R09.89     2. Chronic skin ulcer, limited to breakdown of skin ([place])  L98.491          Shanon Rosser, MD 10/28/20 [phone]

## 2020-10-29 ENCOUNTER — Emergency Department (HOSPITAL_COMMUNITY): Payer: 59

## 2020-10-29 ENCOUNTER — Other Ambulatory Visit: Payer: Self-pay

## 2020-10-29 ENCOUNTER — Encounter (HOSPITAL_COMMUNITY): Payer: Self-pay

## 2020-10-29 ENCOUNTER — Emergency Department (HOSPITAL_COMMUNITY)
Admission: EM | Admit: 2020-10-29 | Discharge: 2020-10-29 | Disposition: A | Discharge status: Home / Self Care | Payer: 59 | Attending: Emergency Medicine | Admitting: Emergency Medicine

## 2020-10-29 DIAGNOSIS — M25472 Effusion, left ankle: Secondary | ICD-10-CM | POA: Insufficient documentation

## 2020-10-29 DIAGNOSIS — I10 Essential (primary) hypertension: Secondary | ICD-10-CM | POA: Diagnosis not present

## 2020-10-29 DIAGNOSIS — M25572 Pain in left ankle and joints of left foot: Secondary | ICD-10-CM

## 2020-10-29 DIAGNOSIS — M7989 Other specified soft tissue disorders: Secondary | ICD-10-CM | POA: Diagnosis not present

## 2020-10-29 DIAGNOSIS — F17210 Nicotine dependence, cigarettes, uncomplicated: Secondary | ICD-10-CM | POA: Insufficient documentation

## 2020-10-29 DIAGNOSIS — M25475 Effusion, left foot: Secondary | ICD-10-CM | POA: Diagnosis not present

## 2020-10-29 DIAGNOSIS — R609 Edema, unspecified: Secondary | ICD-10-CM

## 2020-10-29 LAB — CBC WITH DIFFERENTIAL/PLATELET
Abs Immature Granulocytes: 0.01 10*3/uL (ref 0.00–0.07)
Basophils Absolute: 0 10*3/uL (ref 0.0–0.1)
Basophils Relative: 0 %
Eosinophils Absolute: 0.1 10*3/uL (ref 0.0–0.5)
Eosinophils Relative: 2 %
HCT: 35.9 % — ABNORMAL LOW (ref 39.0–52.0)
Hemoglobin: 11.8 g/dL — ABNORMAL LOW (ref 13.0–17.0)
Immature Granulocytes: 0 %
Lymphocytes Relative: 24 %
Lymphs Abs: 1.4 10*3/uL (ref 0.7–4.0)
MCH: 27.8 pg (ref 26.0–34.0)
MCHC: 32.9 g/dL (ref 30.0–36.0)
MCV: 84.5 fL (ref 80.0–100.0)
Monocytes Absolute: 0.5 10*3/uL (ref 0.1–1.0)
Monocytes Relative: 9 %
Neutro Abs: 3.9 10*3/uL (ref 1.7–7.7)
Neutrophils Relative %: 65 %
Platelets: 251 10*3/uL (ref 150–400)
RBC: 4.25 MIL/uL (ref 4.22–5.81)
RDW: 14.1 % (ref 11.5–15.5)
WBC: 6 10*3/uL (ref 4.0–10.5)
nRBC: 0 % (ref 0.0–0.2)

## 2020-10-29 LAB — COMPREHENSIVE METABOLIC PANEL
ALT: 16 U/L (ref 0–44)
AST: 19 U/L (ref 15–41)
Albumin: 3.5 g/dL (ref 3.5–5.0)
Alkaline Phosphatase: 84 U/L (ref 38–126)
Anion gap: 6 (ref 5–15)
BUN: 17 mg/dL (ref 6–20)
CO2: 27 mmol/L (ref 22–32)
Calcium: 8.7 mg/dL — ABNORMAL LOW (ref 8.9–10.3)
Chloride: 106 mmol/L (ref 98–111)
Creatinine, Ser: 0.89 mg/dL (ref 0.61–1.24)
GFR, Estimated: >60 mL/min (ref >60)
Glucose, Bld: 96 mg/dL (ref 70–99)
Potassium: 3.9 mmol/L (ref 3.5–5.1)
Sodium: 139 mmol/L (ref 135–145)
Total Bilirubin: 0.2 mg/dL — ABNORMAL LOW (ref 0.3–1.2)
Total Protein: 7.1 g/dL (ref 6.5–8.1)

## 2020-10-29 LAB — CK: Total CK: 142 U/L (ref 49–397)

## 2020-10-29 MED ORDER — KETOROLAC TROMETHAMINE 15 MG/ML IJ SOLN
15.0000 mg | Freq: Once | INTRAMUSCULAR | Status: AC
  Administered 2020-10-29: 15 mg via INTRAMUSCULAR
  Filled 2020-10-29: qty 1

## 2020-10-29 NOTE — ED Triage Notes (Signed)
Pt c/o pain and swelling to left ankle and leg. Reports he was seen at [facility] yesterday for the same. Denies injury. No cp or sob. Pt had an elevated D-dimer yesterday and reports that they wanted to obtain an [place] of his leg to rule out a dvt but [place] was not available.

## 2020-10-29 NOTE — ED Provider Notes (Addendum)
[facility] DEPT Provider Note   CSN: [id number] Arrival date & time: 10/29/20  [id number]     History Chief Complaint  Patient presents with   Ankle Pain   Joint Swelling    [name] is a 50 y.o. male.  50 year old male with a past medical history of anxiety, MRSA, substance abuse presents to the ED with a chief complaint of left ankle and left leg swelling.  Previously evaluated eyedropper H yesterday, received a shot of Lovenox and was to be seen in the ER today for an ultrasound of his leg to rule out DVT.  He reports taking Tylenol for pain control without much improvement in symptoms.  Patient does report he currently lives and cares at home for himself.  He has had no trauma, no fever, no chest pain or shortness of breath.   The history is provided by the patient and medical records.  Ankle Pain Location:  Ankle Time since incident:  1 week Injury: no   Ankle location:  L ankle Pain details:    Radiates to:  Does not radiate   Severity:  Mild   Timing:  Constant   Progression:  Unchanged Chronicity:  New Associated symptoms: no back pain and no fever       Past Medical History:  Diagnosis Date   Anxiety    Chronic back pain    Depression    History of diverticulosis    History of kidney stones    Hypertension    Kidney stones    MRSA (methicillin resistant staph aureus) culture positive    Substance abuse ([place])    former abuser methodone and oxycodone    Patient Active Problem List   Diagnosis Date Noted   Bacteremia due to methicillin resistant Staphylococcus epidermidis 09/17/2020   Hypoalbuminemia 09/16/2020   Subclinical hypothyroidism 09/16/2020   Lower extremity edema 09/16/2020   Lumbar back pain 09/16/2020   Cellulitis 06/09/2018   IVDU (intravenous drug user) 06/09/2018   ADD (attention deficit disorder) 02/02/2015   Insomnia 04/08/2012   GAD (generalized anxiety disorder) 11/02/2011   Chronic back pain  11/02/2011    Past Surgical History:  Procedure Laterality Date   I&D of right arm abscess  11/14/2003   kidney stones         Family History  Problem Relation Age of Onset   Hypertension Mother    Cancer Maternal Grandmother        ovarian   Diabetes Paternal Grandfather    Coronary artery disease Father     Social History   Tobacco Use   Smoking status: Light Smoker    Packs/day: 0.00    Years: 26.00    Pack years: 0.00    Types: Cigarettes    Last attempt to quit: 04/05/2015    Years since quitting: 5.5   Smokeless tobacco: Never  Vaping Use   Vaping Use: Never used  Substance Use Topics   Alcohol use: Yes    Alcohol/week: 0.0 standard drinks    Comment: 3 times per week   Drug use: Yes    Comment: heroin daily    Home Medications Prior to Admission medications   Medication Sig Start Date End Date Taking? Authorizing Provider  alprazolam Duanne Moron) 2 MG tablet TAKE 1 TABLET BY MOUTH 3 TIMES DAILY AS NEEDED FOR ANXIETY 12/21/15   Weber, Damaris Hippo, PA-C  amphetamine-dextroamphetamine (ADDERALL) 20 MG tablet Take 20 mg by mouth 3 (three) times daily. 01/31/14  [provider]    Allergies    Codeine and Tylenol [acetaminophen]  Review of Systems   Review of Systems  Constitutional:  Negative for chills and fever.  HENT:  Negative for sore throat.   Respiratory:  Negative for shortness of breath.   Cardiovascular:  Positive for leg swelling. Negative for chest pain.  Gastrointestinal:  Negative for abdominal pain, diarrhea, nausea and vomiting.  Genitourinary:  Negative for flank pain.  Musculoskeletal:  Negative for back pain.  Skin:  Negative for pallor and wound.  All other systems reviewed and are negative.  Physical Exam Updated Vital Signs BP (!) 138/94   Pulse 73   Temp 98.3 F (36.8 C) (Oral)   Resp 18   SpO2 99%   Physical Exam Vitals and nursing note reviewed.  Constitutional:      Appearance: Normal appearance.  HENT:     Head:  Normocephalic and atraumatic.     Mouth/Throat:     Mouth: Mucous membranes are moist.  Eyes:     Pupils: Pupils are equal, round, and reactive to light.  Cardiovascular:     Rate and Rhythm: Normal rate.     Pulses:          Popliteal pulses are 2+ on the right side and 2+ on the left side.       Dorsalis pedis pulses are 2+ on the right side and 2+ on the left side.  Pulmonary:     Effort: Pulmonary effort is normal.  Abdominal:     General: Abdomen is flat.     Tenderness: There is no abdominal tenderness. There is no right CVA tenderness or left CVA tenderness.  Musculoskeletal:     Cervical back: Normal range of motion and neck supple.  Skin:    General: Skin is warm and dry.  Neurological:     Mental Status: He is alert and oriented to person, place, and time.    ED Results / Procedures / Treatments   Labs (all labs ordered are listed, but only abnormal results are displayed) Labs Reviewed  CBC WITH DIFFERENTIAL/PLATELET - Abnormal; Notable for the following components:      Result Value   Hemoglobin 11.8 (*)    HCT 35.9 (*)    All other components within normal limits  COMPREHENSIVE METABOLIC PANEL - Abnormal; Notable for the following components:   Calcium 8.7 (*)    Total Bilirubin 0.2 (*)    All other components within normal limits  CK    EKG None  Radiology DG Ankle Complete Left  Result Date: 10/29/2020 CLINICAL DATA:  Left foot and ankle pain and swelling for several weeks, no known injury EXAM: LEFT ANKLE COMPLETE - 3+ VIEW; LEFT FOOT - COMPLETE 3+ VIEW COMPARISON:  09/16/2020 FINDINGS: No fracture or dislocation of the left foot or ankle. There is severe, diffuse soft tissue edema about the foot and ankle, which is increased compared to prior examination. IMPRESSION: No fracture or dislocation of the left foot or ankle. There is severe, diffuse soft tissue edema about the foot and ankle, increased compared to prior examination. Electronically Signed   By:  Eddie Candle M.D.   On: 10/29/2020 10:38   DG Foot Complete Left  Result Date: 10/29/2020 CLINICAL DATA:  Left foot and ankle pain and swelling for several weeks, no known injury EXAM: LEFT ANKLE COMPLETE - 3+ VIEW; LEFT FOOT - COMPLETE 3+ VIEW COMPARISON:  09/16/2020 FINDINGS: No fracture or dislocation of the left  foot or ankle. There is severe, diffuse soft tissue edema about the foot and ankle, which is increased compared to prior examination. IMPRESSION: No fracture or dislocation of the left foot or ankle. There is severe, diffuse soft tissue edema about the foot and ankle, increased compared to prior examination. Electronically Signed   By: Eddie Candle M.D.   On: 10/29/2020 10:38   VAS [place] LOWER EXTREMITY VENOUS (DVT) (ONLY [facility] & [facility] 7a-7p)  Result Date: 10/29/2020  Lower Venous DVT Study Patient Name:  [name]  Date of Exam:   10/29/2020 Medical Rec #: [id number]             Accession #:    [id number] Date of Birth: Jun 30, 1970            Patient Gender: M Patient Age:   43 years Exam Location:  [facility] Procedure:      VAS [place] LOWER EXTREMITY VENOUS (DVT) Referring Phys: Beverley Fiedler Chantilly Linskey --------------------------------------------------------------------------------  Indications: Edema.  Comparison Study: no prior Performing Technologist: Archie Patten RVS  Examination Guidelines: A complete evaluation includes B-mode imaging, spectral Doppler, color Doppler, and power Doppler as needed of all accessible portions of each vessel. Bilateral testing is considered an integral part of a complete examination. Limited examinations for reoccurring indications may be performed as noted. The reflux portion of the exam is performed with the patient in reverse Trendelenburg.  +-----+---------------+---------+-----------+----------+--------------+ RIGHTCompressibilityPhasicitySpontaneityPropertiesThrombus Aging +-----+---------------+---------+-----------+----------+--------------+ CFV   Full           Yes      Yes                                 +-----+---------------+---------+-----------+----------+--------------+   +---------+---------------+---------+-----------+----------+-------------------+ LEFT     CompressibilityPhasicitySpontaneityPropertiesThrombus Aging      +---------+---------------+---------+-----------+----------+-------------------+ CFV      Full           Yes      Yes                                      +---------+---------------+---------+-----------+----------+-------------------+ SFJ      Full                                                             +---------+---------------+---------+-----------+----------+-------------------+ FV Prox  Full                                                             +---------+---------------+---------+-----------+----------+-------------------+ FV Mid   Full                                                             +---------+---------------+---------+-----------+----------+-------------------+ FV DistalFull                                                             +---------+---------------+---------+-----------+----------+-------------------+  PFV      Full                                                             +---------+---------------+---------+-----------+----------+-------------------+ POP      Full           Yes      Yes                                      +---------+---------------+---------+-----------+----------+-------------------+ PTV      Full                                                             +---------+---------------+---------+-----------+----------+-------------------+ PERO                                                  Not well visualized +---------+---------------+---------+-----------+----------+-------------------+     Summary: RIGHT: - No evidence of common femoral vein obstruction.  LEFT: - There is no evidence of deep  vein thrombosis in the lower extremity.  - No cystic structure found in the popliteal fossa.  *See table(s) above for measurements and observations.    Preliminary     Procedures Procedures   Medications Ordered in ED Medications  ketorolac (TORADOL) 15 MG/ML injection 15 mg (15 mg Intramuscular Given 10/29/20 1023)    ED Course  I have reviewed the triage vital signs and the nursing notes.  Pertinent labs & imaging results that were available during my care of the patient were reviewed by me and considered in my medical decision making (see chart for details).  Clinical Course as of 10/29/20 1422  Sun Oct 29, 2020  1302 CK Total: 142 [JS]    Clinical Course User Index [JS] Janeece Fitting, PA-C   MDM Rules/Calculators/A&P   Patient with a past medical history of substance abuse, heroin use, MRSA presents to the ED with a chief complaint of left leg swelling that is been ongoing for the past couple days.  Patient does have a visit from yesterday where he had labs drawn which were all within normal limits.  Unfortunately they were unable to obtain a DVT study which this was ordered today.  On today's visit I personally ordered DVT study after evaluating his leg which now looks like there has been worsening swelling.  Derm evaluation patient is overall toxic appearing, does come in and out of consciousness, according to his med list reviewed he does take Xanax, does report this makes him feel very drowsy but takes this for his anxiety.  Pulses are present, sensation is intact throughout with significant pain to the left lower leg.  No calf tenderness.   US DVT showed: RIGHT:  - No evidence of common femoral vein obstruction.     LEFT:  - There is no evidence of deep vein thrombosis in the lower extremity.     -  No cystic structure found in the popliteal fossa.     *See table(s) above for measurements and observations.      Xray of the left foot:  No fracture or dislocation of the  left foot or ankle. There is  severe, diffuse soft tissue edema about the foot and ankle,  increased compared to prior examination.   These results were discussed with patient at length.  We also discussed obtaining further lab work to evaluate due to his previous history of IV drug use.  CK is 142 within normal limits.  CBC with no leukocytosis, hemoglobin slightly decreased.  CMP without any electrolyte derangement.  No signs of infection on today's work-up, no signs of blood clot.  He is vascularly intact.  We discussed further follow-up with PCP on an outpatient basis.  Patient understands and agrees to management, return precautions discussed at length.  Patient stable for discharge  Patient became agitated after having his discharge papers handed to him, stating "what else is wrong with my foot ", will discuss further evaluation with primary care, he will need to follow-up with PCP as needed along with specialist.  There is no acute finding at this time however does not mean that he has not continued to develop any chronic disease.  He is without any signs of infection today no tachycardia no fever no white count.  Patient stable for discharge.    Portions of this note were generated with Lobbyist. Dictation errors may occur despite best attempts at proofreading.  Final Clinical Impression(s) / ED Diagnoses Final diagnoses:  Acute left ankle pain  Left leg swelling    Rx / DC Orders ED Discharge Orders     None        Janeece Fitting, PA-C 10/29/20 1358    Janeece Fitting, PA-C 10/29/20 1423    Lacretia Leigh, MD 11/06/20 1542

## 2020-10-29 NOTE — ED Notes (Signed)
Discussed discharge instructions with patient, answered all questions.  Patient refused to sign discharge form, stating he has not spoke with anyone like a doctor, which I let him know that I just saw Johana, [place] come out of his room, he stated he had not spoken with her.  Beverley Fiedler, [place] made aware and once again spoke with patient.

## 2020-10-29 NOTE — Progress Notes (Signed)
Lower extremity venous has been completed.   Preliminary results in CV Proc.   Jinny Blossom Anyra Kaufman 10/29/2020 9:38 AM

## 2020-10-29 NOTE — Discharge Instructions (Signed)
Your laboratory results on today's visit were within normal limit.  I discussed the results of your ultrasound along with your x-rays.  You will need to follow-up with your primary care physician as needed.

## 2021-05-28 ENCOUNTER — Telehealth: Payer: Self-pay

## 2021-05-28 NOTE — Telephone Encounter (Signed)
[facility] - Client ?Nonclinical Telephone Record  ?AccessNurse? ?Client [facility] - Client ?Client Site [place] ?Provider Viviana Simpler- MD ?Contact Type Call ?Who Is Calling Patient / Member / Family / Caregiver ?Caller Name Rosilyn Mings ?Caller Phone Number [phone] ?Patient Name [name] ?Patient DOB 1970/10/01 ?Call Type Message Only Information Provided ?Reason for Call Request to Schedule Office Appointment ?Initial Comment Caller states she would like to make a new patient appointment. She states her son is out of his ?mind. Her son came home without pants. He does not know how he got home. ?Patient request to speak to RN No ?Additional Comment Office hours provided ?Disp. Time Disposition Final User ?05/27/2021 9:44:14 AM General Information Provided Yes Achilles Dunk ?Call Closed By: Achilles Dunk ?Transaction Date/Time: 05/27/2021 9:40:11 AM (ET ?

## 2021-05-28 NOTE — Telephone Encounter (Signed)
I spoke with Lucita Ferrara (no DPR signed to speak with anyone else) pt does not have PCP; pt came home over weekend and did not know how he got home and was not wearing pants. No SI/HI; Lucita Ferrara is going to get pt to ED for eval and Lucita Ferrara will cb later to get new pt appt. Sending note to Wendall Mola as Juluis Rainier. ?

## 2021-06-14 ENCOUNTER — Ambulatory Visit (HOSPITAL_COMMUNITY)
Admission: EM | Admit: 2021-06-14 | Discharge: 2021-06-14 | Disposition: A | Discharge status: Home / Self Care | Payer: Commercial Managed Care - HMO

## 2021-06-14 DIAGNOSIS — F1110 Opioid abuse, uncomplicated: Secondary | ICD-10-CM

## 2021-06-14 DIAGNOSIS — F1320 Sedative, hypnotic or anxiolytic dependence, uncomplicated: Secondary | ICD-10-CM | POA: Insufficient documentation

## 2021-06-14 DIAGNOSIS — F1120 Opioid dependence, uncomplicated: Secondary | ICD-10-CM | POA: Insufficient documentation

## 2021-06-14 NOTE — ED Triage Notes (Signed)
Pt presents to [facility] seeking substance use treatment for his heroine addiction. Pt states that he last used 36 hours ago about 1/2 gram. Pt states that he was having withdrawal symptoms nausea and chills at times. Pt denies SI/HI and AVH. ?

## 2021-06-14 NOTE — Discharge Summary (Signed)
[name] to be D/C'd Home per NP order. An After Visit Summary was printed and given to the patient by provider. Patient escorted out and D/C home via private auto.  ?Hazeline Charnley  Kathlen Brunswick  ?06/14/2021 4:52 PM ?  ?   ?

## 2021-06-14 NOTE — Discharge Instructions (Addendum)
Substance Abuse Treatment Programs ° °Intensive Outpatient Programs °[facility]     °[address]      °[place], [place]                   °[phone]      ° °[facility] °[address] °[place], [place] °[phone] ° °[facility]     °(Inpatient and outpatient)     °[address]           °[phone]   ° °[facility] °[phone] (Suboxone and Methadone) ° °[address]      °[place], [place] [address]      °[phone]      ° °[address] °[place], [place] °852-3033 ° °[facility] (Outpatient/Inpatient, Chemical)    °(insurance only) [phone]      °       °[facility] (Groups & Residential) °[place], [place] °[phone] ° °   °[facility]     °[address]     °[place], [place]      °[phone]      ° °[facility] (for caregivers and family) °[address] °[place], [place] °336-299-4655 ° ° ° ° ° °Residential Treatment Programs °[facility]      °[address], [place], [place] [address]  °[phone]      ° °[facility] °[address] [place], [place] [address] °[phone] ° °[facility]        °[phone]      ° °[facility] °1-800-659-3381 ° °[facility]             °[address]                                         °[place], [place]                                                °[phone] or 336-784-9470                              ° °[facility] [place] °[address] °[place], [address] °[phone] ° °[facility]    °[address]      °[place], [place]     °[phone]      ° °[facility] °[address] °[place], [place] °[phone] ° °[facility]   °[address]     °[place], [place] [address]     °[phone]      °Admissions: 8am-3pm M-F ° °[facility] °[address] °[place],  [place] °336-227-7417 ° °[facility]: Residential Program (90 Days)   °[place], [place]      °336-725-8389 or 800-758-6077    ° °[facility] °[place], [place] °(Walk in Hours over the weekend or by referral) ° °[facility] °[address], [place], [place] [address] °([phone] ° °Crisis Mobile: [facility]:  1-877-626-1772 (for crisis response 24 hours a day) °[facility] Hotline:      1-800-256-2452 °Outpatient Psychiatry and Counseling ° °[facility]: Mobile Crisis   Management 24 hours:  1-877-626-1772 ° °[facility] sliding scale fee and walk in schedule: M-F 8am-12pm/1pm-3pm °[address]  °[place], [place] [address] °[phone] ° °[facility] °[address] °[place], [place] [address] °[phone] ° °[facility] ([facility])- new patient walk-in appointments available Monday - Friday 8am -3pm.          °[address] °[place], [place] [address] °336-676-6840 or crisis line- 336-676-6905 ° °[facility] Intensive Outpatient Therapy Program °[address] °[place], [place] [address] °336-832-9804 ° °[facility]                  °Crisis Services      °[phone]      °[address]     °[place], [place] [address]                ° °[facility]   °[facility] °[phone] °[address] °[place], [place] [address] ° ° °[facility]          °[address]  °[place], [place] [address]       °([phone] ° °[facility] °[address] °[place], [place] [address] °336-292-1510 ° °[facility]    °[address]    °[place], [place] [address]     °[phone]      ° °Parish McKinney, MD     °[address]     °[place] [address]     °336-282-1251     °  °[facility] °[address] °[place] [address] ° °[facility]     °[address]     °[place], [place]      °336-542-2076      ° °[facility] °Shamsher Ahluwalia, MD °[address] °[place], [place] [address] °[phone] ° °[facility]     °[address]     °[place], [place] [address]     °336-274-1237      ° °[facility] °[address] °[place], [place] [address] °336-854-4450 °Resources for Temporary Residential Assistance/Crisis Centers ° °DAY CENTERS °[facility] °M-F 8am-3pm   °[address] [place], [place] [address]   336-332-0824 °Services include: laundry, barbering, support groups, case management, phone  & computer access, showers, AA/NA mtgs, mental health/substance abuse nurse, job skills class, disability information, VA assistance, spiritual classes, etc.  ° °HOMELESS SHELTERS ° °[facility]     °[facility]   °[address], [place]     °[phone]       °       °[facility] (women and children)       °[address] °[place], [place] [address] °336-275-0820 °[email or web address] for application and process °Application Required ° °[facility]   °[address]    °[place] [address]     °[phone]       °             °[facility] °[address] °[place],  [address] °336.273.5572 °336-235-0363(schedule application appt.) °Application Required ° °[facility] (women only)    °[address]     °[place],  [address]     °336-884-1039      °  Intake starts 6pm daily °Need valid ID, SSC, & Police report °[facility] [place] °[address] °[place], [place] °336-881-5420 °Application Required ° °[facility] (men only)     °[address]      °[place], [place]     °[phone]      ° °[facility] °(Pregnant women only) °[address] °[place], [place] °336-275-0206 ° °[facility]      °[address]      °[place], [place] [address]     °[phone]      °       °[facility] °[address] °[place], [place] °336-723-1848 °90 day commitment/SA/Application process ° °[facility](men only)     °[address]     °[place], [place]     °[phone]       °Check-in at 7pm     °       °[facility] °[address] °[place], [place] [address] °336-248-6684 °Men/Women/Women and Children must be there by 7 pm ° °[facility] °[place],  °336-722-8721                ° °

## 2021-06-14 NOTE — ED Provider Notes (Signed)
Behavioral Health Urgent Care Medical Screening Exam ? ?Patient Name: [name] ?MRN: [id number] ?Date of Evaluation: 06/14/21 ?Chief Complaint:   ?Diagnosis:  ?Final diagnoses:  ?Heroin abuse ([place])  ? ? ?History of Present illness: [name] is a 51 y.o. male patient presented to [facility] as a walk in  alone requesting detox from Heroin.  ? ?[name], 51 y.o., male patient seen face to face by this provider, consulted with Dr. Serafina Mitchell; and chart reviewed on 06/14/21.  Patient reports he has a past psychiatric history of depression and anxiety.  He is prescribed alprazolam 2 mg 3 times daily, Adderall, and Celexa by his outpatient provider with Dr. Chucky May.  He has no outpatient therapy in place.  Reports he is currently living at his mother's home.  He is recently unemployed. ? ?On evaluation [name] presents to the [facility] requesting detox from heroin.  He uses a 1-2  grams per week.  His last use was 36 hours ago.  He has been using for 3-4 years with no periods of sobriety.  He denies ever participating in a residential treatment program.  He has in the past been on Suboxone for his opioid addiction.  He is currently reporting withdrawal symptoms of chills, runny nose, watery eyes, and nausea.  He has a court date on 06/18/2021 for writing bad checks.  He is requesting residential treatment for substance abuse.  ? ?During the evaluation [name] is in sitting position in no acute distress.  He is alert/oriented x4 and cooperative.  He is disheveled and dirty. He makes good eye contact.  He has normal speech and behavior.  He endorses some depression related to his addiction and states when he is going through withdrawal it becomes worse.  He endorses anxiety.  He denies any concerns with sleep or appetite.  He denies SI/HI/AVH.  He contracts for safety.  There is no indication that he is responding to internal/external stimuli.  Patient answered  questions appropriately. ? ?Discussed treatment options with patient including admission to the [facility].  Explained to patient that if he wanted to be admitted to a residential treatment program that he would not be allowed to take alprazolam and Adderall.  Discussed the treatment plan of putting patient on an Ativan taper to detox from alprazolam.  Explained that comfort measures would be provided.  Also explained that Suboxone is not initiated at this facility.  Patient declined admission and states, "I think I need Suboxone to be able to do this".  He requested to be discharged with resources. ? ?At this time [name] is educated and verbalizes understanding of mental health resources and other crisis services in the community.  He is instructed to call 911 and present to the nearest emergency room should he experience any suicidal/homicidal ideation, auditory/visual/hallucinations, or detrimental worsening of his mental health condition.  He was a also advised by Probation officer that he could call the toll-free phone on insurance card to assist with identifying in network counselors and agencies or number on back of the insurance card. ?  ? ?Psychiatric Specialty Exam ? ?Presentation  ?General Appearance:Disheveled ? ?Eye Contact:Good ? ?Speech:Clear and Coherent; Normal Rate ? ?Speech Volume:Normal ? ?Handedness:Right ? ? ?Mood and Affect  ?Mood:Anxious ? ?Affect:Congruent ? ? ?Thought Process  ?Thought Processes:Coherent ? ?Descriptions of Associations:Intact ? ?Orientation:Full (Time, Place and Person) ? ?Thought Content:Logical ?   Hallucinations:None ? ?Ideas  of Reference:None ? ?Suicidal Thoughts:No ? ?Homicidal Thoughts:No ? ? ?Sensorium  ?Memory:Immediate Good; Recent Good; Remote Good ? ?Judgment:Fair ? ?Insight:Good ? ? ?Executive Functions  ?Concentration:Good ? ?Attention Span:Good ? ?Recall:Good ? ?[place] of [place] ? ?Language:Good ? ? ?Psychomotor Activity  ?Psychomotor  Activity:Normal ? ? ?Assets  ?Assets:Communication Skills; Desire for Improvement; Physical Health; Resilience; Social Support ? ? ?Sleep  ?Sleep:Good ? ?Number of hours: No data recorded ? ?No data recorded ? ?Physical Exam: ?Physical Exam ?Vitals and nursing note reviewed.  ?Constitutional:   ?   General: He is not in acute distress. ?   Appearance: Normal appearance. He is well-developed.  ?HENT:  ?   Head: Normocephalic and atraumatic.  ?Eyes:  ?   General:     ?   Right eye: No discharge.     ?   Left eye: No discharge.  ?   Conjunctiva/sclera: Conjunctivae normal.  ?Cardiovascular:  ?   Rate and Rhythm: Normal rate and regular rhythm.  ?   Heart sounds: No murmur heard. ?Pulmonary:  ?   Effort: Pulmonary effort is normal. No respiratory distress.  ?   Breath sounds: Normal breath sounds.  ?Abdominal:  ?   Palpations: Abdomen is soft.  ?   Tenderness: There is no abdominal tenderness.  ?Musculoskeletal:     ?   General: No swelling. Normal range of motion.  ?   Cervical back: Normal range of motion and neck supple.  ?Skin: ?   General: Skin is warm and dry.  ?   Capillary Refill: Capillary refill takes less than 2 seconds.  ?   Coloration: Skin is not jaundiced or pale.  ?Neurological:  ?   Mental Status: He is alert and oriented to person, place, and time.  ?Psychiatric:     ?   Attention and Perception: Attention and perception normal.     ?   Mood and Affect: Mood is anxious.     ?   Speech: Speech normal.     ?   Behavior: Behavior is cooperative.     ?   Thought Content: Thought content normal.     ?   Cognition and Memory: Cognition normal.     ?   Judgment: Judgment is impulsive.  ? ?Review of Systems  ?Constitutional: Negative.   ?HENT: Negative.    ?Eyes: Negative.   ?Respiratory: Negative.    ?Cardiovascular: Negative.   ?Musculoskeletal: Negative.   ?Skin: Negative.   ?Neurological: Negative.   ?Psychiatric/Behavioral:  Positive for substance abuse. The patient is nervous/anxious.   ?Blood pressure  (!) 162/107, pulse 84, temperature 98.7 ?F (37.1 ?C), temperature source Oral, resp. rate 16, SpO2 99 %. There is no height or weight on file to calculate BMI. ? ?Musculoskeletal: ?Strength & Muscle Tone: within normal limits ?Gait & Station: normal ?Patient leans: N/A ? ? ?[facility] MSE Discharge Disposition for Follow up and Recommendations: ?Based on my evaluation the patient does not appear to have an emergency medical condition and can be discharged with resources and follow up care in outpatient services for Medication Management, Substance Abuse Intensive Outpatient Program, and Individual Therapy ? ?Discharge patient  ? ?Provided outpatient, inpatient, and residential treatment resources for substance abuse treatment. ? ? ?Revonda Humphrey, NP ?06/14/2021, 4:38 PM ? ?

## 2021-06-14 NOTE — BH Assessment (Incomplete)
Comprehensive Clinical Assessment (CCA) Note ? ?06/14/2021 ?[name] ?[id number] ? ?Disposition:  Manuel Lolling, NP, offered patient admission into the [facility], but while she was making arrangements for him to be admitted, he opted to leave. ? ?The patient demonstrates the following risk factors for suicide: Chronic risk factors for suicide include: psychiatric disorder of depression and anxiety and substance use disorder. Acute risk factors for suicide include: unemployment, social withdrawal/isolation, and loss (financial, interpersonal, professional). Protective factors for this patient include: positive social support and hope for the future. Considering these factors, the overall suicide risk at this point appears to be low. Patient is appropriate for outpatient follow up.  ? ?GAD-7   ? ?[place] Office Visit from 05/08/2017 in [place]  ?Total GAD-7 Score 11  ? ?  ? ?PHQ2-9   ? ?[place] ED from 06/14/2021 in [facility] Office Visit from 05/08/2017 in [place] Visit from 07/02/2016 in [name] at [place] from 05/24/2015 in [name] at [facility] from 02/01/2015 in [name] at [facility]  ?PHQ-2 Total Score 4 3 0 0 0  ?PHQ-9 Total Score 17 14 -- -- --  ? ?  ? ?[place] ED from 06/14/2021 in [facility] ED from 10/29/2020 in [place] DEPT ED from 10/28/2020 in [place] Emergency Dept  ?C-SSRS RISK CATEGORY No Risk No Risk No Risk  ? ?  ?  ? ?Chief Complaint:  ?Chief Complaint  ?Patient presents with  ? Addiction Problem  ? ?Visit Diagnosis: F11.20 Opioid Use Disorder Severe, F13.30 Stimulant Use Disorder Severe  ? ? ?CCA Screening, Triage and Referral (STR) ? ?Patient Reported Information ?How did you hear about [place]? Self ? ?What Is the Reason for Your Visit/Call Today? Pt presents to [facility] seeking substance  use treatment for his heroine addiction. Pt states that he last used 36 hours ago about 1/2 gram. Pt states that he was having withdrawal symptoms nausea and chills at times. Pt denies SI/HI and AVH. ? ?Patient states that he  has been abusing Opioids for may years and states that he has most recently been using 2 grams weekly.  Patient states that he is also prescribed 2 mg of Ativan three times daily for his aniexty as well as Aderall for his ADD. Patient states that he would like to get clean and sober and get his life on track.  Patient states that he has lost everything due to his addiction.  He states that he has sold everything he owns and is essentially homeless. Patient states that he is experiencing withdrawal symptoms of chills, sweats, nausea, anxiety. ? ?Patient denies any history of inpatient psychiatric treatment in the past, but sees Dr. Toy [name] on an outpatient basis. For his depression and his anxiety.  Patient states that he has sleep and appetite disturbance.  Mo history of abuse or self-mutilation. ? ?Patient is alert and oriented, his mood depressed.  He has characteristically poor judgment, insight and impulse control.  His memory is intact and his thoughts organized.  Patient does not appear to be responding to any internal stimuli.  His speech is normal in tone and rate and his eye contact is good. ? ?How Long Has This Been Causing You Problems? > than 6 months ? ?What Do You Feel Would Help You the Most Today? Alcohol or Drug Use Treatment ? ? ?Have You Recently Had Any Thoughts  About Hurting Yourself? No ? ?Are You Planning to Commit Suicide/Harm Yourself At This time? No ? ? ?Have you Recently Had Thoughts About [place]? No ? ?Are You Planning to Harm Someone at This Time? No ? ?Explanation: No data recorded ? ?Have You Used Any Alcohol or Drugs in the Past 24 Hours? No ? ?How Long Ago Did You Use Drugs or Alcohol? No data recorded ?What Did You Use and How Much? No data  recorded ? ?Do You Currently Have a Therapist/Psychiatrist? No data recorded ?Name of Therapist/Psychiatrist: No data recorded ? ?Have You Been Recently Discharged From Any Office Practice or Programs? No data recorded ?Explanation of Discharge From Practice/Program: No data recorded ? ?  ?CCA Screening Triage Referral Assessment ?Type of Contact: No data recorded ?Telemedicine Service Delivery:   ?Is this Initial or Reassessment? No data recorded ?Date Telepsych consult ordered in [facility]:  No data recorded ?Time Telepsych consult ordered in [facility]:  No data recorded ?Location of Assessment: No data recorded ?Provider Location: No data recorded ? ?Collateral Involvement: No data recorded ? ?Does Patient Have a Stage manager Guardian? No data recorded ?Name and Contact of Legal Guardian: No data recorded ?If Minor and Not Living with Parent(s), Who has Custody? No data recorded ?Is CPS involved or ever been involved? No data recorded ?Is APS involved or ever been involved? No data recorded ? ?Patient Determined To Be At Risk for Harm To Self or Others Based on Review of Patient Reported Information or Presenting Complaint? No data recorded ?Method: No data recorded ?Availability of Means: No data recorded ?Intent: No data recorded ?Notification Required: No data recorded ?Additional Information for Danger to Others Potential: No data recorded ?Additional Comments for Danger to Others Potential: No data recorded ?Are There Guns or Other Weapons in [place]? No data recorded ?Types of Guns/Weapons: No data recorded ?Are These Weapons Safely Secured?                            No data recorded ?Who Could Verify You Are Able To Have These Secured: No data recorded ?Do You Have any Outstanding Charges, Pending Court Dates, Parole/Probation? No data recorded ?Contacted To Inform of Risk of Harm To Self or Others: No data recorded ? ? ?Does Patient Present under Involuntary Commitment? No data recorded ?IVC Papers  Initial File Date: No data recorded ? ?[place] of Residence: No data recorded ? ?Patient Currently Receiving the Following Services: No data recorded ? ?Determination of Need: Routine (7 days) ? ? ?Options For Referral: Outpatient Therapy; Medication Management ? ? ? ? ?CCA Biopsychosocial ?Patient Reported Schizophrenia/Schizoaffective Diagnosis in Past: No ? ? ?Strengths: desire to get help for himself ? ? ?Mental Health Symptoms ?Depression:   ?Change in energy/activity; Fatigue; Increase/decrease in appetite; Sleep (too much or little) ?  ?Duration of Depressive symptoms:  ?Duration of Depressive Symptoms: Greater than two weeks ?  ?Mania:   ?None ?  ?Anxiety:    ?None ?  ?Psychosis:   ?None ?  ?Duration of Psychotic symptoms:    ?Trauma:   ?None ?  ?Obsessions:   ?None ?  ?Compulsions:   ?None ?  ?Inattention:   ?None ?  ?Hyperactivity/Impulsivity:   ?None ?  ?Oppositional/Defiant Behaviors:   ?None ?  ?Emotional Irregularity:   ?Potentially harmful impulsivity ?  ?Other Mood/Personality Symptoms:   ?depressed mood, flat affect, in opioid withdrawal ?  ? ?Mental Status Exam ?Appearance  and self-[name]  ?Stature:   ?Average ?  ?Weight:   ?Average weight ?  ?Clothing:   ?Disheveled; Casual ?  ?Grooming:   ?Neglected ?  ?Cosmetic use:   ?None ?  ?Posture/gait:   ?Normal ?  ?Motor activity:   ?Not Remarkable ?  ?Sensorium  ?Attention:   ?Normal ?  ?Concentration:   ?Normal ?  ?Orientation:   ?Object; Person; Place; Situation; Time ?  ?Recall/memory:   ?Normal ?  ?Affect and Mood  ?Affect:   ?Depressed; Flat ?  ?Mood:   ?Depressed ?  ?Relating  ?Eye contact:   ?Normal ?  ?Facial expression:   ?Depressed ?  ?Attitude toward examiner:   ?Cooperative ?  ?Thought and Language  ?Speech flow:  ?Clear and Coherent ?  ?Thought content:   ?Appropriate to Mood and Circumstances ?  ?Preoccupation:   ?None ?  ?Hallucinations:   ?None ?  ?Organization:  No data recorded  ?Executive Functions  ?Fund of Knowledge:   ?Average ?   ?Intelligence:   ?Average ?  ?Abstraction:   ?Normal ?  ?Judgement:   ?Impaired ?  ?Reality Testing:   ?Realistic ?  ?Insight:   ?Lacking ?  ?Decision Making:   ?Impulsive ?  ?Social Functioning  ?Social Maturity:   ?

## 2021-07-04 ENCOUNTER — Telehealth (HOSPITAL_COMMUNITY): Payer: Self-pay | Admitting: Internal Medicine

## 2021-07-04 NOTE — BH Assessment (Signed)
Care Management - [place] Follow Up Discharges   Writer attempted to make contact with patient today and was unsuccessful.  Phone just rang.  Per chart revierw, patient will follow up with his established provider Dr. Chucky May.  Patient was provided resources for outpatient therapy.

## 2021-08-10 ENCOUNTER — Emergency Department (HOSPITAL_COMMUNITY): Payer: Self-pay

## 2021-08-10 ENCOUNTER — Other Ambulatory Visit: Payer: Self-pay

## 2021-08-10 ENCOUNTER — Emergency Department (HOSPITAL_COMMUNITY)
Admission: EM | Admit: 2021-08-10 | Discharge: 2021-08-10 | Disposition: A | Discharge status: Home / Self Care | Payer: Self-pay | Attending: Emergency Medicine | Admitting: Emergency Medicine

## 2021-08-10 ENCOUNTER — Encounter (HOSPITAL_COMMUNITY): Payer: Self-pay

## 2021-08-10 DIAGNOSIS — T50901A Poisoning by unspecified drugs, medicaments and biological substances, accidental (unintentional), initial encounter: Secondary | ICD-10-CM | POA: Insufficient documentation

## 2021-08-10 DIAGNOSIS — I10 Essential (primary) hypertension: Secondary | ICD-10-CM | POA: Insufficient documentation

## 2021-08-10 DIAGNOSIS — R4182 Altered mental status, unspecified: Secondary | ICD-10-CM | POA: Insufficient documentation

## 2021-08-10 DIAGNOSIS — Z20822 Contact with and (suspected) exposure to covid-19: Secondary | ICD-10-CM | POA: Insufficient documentation

## 2021-08-10 LAB — COMPREHENSIVE METABOLIC PANEL
ALT: 22 U/L (ref 0–44)
AST: 15 U/L (ref 15–41)
Albumin: 3.4 g/dL — ABNORMAL LOW (ref 3.5–5.0)
Alkaline Phosphatase: 65 U/L (ref 38–126)
Anion gap: 9 (ref 5–15)
BUN: 15 mg/dL (ref 6–20)
CO2: 24 mmol/L (ref 22–32)
Calcium: 8.9 mg/dL (ref 8.9–10.3)
Chloride: 108 mmol/L (ref 98–111)
Creatinine, Ser: 1.51 mg/dL — ABNORMAL HIGH (ref 0.61–1.24)
GFR, Estimated: 56 mL/min — ABNORMAL LOW (ref >60)
Glucose, Bld: 99 mg/dL (ref 70–99)
Potassium: 4.7 mmol/L (ref 3.5–5.1)
Sodium: 141 mmol/L (ref 135–145)
Total Bilirubin: 0.7 mg/dL (ref 0.3–1.2)
Total Protein: 7.2 g/dL (ref 6.5–8.1)

## 2021-08-10 LAB — CBC WITH DIFFERENTIAL/PLATELET
Abs Immature Granulocytes: 0.02 10*3/uL (ref 0.00–0.07)
Basophils Absolute: 0 10*3/uL (ref 0.0–0.1)
Basophils Relative: 0 %
Eosinophils Absolute: 0.1 10*3/uL (ref 0.0–0.5)
Eosinophils Relative: 1 %
HCT: 46.4 % (ref 39.0–52.0)
Hemoglobin: 15.4 g/dL (ref 13.0–17.0)
Immature Granulocytes: 0 %
Lymphocytes Relative: 13 %
Lymphs Abs: 1 10*3/uL (ref 0.7–4.0)
MCH: 30.3 pg (ref 26.0–34.0)
MCHC: 33.2 g/dL (ref 30.0–36.0)
MCV: 91.2 fL (ref 80.0–100.0)
Monocytes Absolute: 0.4 10*3/uL (ref 0.1–1.0)
Monocytes Relative: 5 %
Neutro Abs: 6.8 10*3/uL (ref 1.7–7.7)
Neutrophils Relative %: 81 %
Platelets: 283 10*3/uL (ref 150–400)
RBC: 5.09 MIL/uL (ref 4.22–5.81)
RDW: 13.7 % (ref 11.5–15.5)
WBC: 8.3 10*3/uL (ref 4.0–10.5)
nRBC: 0 % (ref 0.0–0.2)

## 2021-08-10 LAB — RESP PANEL BY RT-PCR (FLU A&B, COVID) ARPGX2
Influenza A by PCR: NEGATIVE
Influenza B by PCR: NEGATIVE
SARS Coronavirus 2 by RT PCR: NEGATIVE

## 2021-08-10 LAB — ETHANOL: Alcohol, Ethyl (B): <10 mg/dL (ref <10)

## 2021-08-10 LAB — ACETAMINOPHEN LEVEL: Acetaminophen (Tylenol), Serum: 13 ug/mL (ref 10–30)

## 2021-08-10 LAB — MAGNESIUM: Magnesium: 1.9 mg/dL (ref 1.7–2.4)

## 2021-08-10 LAB — SALICYLATE LEVEL: Salicylate Lvl: <7 mg/dL — ABNORMAL LOW (ref 7.0–30.0)

## 2021-08-10 LAB — CK: Total CK: 59 U/L (ref 49–397)

## 2021-08-10 MED ORDER — SODIUM CHLORIDE 0.9 % IV BOLUS
2000.0000 mL | Freq: Once | INTRAVENOUS | Status: AC
  Administered 2021-08-10: 2000 mL via INTRAVENOUS

## 2021-08-10 MED ORDER — LORAZEPAM 2 MG/ML IJ SOLN
2.0000 mg | Freq: Once | INTRAMUSCULAR | Status: AC
  Administered 2021-08-10: 2 mg via INTRAVENOUS
  Filled 2021-08-10: qty 1

## 2021-08-10 MED ORDER — NALOXONE HCL 4 MG/0.1ML NA LIQD: NASAL | 0 refills | Status: AC

## 2021-08-10 MED ORDER — NALOXONE HCL 0.4 MG/ML IJ SOLN: 0.4000 mg | INTRAMUSCULAR | Status: DC | PRN

## 2021-08-10 NOTE — ED Provider Notes (Signed)
Emergency Department Provider Note   I have reviewed the triage vital signs and the nursing notes.   HISTORY  Chief Complaint Drug Overdose   HPI [name] is a 51 y.o. male with past history of hypertension, depression/anxiety, polysubstance abuse presents to the emergency department after being found on the side of the road with decreased level of alertness.  EMS was called at which point they noticed a bottle of Adderall beside him with only 8 tablets left.  The prescription appeared to been filled today and was supposed to have 90 tablet.  No pills found along the ground.  Patient was hypotensive and less responsive.  He was not given Narcan.  They administered 1 L IV fluid and transported to the emergency department.  Patient is arousable but once aroused is belligerent and evasive.  He tells me "I didn't take anything" but when asked about the Adderall says "maybe" but then "no, no, no I didn't take that." Denies EtOH. Reports using heroine yesterday. Denies IVDA. Patient says he was out for a walk and is now here and that "you all are keeping me hostage."    Past Medical History:  Diagnosis Date   Anxiety    Chronic back pain    Depression    History of diverticulosis    History of kidney stones    Hypertension    Kidney stones    MRSA (methicillin resistant staph aureus) culture positive    Substance abuse ([place])    former abuser methodone and oxycodone    Review of Systems  Constitutional: No fever/chills Eyes: No visual changes. ENT: No sore throat. Cardiovascular: Denies chest pain. Respiratory: Denies shortness of breath. Gastrointestinal: No abdominal pain.  No nausea, no vomiting.  No diarrhea.  No constipation. Genitourinary: Negative for dysuria. Musculoskeletal: Negative for back pain. Skin: Negative for rash. Neurological: Negative for headaches, focal weakness or numbness.  ____________________________________________   PHYSICAL  EXAM:  VITAL SIGNS: ED Triage Vitals [08/10/21 1148]  Enc Vitals Group     BP (!) 86/49     Pulse Rate 97     Resp 17     Temp 98.6 F (37 C)     Temp Source Oral     SpO2 93 %    Constitutional: Drowsy but intermittently alert if engaged.  Eyes: Conjunctivae are normal. PERRL (4 mm).  Head: Atraumatic. Nose: No congestion/rhinnorhea. Mouth/Throat: Mucous membranes are dry.  Neck: No stridor.  Cardiovascular: Normal rate, regular rhythm. Good peripheral circulation. Grossly normal heart sounds.   Respiratory: Normal respiratory effort.  No retractions. Lungs CTAB. Gastrointestinal: Soft and nontender. No distention.  Musculoskeletal: No lower extremity tenderness nor edema. No gross deformities of extremities. Neurologic:  Speech is slightly slurred. No gross focal neurologic deficits are appreciated.  Skin:  Skin is warm, dry and intact. No rash noted.   ____________________________________________   LABS (all labs ordered are listed, but only abnormal results are displayed)  Labs Reviewed  COMPREHENSIVE METABOLIC PANEL - Abnormal; Notable for the following components:      Result Value   Creatinine, Ser 1.51 (*)    Albumin 3.4 (*)    GFR, Estimated 56 (*)    All other components within normal limits  SALICYLATE LEVEL - Abnormal; Notable for the following components:   Salicylate Lvl <6.7 (*)    All other components within normal limits  RESP PANEL BY RT-PCR (FLU A&B, COVID) ARPGX2  CK  ACETAMINOPHEN LEVEL  ETHANOL  CBC  WITH DIFFERENTIAL/PLATELET  MAGNESIUM  RAPID URINE DRUG SCREEN, HOSP PERFORMED   ____________________________________________  EKG   EKG Interpretation  Date/Time:  Friday August 10 2021 11:46:02 EDT Ventricular Rate:  97 PR Interval:  106 QRS Duration: 88 QT Interval:  335 QTC Calculation: 426 R Axis:   74 Text Interpretation: Sinus rhythm Short PR interval Confirmed by Nanda Quinton [phone]) on 08/10/2021 12:31:03 PM         ____________________________________________  RADIOLOGY  DG Chest Portable 1 View  Result Date: 08/10/2021 CLINICAL DATA:  SOB - OD EXAM: PORTABLE CHEST 1 VIEW COMPARISON:  Chest radiographs 09/16/2020. FINDINGS: The heart size and mediastinal contours are within normal limits. Both lungs are clear. No visible pleural effusions or pneumothorax. No acute osseous abnormality. IMPRESSION: No active disease. Electronically Signed   By: Margaretha Sheffield M.D.   On: 08/10/2021 12:09    ____________________________________________   PROCEDURES  Procedure(s) performed:   Procedures  CRITICAL CARE Performed by: Margette Fast Total critical care time: 35 minutes Critical care time was exclusive of separately billable procedures and treating other patients. Critical care was necessary to treat or prevent imminent or life-threatening deterioration. Critical care was time spent personally by me on the following activities: development of treatment plan with patient and/or surrogate as well as nursing, discussions with consultants, evaluation of patient's response to treatment, examination of patient, obtaining history from patient or surrogate, ordering and performing treatments and interventions, ordering and review of laboratory studies, ordering and review of radiographic studies, pulse oximetry and re-evaluation of patient's condition.  Nanda Quinton, MD Emergency Medicine  ____________________________________________   INITIAL IMPRESSION / ASSESSMENT AND PLAN / ED COURSE  Pertinent labs & imaging results that were available during my care of the patient were reviewed by me and considered in my medical decision making (see chart for details).   This patient is Presenting for Evaluation of AMS, which does require a range of treatment options, and is a complaint that involves a high risk of morbidity and mortality.  The Differential Diagnoses includes but is not exclusive to alcohol,  illicit or prescription medications, intracranial pathology such as stroke, intracerebral hemorrhage, fever or infectious causes including sepsis, hypoxemia, uremia, trauma, endocrine related disorders such as diabetes, hypoglycemia, thyroid-related diseases, etc.   Critical Interventions-    Medications  naloxone (NARCAN) injection 0.4 mg (has no administration in time range)  sodium chloride 0.9 % bolus 2,000 mL (0 mLs Intravenous Stopped 08/10/21 1330)  LORazepam (ATIVAN) injection 2 mg (2 mg Intravenous Given 08/10/21 1228)    Reassessment after intervention: Patient more awake with IVF. No narcan given. More belligerent but able to re-direct.   I did obtain Additional Historical Information from EMS, as the patient is altered.  I decided to review pertinent External Data, and in summary patient with prior ED presentations for polysubstance abuse.   Clinical Laboratory Tests Ordered, included lab work is reassuring.  No leukocytosis or anemia.  Creatinine 1.51 with normal BUN and electrolytes.  Alcohol, Tylenol, salicylate level all negative.  CK within normal limits.  Radiologic Tests Ordered, included CXR. I independently interpreted the images and agree with radiology interpretation.   Cardiac Monitor Tracing which shows NSR. No ectopy.   Social Determinants of Health Risk positive for polysubstance abuse.  Medical Decision Making: Summary:  Patient presents emergency department for evaluation after being found unresponsive on the side of the road.  No outward sign of trauma.  He awakens to voice and touch and is agitated  at times but able to redirect.  No Narcan required.  Will reach out to [place] control regarding potential Adderall OD/ingestion. Unknown timeframe for potential ingestion and patient denies this but unsure if I can trust this history.   Reevaluation with update and discussion with patient at 03:00 PM.  Patient is put on his street clothes.  He is up  and walking around his room.  He tells me that he was walking home and felt tired so he laid down.  He states someone called EMS.  He denies taking Adderall or other substances.  He states he was not trying to harm himself but that his pills spilled on the floor.  He adamantly denies any suicidal ideation.  He is up and walking around the room without difficulty.  He is eating a sandwich.  I suspect that he had an unintentional overdose likely opiate given his initial presentation.  He is speaking fairly clearly with me now and ambulating without difficulty.  I do not feel that he requires restraint/sedation with IVC.  I did send a prescription to his pharmacy for Narcan and list of community resources.   Disposition: discharge  ____________________________________________  FINAL CLINICAL IMPRESSION(S) / ED DIAGNOSES  Final diagnoses:  Accidental overdose, initial encounter     NEW OUTPATIENT MEDICATIONS STARTED DURING THIS VISIT:  New Prescriptions   NALOXONE (NARCAN) NASAL SPRAY 4 MG/0.1 ML    Administer in case of opiate overdose    Note:  This document was prepared using Dragon voice recognition software and may include unintentional dictation errors.  Nanda Quinton, MD, [facility] Emergency Medicine    Phill Steck, [facility], MD 08/10/21 [phone]

## 2021-08-10 NOTE — ED Notes (Signed)
Patient is now fully clothed and has pulled his monitor off again

## 2021-08-10 NOTE — ED Notes (Signed)
Attempted to call mother Lucita Ferrara [phone] no answer unable to leave message mail box full

## 2021-08-10 NOTE — ED Notes (Signed)
Patient continues to pull his monitor leads off and crying states nothing is wrong with him, acting bizarre . States he is hungry, I explained I would get him something after that, refuses to lay still for Ct.
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
## 2021-08-10 NOTE — Discharge Instructions (Signed)
Substance Abuse Treatment Programs ° °Intensive Outpatient Programs °[facility]     °[address]      °[place], [place]                   °[phone]      ° °[facility] °[address] °[place], [place] °[phone] ° °[facility]     °(Inpatient and outpatient)     °[address]           °[phone]   ° °[facility] °[phone] (Suboxone and Methadone) ° °[address]      °[place], [place] [address]      °336-882-2125      ° °[address] °[place], [place] °852-3033 ° °[facility] (Outpatient/Inpatient, [facility])    °(insurance only) [phone]      °       °[facility] (Groups & Residential) °[place], [place] °336-389-1413 ° °   °[facility]     °[address]     °[place], [place]      °[phone]      ° °[facility] (for caregivers and family) °[address] °[place], [place] °336-299-4655 ° ° ° ° ° °Residential Treatment Programs °[facility]      °[address], [place], [place] [address]  °([phone]      ° °[facility] °[address] [place], [place] [address] °[phone] ° °[facility]        °336-248-8914      ° °[facility] °1-800-659-3381 ° °[facility]             °[address]                                         °[place], [place]                                                °877-615-2722 or 336-784-9470                              ° °[facility] [place] °[address] °[place], [address] °1.877.941.8954 ° °[facility]    °[address]      °[place], [place]     °[phone]      ° °[facility] °[address] °[place], [place] °[phone] ° °[facility]   °[address]     °[place], [place] [address]     °[phone]      °Admissions: 8am-3pm M-F ° °[facility] °[address] °[place],  [place] °336-227-7417 ° °[facility]: Residential Program (90 Days)   °[place], [place]      °336-725-8389 or 800-758-6077    ° °[facility] °[place], [place] °(Walk in Hours over the weekend or by referral) ° °[facility] °[address], [place], [place] [address] °([phone]) [phone] ° °Crisis Mobile: [facility]:  1-877-626-1772 (for crisis response 24 hours a day) °[facility] Hotline:      1-800-256-2452 °Outpatient Psychiatry and Counseling ° °[facility]: Mobile Crisis   Management 24 hours:  1-877-626-1772 ° °[facility] sliding scale fee and walk in schedule: M-F 8am-12pm/1pm-3pm °[address]  °[place], [place] [address] °[phone] ° °[facility] °[address] °[place], [place] [address] °336-703-9650 ° °[facility] ([facility] known [facility])- new patient walk-in appointments available Monday - Friday 8am -3pm.          °[address] °[place], [place] [address] °336-676-6840 or crisis line- 336-676-6905 ° °[facility] Intensive Outpatient Therapy Program °[address] °[place], [place] [address] °336-832-9804 ° °[facility]                  °Crisis Services      °[phone]      °[address]     °[place], [place] [address]                ° °[facility]   °[facility] °[phone] °[address] °[place], [place] [address] ° ° °[facility]          °[address]  °[place], [place] [address]       °([phone] ° °[facility] °[address] °[place], [place] [address] °336-292-1510 ° °[facility]    °[address]    °[place], [place] [address]     °[phone]      ° °Parish McKinney, MD     °[address]     °[place] [address]     °336-282-1251     °  °[facility] °[address] °[place] [address] ° °[facility]     °[address]     °[place], [place]      °336-542-2076      ° °[facility] °Shamsher Ahluwalia, MD °[address] °[place], [place] [address] °[phone] ° °[facility]     °[address]     °[place], [place] [address]     °336-274-1237      ° °[facility] °[address] °[place], [place] [address] °336-854-4450 °Resources for Temporary Residential Assistance/Crisis Centers ° °DAY CENTERS °[facility] °M-F 8am-3pm   °[address] [place], [place] [address]   336-332-0824 °Services include: laundry, barbering, support groups, case management, phone  & computer access, showers, AA/NA mtgs, mental health/substance abuse nurse, job skills class, disability information, VA assistance, spiritual classes, etc.  ° °HOMELESS SHELTERS ° °[facility]     °[facility]   °[address], [place]     °[phone]       °       °[facility] (women and children)       °[address] °, [place] [address] °336-275-0820 °[email or web address] for application and process °Application Required ° °[facility]   °[address]    °[place] [address]     °[phone]       °             °[facility] °[address] °, [place] [address] °336.273.5572 °336-235-0363(schedule application appt.) °Application Required ° °[facility] (women only)    °[address]     °[place],  [address]     °336-884-1039      °  Intake starts 6pm daily °Need valid ID, SSC, & Police report °[facility] [place] °[address] °[place], [place] °336-881-5420 °Application Required ° °[facility] (men only)     °[address]      °[place], [place]     °[phone]      ° °[facility] °(Pregnant women only) °[address] °, [place] °336-275-0206 ° °[facility]      °[address]      °[place], [place] [address]     °[phone]      °       °[facility] °[address] °[place], [place] °336-723-1848 °90 day commitment/SA/Application process ° °[facility](men only)     °[address]     °[place], [place]     °[phone]       °Check-in at 7pm     °       °[facility] °[address] °[place], [place] [address] °336-248-6684 °Men/Women/Women and Children must be there by 7 pm ° °[facility] °[place], [place] °336-722-8721                ° °

## 2021-08-10 NOTE — ED Notes (Signed)
Patient found standing on the side of the stretcher, instructed patient to get back on his stretcher, explained high risk for fall. Patient assisted back to bed and re-attached to monitor.

## 2021-10-04 ENCOUNTER — Encounter: Payer: Self-pay | Admitting: Nurse Practitioner

## 2021-10-04 ENCOUNTER — Ambulatory Visit (INDEPENDENT_AMBULATORY_CARE_PROVIDER_SITE_OTHER): Payer: Commercial Managed Care - HMO | Admitting: Nurse Practitioner

## 2021-10-04 VITALS — BP 168/104 | HR 104 | Temp 97.2°F | Resp 18 | Ht 70.0 in | Wt 195.0 lb

## 2021-10-04 DIAGNOSIS — F411 Generalized anxiety disorder: Secondary | ICD-10-CM | POA: Diagnosis not present

## 2021-10-04 DIAGNOSIS — L0291 Cutaneous abscess, unspecified: Secondary | ICD-10-CM

## 2021-10-04 DIAGNOSIS — F1990 Other psychoactive substance use, unspecified, uncomplicated: Secondary | ICD-10-CM

## 2021-10-04 DIAGNOSIS — E663 Overweight: Secondary | ICD-10-CM

## 2021-10-04 DIAGNOSIS — F902 Attention-deficit hyperactivity disorder, combined type: Secondary | ICD-10-CM | POA: Diagnosis not present

## 2021-10-04 DIAGNOSIS — Z0000 Encounter for general adult medical examination without abnormal findings: Secondary | ICD-10-CM | POA: Diagnosis not present

## 2021-10-04 DIAGNOSIS — I10 Essential (primary) hypertension: Secondary | ICD-10-CM

## 2021-10-04 DIAGNOSIS — Z1211 Encounter for screening for malignant neoplasm of colon: Secondary | ICD-10-CM

## 2021-10-04 DIAGNOSIS — E038 Other specified hypothyroidism: Secondary | ICD-10-CM

## 2021-10-04 DIAGNOSIS — F1320 Sedative, hypnotic or anxiolytic dependence, uncomplicated: Secondary | ICD-10-CM

## 2021-10-04 MED ORDER — AMLODIPINE BESYLATE 5 MG PO TABS
5.0000 mg | ORAL_TABLET | Freq: Every day | ORAL | 0 refills | Status: DC
Start: 2021-10-04 — End: 2021-10-04

## 2021-10-04 MED ORDER — SULFAMETHOXAZOLE-TRIMETHOPRIM 800-160 MG PO TABS
1.0000 | ORAL_TABLET | Freq: Two times a day (BID) | ORAL | 0 refills | Status: DC
Start: 2021-10-04 — End: 2023-03-25

## 2021-10-04 MED ORDER — AMLODIPINE BESYLATE 5 MG PO TABS: 5.0000 mg | ORAL_TABLET | Freq: Every day | ORAL | 0 refills | Status: DC

## 2021-10-04 NOTE — Assessment & Plan Note (Signed)
Pending TSH

## 2021-10-04 NOTE — Assessment & Plan Note (Signed)
Patient has abscess in her left axilla.  We will treat with Bactrim.  Patient does have a history of MRSA
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
## 2021-10-04 NOTE — Assessment & Plan Note (Signed)
Patient is followed by psychiatry currently on alprazolam 2 mg 3 times daily

## 2021-10-04 NOTE — Assessment & Plan Note (Signed)
Has not used any IV drugs in approximately 6 months.

## 2021-10-04 NOTE — Assessment & Plan Note (Signed)
Discussed age-appropriate immunizations and screening exams

## 2021-10-04 NOTE — Patient Instructions (Addendum)
Nice to see you today I sent in antibiotics and a blood pressure medication Check your blood pressure 3 times a week if you can I want to see you in 1 month for a blood pressure recheck

## 2021-10-04 NOTE — Assessment & Plan Note (Signed)
Patient has history of dealing with elevated blood pressure.  Elevated in office today.  We will start patient on amlodipine 5 mg daily.  Check blood pressure at home follow-up 1 month

## 2021-10-05 LAB — COMPREHENSIVE METABOLIC PANEL
ALT: 15 U/L (ref 0–53)
AST: 15 U/L (ref 0–37)
Albumin: 4.1 g/dL (ref 3.5–5.2)
Alkaline Phosphatase: 69 U/L (ref 39–117)
BUN: 19 mg/dL (ref 6–23)
CO2: 25 mEq/L (ref 19–32)
Calcium: 9 mg/dL (ref 8.4–10.5)
Chloride: 101 mEq/L (ref 96–112)
Creatinine, Ser: 1.32 mg/dL (ref 0.40–1.50)
GFR: 63.25 mL/min (ref >60.00)
Glucose, Bld: 102 mg/dL — ABNORMAL HIGH (ref 70–99)
Potassium: 4.3 mEq/L (ref 3.5–5.1)
Sodium: 137 mEq/L (ref 135–145)
Total Bilirubin: 0.3 mg/dL (ref 0.2–1.2)
Total Protein: 7 g/dL (ref 6.0–8.3)

## 2021-10-05 LAB — CBC
HCT: 39 % (ref 39.0–52.0)
Hemoglobin: 13.4 g/dL (ref 13.0–17.0)
MCHC: 34.4 g/dL (ref 30.0–36.0)
MCV: 88.5 fl (ref 78.0–100.0)
Platelets: 309 10*3/uL (ref 150.0–400.0)
RBC: 4.4 Mil/uL (ref 4.22–5.81)
RDW: 13.4 % (ref 11.5–15.5)
WBC: 7.5 10*3/uL (ref 4.0–10.5)

## 2021-10-05 LAB — LIPID PANEL
Cholesterol: 145 mg/dL (ref 0–200)
HDL: 34.6 mg/dL — ABNORMAL LOW (ref >39.00)
NonHDL: 110.4
Total CHOL/HDL Ratio: 4
Triglycerides: 212 mg/dL — ABNORMAL HIGH (ref 0.0–149.0)
VLDL: 42.4 mg/dL — ABNORMAL HIGH (ref 0.0–40.0)

## 2021-10-05 LAB — LDL CHOLESTEROL, DIRECT: Direct LDL: 85 mg/dL

## 2021-10-05 LAB — HEMOGLOBIN A1C: Hgb A1c MFr Bld: 5.6 % (ref 4.6–6.5)

## 2021-10-05 LAB — TSH: TSH: 1.47 u[IU]/mL (ref 0.35–5.50)

## 2021-10-08 ENCOUNTER — Ambulatory Visit (INDEPENDENT_AMBULATORY_CARE_PROVIDER_SITE_OTHER): Payer: Commercial Managed Care - HMO | Admitting: Nurse Practitioner

## 2021-10-08 VITALS — BP 168/104 | HR 101 | Temp 97.8°F | Resp 18 | Ht 70.0 in | Wt 197.4 lb

## 2021-10-08 DIAGNOSIS — L989 Disorder of the skin and subcutaneous tissue, unspecified: Secondary | ICD-10-CM

## 2021-10-08 NOTE — Progress Notes (Signed)
   Acute Office Visit  Subjective:     Patient ID: ZAN ORLICK, male    DOB: 05/16/1970, 51 y.o.   MRN: [id number]  Chief Complaint  Patient presents with   Skin Problem    Started x 2 months ago. Red spot in the left upper chest area. Looked like it was a pimple and kept messing with it in the shower. It did turn black fir a while also. Now has white spots in the circle area.     HPI Patient is in today for Skin issue    States it has been there for apprxo 2 months. States that it looked like a white head and it would not pop. States that he got angry with it and scrubbed it and got the covering offi ti. States that he had a black scab and did not think about it. States that the balck came off and there are little with/clrear nodules that are coming out. Some blood came out also   Breifly reviewed the labs with patient at the end of the visit  Review of Systems  Constitutional:  Negative for chills and fever.  Skin:        "+" lesion         Objective:    BP (!) 168/104   Pulse (!) 101   Temp 97.8 F (36.6 C) (Temporal)   Resp 18   Ht '5\' 10"'$  (1.778 m)   Wt 197 lb 6 oz (89.5 kg)   SpO2 98%   BMI 28.32 kg/m    Physical Exam Vitals and nursing note reviewed.  Constitutional:      Appearance: Normal appearance.  Cardiovascular:     Rate and Rhythm: Normal rate and regular rhythm.  Pulmonary:     Effort: Pulmonary effort is normal.     Breath sounds: Normal breath sounds.  Skin:    Findings: Erythema and lesion present.          Comments: 2.5cm x 3cm   Neurological:     Mental Status: He is alert.      No results found for any visits on 10/08/21.      Assessment & Plan:   Problem List Items Addressed This Visit       Musculoskeletal and Integument   Skin lesion - Primary    Given history and lesion will cover for potential abscess.  Looks like it was starting to heal at some point until he opened the lesion.  I did see him last week and  he was prescribed Bactrim DS 1 tab twice daily he has not started medication yet encourage patient to pick prescription up to start medication as prescribed.  Also encourage patient to pick up prescription for amlodipine to start taking also.  If he does not notice some improvement within a week he will reach out to me to consider referring to [facility] dermatology.       No orders of the defined types were placed in this encounter.   Return in about 4 weeks (around 11/05/2021) for BP recheck.  Romilda Garret, NP

## 2021-10-08 NOTE — Assessment & Plan Note (Signed)
Given history and lesion will cover for potential abscess.  Looks like it was starting to heal at some point until he opened the lesion.  I did see him last week and he was prescribed Bactrim DS 1 tab twice daily he has not started medication yet encourage patient to pick prescription up to start medication as prescribed.  Also encourage patient to pick up prescription for amlodipine to start taking also.  If he does not notice some improvement within a week he will reach out to me to consider referring to [facility] dermatology.

## 2021-10-08 NOTE — Patient Instructions (Signed)
Clean it gently with soap and water. Keep it covered through the day to keep your clothing from aggravating it. You can leave the band aid off at night Pick up the antibiotic I sent in and start taking it. Also pick up the blood pressure medication Keep your appt with me in 1 month, follow up sooner if needed

## 2021-11-04 ENCOUNTER — Other Ambulatory Visit: Payer: Self-pay | Admitting: Nurse Practitioner

## 2021-11-04 DIAGNOSIS — I10 Essential (primary) hypertension: Secondary | ICD-10-CM

## 2021-11-05 NOTE — Telephone Encounter (Signed)
Called pt, no asnwer, left vm to call back

## 2021-11-05 NOTE — Telephone Encounter (Signed)
Please schedule for follow up.

## 2021-11-07 NOTE — Telephone Encounter (Signed)
Left message to call back  

## 2021-11-12 NOTE — Telephone Encounter (Signed)
No response from patient

## 2022-01-09 ENCOUNTER — Telehealth: Payer: Self-pay | Admitting: Nurse Practitioner

## 2022-01-09 DIAGNOSIS — I10 Essential (primary) hypertension: Secondary | ICD-10-CM

## 2022-01-09 NOTE — Telephone Encounter (Signed)
Tried to reach patient,call would not go through

## 2022-01-09 NOTE — Telephone Encounter (Signed)
Please try to reach patient for an appointment to follow up

## 2022-01-09 NOTE — Telephone Encounter (Signed)
Spoke with patient. Patient has not been taking his b/p medication, he forgets. He will worn on taking his medication daily and follow up on 01/31/22 to re access. No refill needed at this time

## 2022-01-21 ENCOUNTER — Ambulatory Visit: Payer: Commercial Managed Care - HMO | Admitting: Nurse Practitioner

## 2022-01-21 NOTE — Telephone Encounter (Signed)
Patient called in to follow up on this refill. He stated he never really started the amLODipine (NORVASC) 5 MG tablet. He has lost the medication. Please advise. Thank you!

## 2022-01-21 NOTE — Telephone Encounter (Signed)
Per last note it says he does not need a refill and that we will reassess at the office visit

## 2022-01-23 MED ORDER — AMLODIPINE BESYLATE 5 MG PO TABS: 5.0000 mg | ORAL_TABLET | Freq: Every day | ORAL | 0 refills | Status: DC

## 2022-01-23 NOTE — Telephone Encounter (Signed)
Script sent in

## 2022-01-23 NOTE — Addendum Note (Signed)
Addended by: Michela Pitcher on: 01/23/2022 05:17 PM   Modules accepted: Orders

## 2022-01-23 NOTE — Telephone Encounter (Signed)
Patient notified as instructed by telephone and verbalized understanding. Patient stated that he got the script filled and never started taking the medication. Patient stated that he has lost the bottle and needs another script sent in to the pharmacy. Pharmacy [facility]

## 2022-01-31 ENCOUNTER — Ambulatory Visit: Payer: Commercial Managed Care - HMO | Admitting: Nurse Practitioner

## 2022-03-05 ENCOUNTER — Other Ambulatory Visit: Payer: Self-pay | Admitting: Nurse Practitioner

## 2022-03-05 DIAGNOSIS — I10 Essential (primary) hypertension: Secondary | ICD-10-CM

## 2022-03-13 ENCOUNTER — Other Ambulatory Visit: Payer: Commercial Managed Care - HMO

## 2022-03-13 ENCOUNTER — Emergency Department: Payer: Commercial Managed Care - HMO

## 2022-03-13 ENCOUNTER — Emergency Department
Admission: EM | Admit: 2022-03-13 | Discharge: 2022-03-13 | Disposition: A | Discharge status: Home / Self Care | Payer: Commercial Managed Care - HMO | Attending: Emergency Medicine | Admitting: Emergency Medicine

## 2022-03-13 ENCOUNTER — Other Ambulatory Visit: Payer: Self-pay

## 2022-03-13 DIAGNOSIS — S4991XA Unspecified injury of right shoulder and upper arm, initial encounter: Secondary | ICD-10-CM | POA: Diagnosis present

## 2022-03-13 DIAGNOSIS — S43101A Unspecified dislocation of right acromioclavicular joint, initial encounter: Secondary | ICD-10-CM

## 2022-03-13 DIAGNOSIS — S43121A Dislocation of right acromioclavicular joint, 100%-200% displacement, initial encounter: Secondary | ICD-10-CM | POA: Diagnosis not present

## 2022-03-13 MED ORDER — KETOROLAC TROMETHAMINE 30 MG/ML IJ SOLN
30.0000 mg | Freq: Once | INTRAMUSCULAR | Status: AC
  Administered 2022-03-13: 30 mg via INTRAMUSCULAR
  Filled 2022-03-13: qty 1

## 2022-03-13 MED ORDER — HYDROMORPHONE HCL 1 MG/ML IJ SOLN
1.0000 mg | Freq: Once | INTRAMUSCULAR | Status: AC
  Administered 2022-03-13: 1 mg via INTRAMUSCULAR
  Filled 2022-03-13: qty 1

## 2022-03-13 NOTE — Discharge Instructions (Addendum)
Please take Tylenol and ibuprofen/Advil for your pain.  It is safe to take them together, or to alternate them every few hours.  Take up to '1000mg'$  of Tylenol at a time, up to 4 times per day.  Do not take more than 4000 mg of Tylenol in 24 hours.  For ibuprofen, take 400-600 mg, 3 - 4 times per day.  Follow-up with the orthopedic doctor in the clinic.  Keep your arm in sling, even when sleeping
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
## 2022-03-13 NOTE — ED Notes (Signed)
Applied Sling and swathe to RUE. Instructions given with demonstration

## 2022-03-14 ENCOUNTER — Telehealth: Payer: Self-pay

## 2022-03-14 NOTE — Telephone Encounter (Signed)
Transition Care Management Unsuccessful Follow-up Telephone Call  Date of discharge and from where:  TCM DC [facility] 03-13-22 Dx: right [facility] joint dislocation   Attempts:  1st Attempt  Reason for unsuccessful TCM follow-up call:  Missing or invalid number   Bigelow Gardner Direct Dial [phone]

## 2022-03-19 NOTE — Telephone Encounter (Signed)
Transition Care Management Unsuccessful Follow-up Telephone Call  Date of discharge and from where:    TCM DC [facility] 03-13-22 Dx: right [facility] joint dislocation     Attempts:  2nd Attempt  Reason for unsuccessful TCM follow-up call:  Left voice message   Juanda Crumble LPN [place] Direct Dial [phone]

## 2022-04-17 ENCOUNTER — Ambulatory Visit: Payer: Commercial Managed Care - HMO | Admitting: Family

## 2022-04-22 ENCOUNTER — Ambulatory Visit: Payer: Self-pay | Admitting: Family

## 2022-04-24 ENCOUNTER — Encounter: Payer: Self-pay | Admitting: Nurse Practitioner

## 2023-03-25 ENCOUNTER — Ambulatory Visit (INDEPENDENT_AMBULATORY_CARE_PROVIDER_SITE_OTHER): Payer: Self-pay | Admitting: Nurse Practitioner

## 2023-03-25 ENCOUNTER — Encounter: Payer: Self-pay | Admitting: Nurse Practitioner

## 2023-03-25 VITALS — BP 130/88 | HR 93 | Temp 97.8°F | Ht 68.0 in | Wt 219.2 lb

## 2023-03-25 DIAGNOSIS — Z13220 Encounter for screening for lipoid disorders: Secondary | ICD-10-CM

## 2023-03-25 DIAGNOSIS — F411 Generalized anxiety disorder: Secondary | ICD-10-CM

## 2023-03-25 DIAGNOSIS — Z1211 Encounter for screening for malignant neoplasm of colon: Secondary | ICD-10-CM

## 2023-03-25 DIAGNOSIS — L989 Disorder of the skin and subcutaneous tissue, unspecified: Secondary | ICD-10-CM

## 2023-03-25 DIAGNOSIS — E663 Overweight: Secondary | ICD-10-CM

## 2023-03-25 DIAGNOSIS — Z720 Tobacco use: Secondary | ICD-10-CM

## 2023-03-25 DIAGNOSIS — Z131 Encounter for screening for diabetes mellitus: Secondary | ICD-10-CM

## 2023-03-25 DIAGNOSIS — Z125 Encounter for screening for malignant neoplasm of prostate: Secondary | ICD-10-CM

## 2023-03-25 DIAGNOSIS — F988 Other specified behavioral and emotional disorders with onset usually occurring in childhood and adolescence: Secondary | ICD-10-CM

## 2023-03-25 DIAGNOSIS — E669 Obesity, unspecified: Secondary | ICD-10-CM

## 2023-03-25 DIAGNOSIS — G4700 Insomnia, unspecified: Secondary | ICD-10-CM

## 2023-03-25 DIAGNOSIS — I10 Essential (primary) hypertension: Secondary | ICD-10-CM

## 2023-03-25 DIAGNOSIS — Z0000 Encounter for general adult medical examination without abnormal findings: Secondary | ICD-10-CM

## 2023-03-25 DIAGNOSIS — E038 Other specified hypothyroidism: Secondary | ICD-10-CM

## 2023-03-25 LAB — LIPID PANEL
Cholesterol: 196 mg/dL (ref 0–200)
HDL: 32.7 mg/dL — ABNORMAL LOW (ref >39.00)
LDL Cholesterol: 136 mg/dL — ABNORMAL HIGH (ref 0–99)
NonHDL: 163.35
Total CHOL/HDL Ratio: 6
Triglycerides: 135 mg/dL (ref 0.0–149.0)
VLDL: 27 mg/dL (ref 0.0–40.0)

## 2023-03-25 LAB — COMPREHENSIVE METABOLIC PANEL
ALT: 14 U/L (ref 0–53)
AST: 15 U/L (ref 0–37)
Albumin: 4.5 g/dL (ref 3.5–5.2)
Alkaline Phosphatase: 81 U/L (ref 39–117)
BUN: 16 mg/dL (ref 6–23)
CO2: 27 meq/L (ref 19–32)
Calcium: 9.4 mg/dL (ref 8.4–10.5)
Chloride: 103 meq/L (ref 96–112)
Creatinine, Ser: 1.07 mg/dL (ref 0.40–1.50)
GFR: 80.54 mL/min (ref >60.00)
Glucose, Bld: 99 mg/dL (ref 70–99)
Potassium: 4.2 meq/L (ref 3.5–5.1)
Sodium: 140 meq/L (ref 135–145)
Total Bilirubin: 0.6 mg/dL (ref 0.2–1.2)
Total Protein: 8.1 g/dL (ref 6.0–8.3)

## 2023-03-25 LAB — TSH: TSH: 4.72 u[IU]/mL (ref 0.35–5.50)

## 2023-03-25 LAB — URINALYSIS, MICROSCOPIC ONLY

## 2023-03-25 LAB — CBC
HCT: 50.2 % (ref 39.0–52.0)
Hemoglobin: 17.5 g/dL — ABNORMAL HIGH (ref 13.0–17.0)
MCHC: 34.9 g/dL (ref 30.0–36.0)
MCV: 90.7 fL (ref 78.0–100.0)
Platelets: 341 10*3/uL (ref 150.0–400.0)
RBC: 5.53 Mil/uL (ref 4.22–5.81)
RDW: 13.1 % (ref 11.5–15.5)
WBC: 7.2 10*3/uL (ref 4.0–10.5)

## 2023-03-25 LAB — PSA: PSA: 1.03 ng/mL (ref 0.10–4.00)

## 2023-03-25 LAB — HEMOGLOBIN A1C: Hgb A1c MFr Bld: 5.5 % (ref 4.6–6.5)

## 2023-03-25 MED ORDER — ALPRAZOLAM 0.5 MG PO TABS: 0.5000 mg | ORAL_TABLET | Freq: Two times a day (BID) | ORAL | 0 refills | Status: DC | PRN

## 2023-03-25 NOTE — Assessment & Plan Note (Signed)
Patient is followed by psychiatry on Celexa 40 mg and alprazolam 2 mg 3 times daily as needed.  Patient states he has plenty of Celexa but ran out of alprazolam.  I will bridge patient with 0.5 mg twice daily for 10 days he is to follow-up with psychiatry for further refills and medication management

## 2023-03-25 NOTE — Assessment & Plan Note (Signed)
Patient currently followed by psychiatry.  Supposedly maintained on Adderall 20 mg.  He has appointment next Thursday.  Patient asked if I give him enough to get him through until he saw psychiatry I politely declined

## 2023-03-25 NOTE — Assessment & Plan Note (Signed)
Pending TSH, A1c, lipid panel.  Continue working healthy lifestyle modifications

## 2023-03-25 NOTE — Patient Instructions (Signed)
Nice to see you today I will be in touch with the labs once I have reviewed them  Follow up with me in 3 months, sooner if you need me

## 2023-03-25 NOTE — Assessment & Plan Note (Signed)
Pending TSH today.

## 2023-03-25 NOTE — Assessment & Plan Note (Signed)
Rash has been going on for 6 months.  Amatory referral to dermatology

## 2023-03-25 NOTE — Progress Notes (Signed)
Established Patient Office Visit  Subjective   Patient ID: [name], male    DOB: 03/19/1970  Age: 53 y.o. MRN: [id number]  Chief Complaint  Patient presents with   Annual Exam   Medication Refill    Xanax. Pt complains that his psychiatrist appointment is next Thursday. States he is out of script and is not doing well. Requests for small fill to hold him over.       HTN: was placed on amlodipine 5 mg and did not have a appropriate follow up. He does check his blood pressure at home. He will check it in the drug store and gets readings appreciable or getting in office today  ADD: Was on adderall and followed by Dr. Evelene Croon.  Patient has an appointment next Thursday.  Currently out of medication as he was recently incarcerated  GAD: on alprazolam and followed by Dr. Evelene Croon.  Patient has an appointment next Thursday currently on medication as he was incarcerated.  States his last quarter of a pill of Xanax was yesterday now is completely out.  Patient states that has been no interruption getting medication while being incarcerated  for complete physical and follow up of chronic conditions.  Immunizations: -Tetanus: Completed in 2018 -Influenza: refused  -Shingles: refused  -Pneumonia: Needs updating  Diet: Fair diet. 1 meal a day. No snacks. He will drink a little water and ginger Exercise: No regular exercise. Walking around the block every day   Eye exam: PRN. glasses Dental exam: Needs updating  Colonoscopy: was referral last year and did not follow up  Lung Cancer Screening: N/A  PSA: Due  Sleep: He is having trouble sleeping. He iwll sleep in 30 min intervals. States that he is having trouble without xanax       Review of Systems  Constitutional:  Positive for chills. Negative for fever.  Respiratory:  Negative for shortness of breath.   Cardiovascular:  Negative for chest pain and leg swelling.  Gastrointestinal:  Positive for diarrhea. Negative for  abdominal pain, blood in stool, constipation, nausea and vomiting.  Genitourinary:  Negative for dysuria and hematuria.  Neurological:  Negative for tingling and headaches.  Psychiatric/Behavioral:  Negative for hallucinations and suicidal ideas.       Objective:     BP 130/88   Pulse 93   Temp 97.8 F (36.6 C) (Oral)   Ht 5\' 8"  (1.727 m)   Wt 219 lb 3.2 oz (99.4 kg)   SpO2 99%   BMI 33.33 kg/m  BP Readings from Last 3 Encounters:  03/25/23 130/88  03/13/22 (!) 141/107  10/08/21 (!) 168/104   Wt Readings from Last 3 Encounters:  03/25/23 219 lb 3.2 oz (99.4 kg)  03/13/22 200 lb (90.7 kg)  10/08/21 197 lb 6 oz (89.5 kg)   SpO2 Readings from Last 3 Encounters:  03/25/23 99%  03/13/22 96%  10/08/21 98%      Physical Exam Vitals and nursing note reviewed.  Constitutional:      Appearance: Normal appearance.  HENT:     Right Ear: Tympanic membrane, ear canal and external ear normal.     Left Ear: Tympanic membrane, ear canal and external ear normal.     Mouth/Throat:     Mouth: Mucous membranes are moist.     Pharynx: Oropharynx is clear.  Eyes:     Extraocular Movements: Extraocular movements intact.     Pupils: Pupils are equal, round, and reactive to light.  Cardiovascular:  Rate and Rhythm: Normal rate and regular rhythm.     Pulses: Normal pulses.     Heart sounds: Normal heart sounds.  Pulmonary:     Effort: Pulmonary effort is normal.     Breath sounds: Normal breath sounds.  Abdominal:     General: Bowel sounds are normal. There is no distension.     Palpations: There is no mass.     Tenderness: There is no abdominal tenderness.     Hernia: No hernia is present.  Musculoskeletal:     Right lower leg: No edema.     Left lower leg: No edema.  Lymphadenopathy:     Cervical: No cervical adenopathy.  Skin:    General: Skin is warm.  Neurological:     General: No focal deficit present.     Mental Status: He is alert.     Deep Tendon Reflexes:      Reflex Scores:      Bicep reflexes are 2+ on the right side and 2+ on the left side.      Patellar reflexes are 2+ on the right side and 2+ on the left side.    Comments: Bilateral upper and lower extremity strength 5/5  Psychiatric:        Mood and Affect: Mood normal.        Behavior: Behavior normal.        Thought Content: Thought content normal.        Judgment: Judgment normal.      No results found for any visits on 03/25/23.    The 10-year ASCVD risk score (Arnett DK, et al., 2019) is: 9.2%    Assessment & Plan:   Problem List Items Addressed This Visit       Cardiovascular and Mediastinum   Primary hypertension   Historical diagnosis patient is been off antihypertensives for extended period time.  Does check blood pressure intermittently that she will within normal limits.  We will continue with lifestyle modifications hold antihypertensive therapy at this juncture      Relevant Orders   Comprehensive metabolic panel   TSH   CBC     Endocrine   Subclinical hypothyroidism   Pending TSH today        Musculoskeletal and Integument   Skin lesion   Rash has been going on for 6 months.  Amatory referral to dermatology      Relevant Orders   Ambulatory referral to Dermatology     Other   GAD (generalized anxiety disorder)   Patient is followed by psychiatry on Celexa 40 mg and alprazolam 2 mg 3 times daily as needed.  Patient states he has plenty of Celexa but ran out of alprazolam.  I will bridge patient with 0.5 mg twice daily for 10 days he is to follow-up with psychiatry for further refills and medication management      Relevant Medications   citalopram (CELEXA) 40 MG tablet   ALPRAZolam (XANAX) 0.5 MG tablet   Other Relevant Orders   TSH   Insomnia   Continue taking alprazolam as prescribed follow-up with psychiatry as recommended      ADD (attention deficit disorder)   Patient currently followed by psychiatry.  Supposedly maintained on  Adderall 20 mg.  He has appointment next Thursday.  Patient asked if I give him enough to get him through until he saw psychiatry I politely declined      Preventative health care - Primary   Discussed age-appropriate immunizations and  screening exams.  Did review patient's personal, surgical, social, family histories.  Patient is up-to-date on all age-appropriate vaccinations he would like.  Patient refused flu and shingles vaccine.  Ambulatory order placed for GI for CRC screening.  PSA for prostate cancer screening today.  Patient was given information at discharge about preventative healthcare maintenance with anticipatory guidance.      Obesity (BMI 30-39.9)   Pending TSH, A1c, lipid panel.  Continue working healthy lifestyle modifications      Tobacco abuse   Pending urine microscopy for microscopic hematuria      Relevant Orders   Urine Microscopic   Other Visit Diagnoses       Screening for lipid disorders       Relevant Orders   Lipid panel     Screening for diabetes mellitus       Relevant Orders   Hemoglobin A1c     Screening for prostate cancer       Relevant Orders   PSA     Screening for colon cancer       Relevant Orders   Ambulatory referral to Gastroenterology       Return in about 3 months (around 06/22/2023) for BP recheck.    Audria Nine, NP

## 2023-03-25 NOTE — Assessment & Plan Note (Signed)
Continue taking alprazolam as prescribed follow-up with psychiatry as recommended

## 2023-03-25 NOTE — Assessment & Plan Note (Signed)
Pending urine microscopy for microscopic hematuria

## 2023-03-25 NOTE — Assessment & Plan Note (Signed)
Discussed age-appropriate immunizations and screening exams.  Did review patient's personal, surgical, social, family histories.  Patient is up-to-date on all age-appropriate vaccinations he would like.  Patient refused flu and shingles vaccine.  Ambulatory order placed for GI for CRC screening.  PSA for prostate cancer screening today.  Patient was given information at discharge about preventative healthcare maintenance with anticipatory guidance.

## 2023-03-25 NOTE — Assessment & Plan Note (Signed)
Historical diagnosis patient is been off antihypertensives for extended period time.  Does check blood pressure intermittently that she will within normal limits.  We will continue with lifestyle modifications hold antihypertensive therapy at this juncture

## 2023-03-27 ENCOUNTER — Encounter: Payer: Self-pay | Admitting: Nurse Practitioner

## 2023-04-09 ENCOUNTER — Telehealth: Payer: Self-pay

## 2023-04-09 ENCOUNTER — Telehealth: Payer: Self-pay | Admitting: Nurse Practitioner

## 2023-04-09 DIAGNOSIS — Z8611 Personal history of tuberculosis: Secondary | ICD-10-CM

## 2023-04-09 NOTE — Telephone Encounter (Signed)
 We can setup a lab visit for a quantiferon gold lab draw. Order has been placed

## 2023-04-09 NOTE — Telephone Encounter (Signed)
 Do we know of a clinic that is accepting new patients

## 2023-04-09 NOTE — Telephone Encounter (Signed)
 Copied from CRM [id number]. Topic: Referral - Status >> Apr 09, 2023 12:37 PM [name] wrote: Reason for CRM: Patient calling in for dermatology status. I let patient know that the referral was denied due to the clinic not accepting any new patients. Patient would like to know if [name] could send in a new referral that is close in proximity to and that is accepting new patients.

## 2023-04-09 NOTE — Telephone Encounter (Unsigned)
 Copied from CRM [id number]. Topic: Clinical - Medical Advice >> Apr 09, 2023 12:39 PM [name] wrote: Reason for CRM: Patient stated that he had discussed with Mordecai Maes that when he was in jail, he tested positive for tuberculosis a year ago and was given medication for 8 weeks but nothing was ever mentioned on whether he was no longer positive. Patient would like to know if Fayrene Fearing wanted to set up an appointment regarding this or what the next steps look like.

## 2023-04-10 NOTE — Telephone Encounter (Signed)
 Pt has been scheduled for lab visit tomorrow. No further questions or concerns.

## 2023-04-11 ENCOUNTER — Other Ambulatory Visit: Payer: Self-pay

## 2023-05-01 ENCOUNTER — Telehealth: Payer: Self-pay | Admitting: Nurse Practitioner

## 2023-05-01 NOTE — Telephone Encounter (Signed)
 Got notice that de referral is out of network can we send else where

## 2023-05-24 IMAGING — CR DG FOOT COMPLETE 3+V*L*
3 series · 3 of 3 positions shown · non-contrast
Comparison: 09/16/2020

CLINICAL DATA: Left foot and ankle pain and swelling for several
weeks, no known injury

EXAM:
LEFT ANKLE COMPLETE - 3+ VIEW; LEFT FOOT - COMPLETE 3+ VIEW

[x foot lat left]
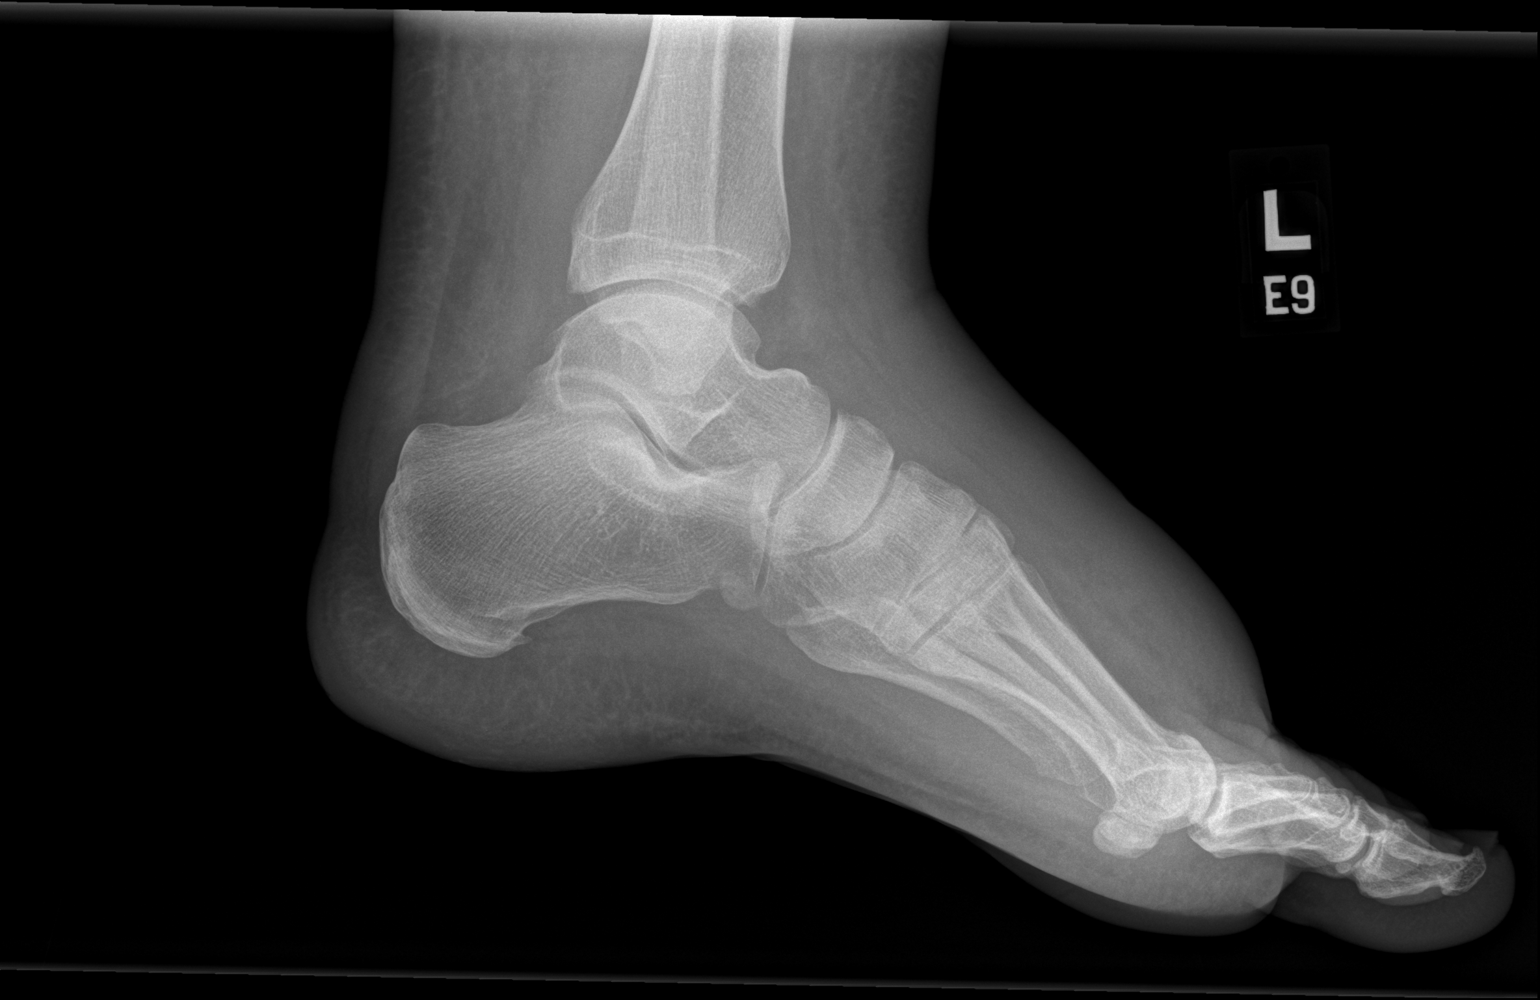

[x foot ap left]
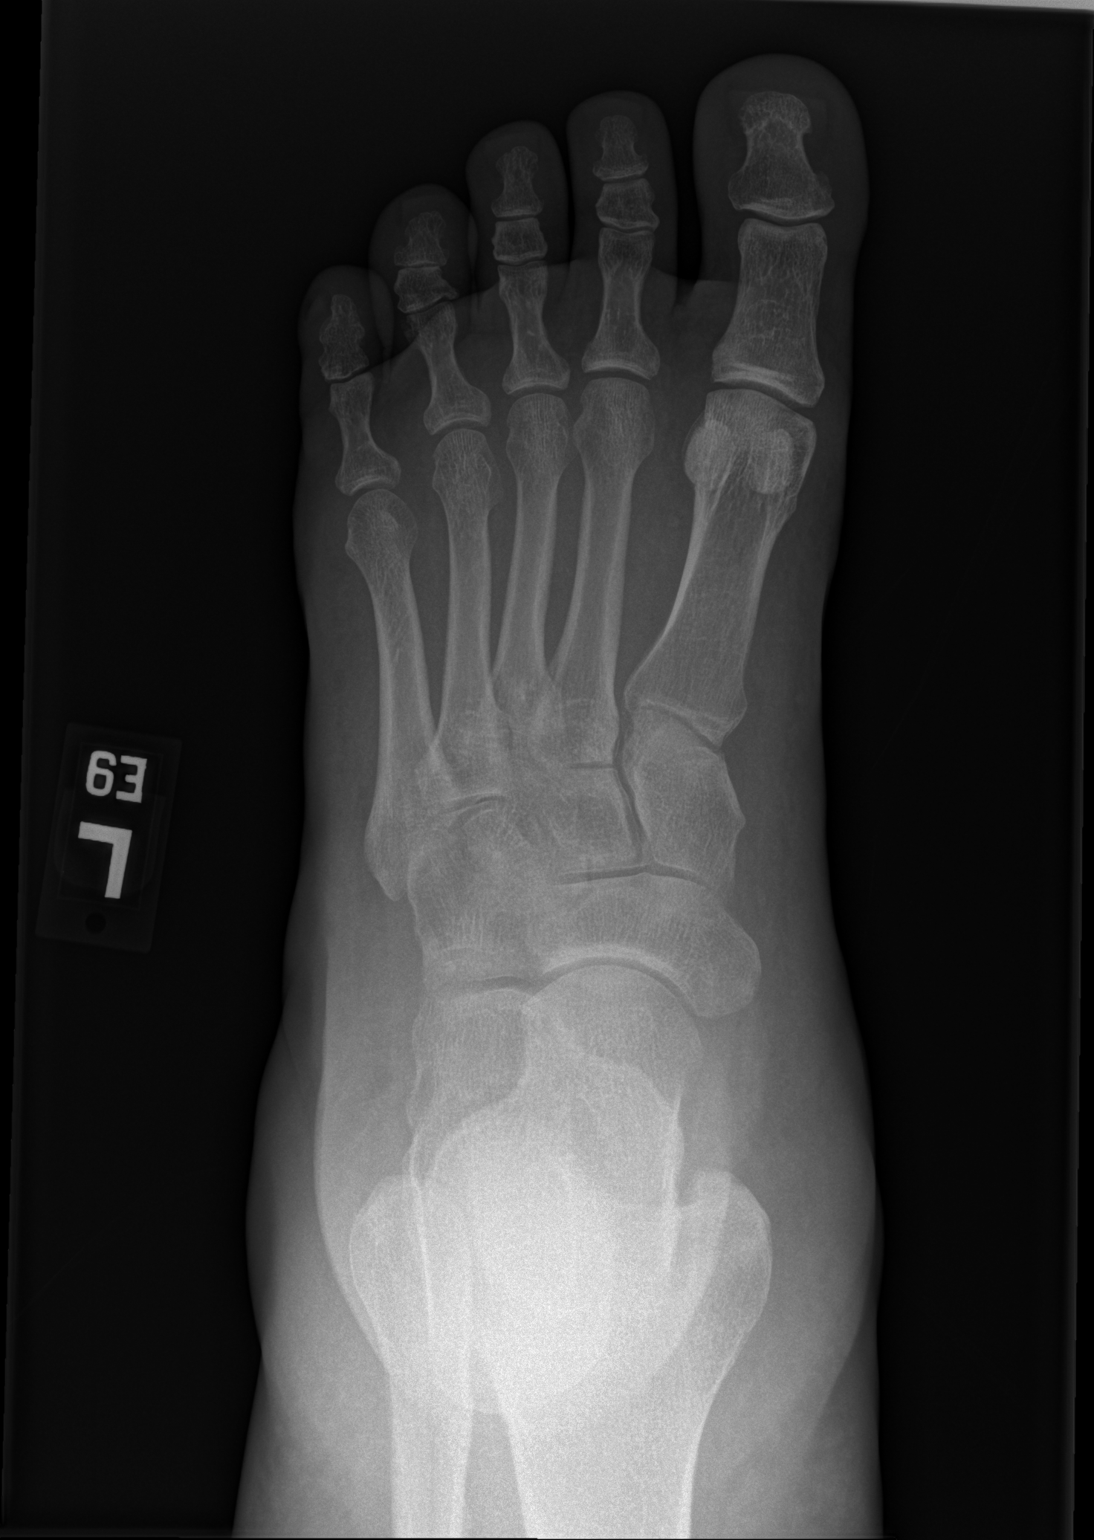

[x foot obl left]
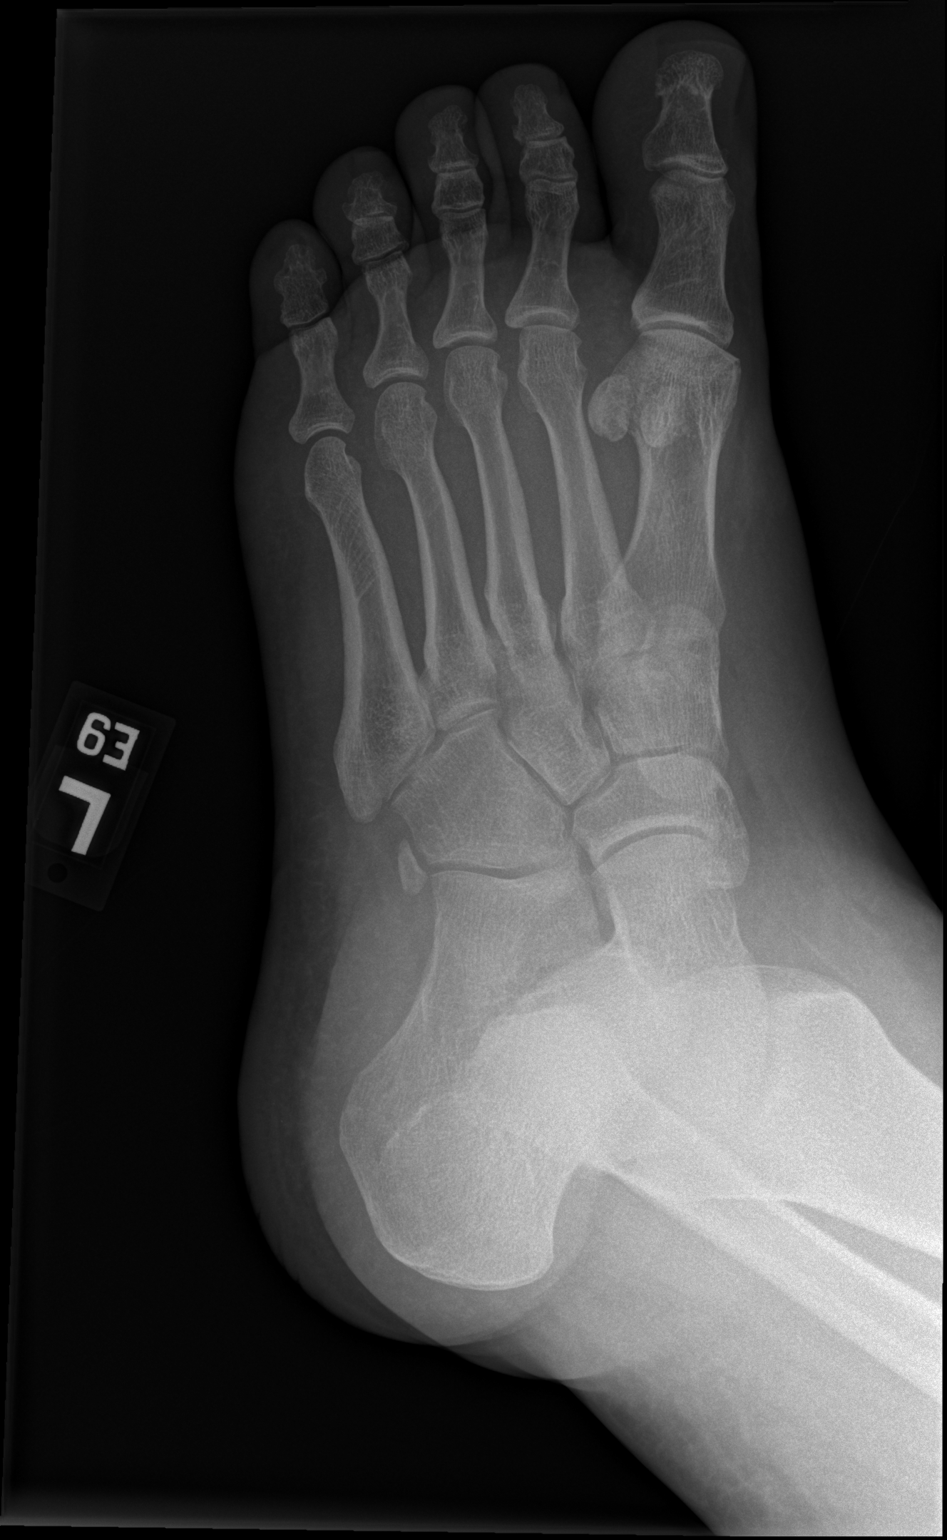

[3 of 3 positions shown; findings below may reference images not displayed]

FINDINGS: No fracture or dislocation of the left foot or ankle. There is
severe, diffuse soft tissue edema about the foot and ankle, which is
increased compared to prior examination.
IMPRESSION: No fracture or dislocation of the left foot or ankle. There is
severe, diffuse soft tissue edema about the foot and ankle,
increased compared to prior examination.

## 2023-06-05 ENCOUNTER — Telehealth: Payer: Self-pay | Admitting: Nurse Practitioner

## 2023-06-05 NOTE — Telephone Encounter (Signed)
 Can we call the patient and have him schedule with Dr. Geralyn Knee for his shoulder

## 2023-06-05 NOTE — Telephone Encounter (Signed)
 Copied from CRM [id number]. Topic: Referral - Request for Referral >> Jun 05, 2023 12:36 PM [place] E wrote: Did the patient discuss referral with their provider in the last year? Yes (If No - schedule appointment) (If Yes - send message)  Appointment offered? No  Type of order/referral and detailed reason for visit: Patient is wanting a referral for an Orthopedic provider for his left shoulder pain.  Preference of office, provider, location: Whoever Dr. Tivis Forster recommends, patient just wants to make sure this provider will be in-network with his insurance.  If referral order, have you been seen by this specialty before? No (If Yes, this issue or another issue? When? Where?  Can we respond through MyChart? Yes

## 2023-06-05 NOTE — Telephone Encounter (Signed)
 Spoke to pt, scheduled ov for 06/11/23

## 2023-06-10 NOTE — Progress Notes (Deleted)
     [name] T. Manuel Dettmann, MD, CAQ Sports Medicine [name] [address] [place], [address]  Phone: [phone]  FAX: [phone]  DANTHONY STONEBREAKER [name] - 53 y.o. male  MRN [id number]  Date of Birth: 1970-05-23  Date: 06/11/2023  PCP: Dorothe Gaster, NP  Referral: Dorothe Gaster, NP  No chief complaint on file.  Subjective:   [name] is a 53 y.o. very pleasant male patient with There is no height or weight on file to calculate BMI. who presents with the following:  Patient presents with some ongoing shoulder pain.  On chart review, patient is on Suboxone .    Review of Systems is noted in the HPI, as appropriate  Objective:   There were no vitals taken for this visit.  GEN: No acute distress; alert,appropriate. PULM: Breathing comfortably in no respiratory distress PSYCH: Normally interactive.   Laboratory and Imaging Data:  Assessment and Plan:   ***

## 2023-06-11 ENCOUNTER — Ambulatory Visit: Payer: Self-pay | Admitting: Family Medicine

## 2023-06-15 NOTE — Progress Notes (Unsigned)
     Montell Leopard T. Alinah Sheard, MD, CAQ Sports Medicine [facility] [address] [place], [address]  Phone: [phone]  FAX: [phone]  METE STAHLBERG II - 53 y.o. male  MRN [id number]  Date of Birth: [birth year]  Date: 06/16/2023  PCP: Dorothe Gaster, NP  Referral: Dorothe Gaster, NP  No chief complaint on file.  Subjective:   AKEIL STEER II is a 53 y.o. very pleasant male patient with There is no height or weight on file to calculate BMI. who presents with the following:  The patient presents with some ongoing left-sided shoulder pain.  On chart review, it does look like he had a prior significant right-sided AC joint separation, but I do not not see anything major with the left.    Review of Systems is noted in the HPI, as appropriate  Objective:   There were no vitals taken for this visit.  GEN: No acute distress; alert,appropriate. PULM: Breathing comfortably in no respiratory distress PSYCH: Normally interactive.   Laboratory and Imaging Data:  Assessment and Plan:   ***

## 2023-06-16 ENCOUNTER — Encounter: Payer: Self-pay | Admitting: Family Medicine

## 2023-06-16 ENCOUNTER — Ambulatory Visit (INDEPENDENT_AMBULATORY_CARE_PROVIDER_SITE_OTHER)
Admission: RE | Admit: 2023-06-16 | Discharge: 2023-06-16 | Disposition: A | Discharge status: Home / Self Care | Payer: Self-pay | Source: Ambulatory Visit | Attending: Family Medicine | Admitting: Family Medicine

## 2023-06-16 ENCOUNTER — Ambulatory Visit (INDEPENDENT_AMBULATORY_CARE_PROVIDER_SITE_OTHER): Payer: Self-pay | Admitting: Family Medicine

## 2023-06-16 VITALS — BP 116/70 | HR 74 | Temp 97.7°F | Ht 68.0 in | Wt 234.0 lb

## 2023-06-16 DIAGNOSIS — M7502 Adhesive capsulitis of left shoulder: Secondary | ICD-10-CM

## 2023-06-16 DIAGNOSIS — R29898 Other symptoms and signs involving the musculoskeletal system: Secondary | ICD-10-CM

## 2023-06-16 DIAGNOSIS — M25512 Pain in left shoulder: Secondary | ICD-10-CM

## 2023-06-16 DIAGNOSIS — M25612 Stiffness of left shoulder, not elsewhere classified: Secondary | ICD-10-CM

## 2023-06-23 ENCOUNTER — Ambulatory Visit: Payer: BLUE CROSS/BLUE SHIELD | Admitting: Nurse Practitioner

## 2023-06-25 ENCOUNTER — Ambulatory Visit: Payer: Self-pay | Admitting: Family Medicine

## 2023-09-06 ENCOUNTER — Ambulatory Visit (HOSPITAL_COMMUNITY): Payer: Self-pay

## 2023-10-31 ENCOUNTER — Telehealth: Payer: Self-pay | Admitting: Nurse Practitioner

## 2023-10-31 NOTE — Telephone Encounter (Signed)
 Called the number in the message and it goes to his mother. The number in the chart for him does not have VM. Called other number back and got his mother. She will call him to call the office.

## 2023-10-31 NOTE — Telephone Encounter (Signed)
 Copied from CRM [id number]. Topic: General - Other >> Oct 31, 2023 11:58 AM [place] A wrote: Reason for CRM: Patient called to speak with Clotilda regarding a document that he needed completed, patient stated that it has been while. Please contact

## 2023-10-31 NOTE — Telephone Encounter (Unsigned)
 Copied from CRM #[id number]. Topic: General - Other >> Oct 31, 2023  3:02 PM Mercedes MATSU wrote: Reason for CRM: Patient requesting call back from [facility] and can be reached at [phone]

## 2023-10-31 NOTE — Telephone Encounter (Signed)
 This is about his mother. Not him.

## 2023-10-31 NOTE — Telephone Encounter (Signed)
 Again, this is about his mother. Not him. See her chart for further information.

## 2023-12-08 DIAGNOSIS — F17210 Nicotine dependence, cigarettes, uncomplicated: Secondary | ICD-10-CM | POA: Diagnosis not present

## 2023-12-08 DIAGNOSIS — F332 Major depressive disorder, recurrent severe without psychotic features: Secondary | ICD-10-CM | POA: Diagnosis not present

## 2023-12-15 DIAGNOSIS — F17210 Nicotine dependence, cigarettes, uncomplicated: Secondary | ICD-10-CM | POA: Diagnosis not present

## 2023-12-24 DIAGNOSIS — Z419 Encounter for procedure for purposes other than remedying health state, unspecified: Secondary | ICD-10-CM | POA: Diagnosis not present

## 2023-12-25 ENCOUNTER — Telehealth: Payer: Self-pay

## 2023-12-25 NOTE — Telephone Encounter (Signed)
 Called both numbers on file left message to call office on one. One mail box was full. Just need to see if patient would like to still see dermatology. Referral is still open.   Please provide message from provider/office when call is returned from patient.

## 2024-01-06 ENCOUNTER — Emergency Department (HOSPITAL_COMMUNITY)

## 2024-01-06 ENCOUNTER — Other Ambulatory Visit: Payer: Self-pay

## 2024-01-06 ENCOUNTER — Encounter (HOSPITAL_COMMUNITY): Payer: Self-pay | Admitting: Hospitalist

## 2024-01-06 ENCOUNTER — Inpatient Hospital Stay (HOSPITAL_COMMUNITY)
Admission: EM | Admit: 2024-01-06 | Discharge: 2024-01-08 | DRG: 602 | Discharge status: Against Medical Advice | Attending: Internal Medicine | Admitting: Internal Medicine

## 2024-01-06 DIAGNOSIS — F17210 Nicotine dependence, cigarettes, uncomplicated: Secondary | ICD-10-CM | POA: Diagnosis not present

## 2024-01-06 DIAGNOSIS — Z886 Allergy status to analgesic agent status: Secondary | ICD-10-CM

## 2024-01-06 DIAGNOSIS — I10 Essential (primary) hypertension: Secondary | ICD-10-CM | POA: Diagnosis not present

## 2024-01-06 DIAGNOSIS — M79643 Pain in unspecified hand: Secondary | ICD-10-CM | POA: Diagnosis not present

## 2024-01-06 DIAGNOSIS — R609 Edema, unspecified: Secondary | ICD-10-CM | POA: Diagnosis not present

## 2024-01-06 DIAGNOSIS — R600 Localized edema: Secondary | ICD-10-CM | POA: Diagnosis not present

## 2024-01-06 DIAGNOSIS — M79632 Pain in left forearm: Secondary | ICD-10-CM | POA: Diagnosis not present

## 2024-01-06 DIAGNOSIS — F10231 Alcohol dependence with withdrawal delirium: Secondary | ICD-10-CM | POA: Diagnosis not present

## 2024-01-06 DIAGNOSIS — F1111 Opioid abuse, in remission: Secondary | ICD-10-CM | POA: Diagnosis not present

## 2024-01-06 DIAGNOSIS — Z8739 Personal history of other diseases of the musculoskeletal system and connective tissue: Secondary | ICD-10-CM

## 2024-01-06 DIAGNOSIS — L03114 Cellulitis of left upper limb: Secondary | ICD-10-CM | POA: Diagnosis not present

## 2024-01-06 DIAGNOSIS — M726 Necrotizing fasciitis: Secondary | ICD-10-CM | POA: Diagnosis present

## 2024-01-06 DIAGNOSIS — F419 Anxiety disorder, unspecified: Secondary | ICD-10-CM | POA: Diagnosis present

## 2024-01-06 DIAGNOSIS — Z5329 Procedure and treatment not carried out because of patient's decision for other reasons: Secondary | ICD-10-CM | POA: Diagnosis not present

## 2024-01-06 DIAGNOSIS — F32A Depression, unspecified: Secondary | ICD-10-CM | POA: Diagnosis present

## 2024-01-06 DIAGNOSIS — Z8614 Personal history of Methicillin resistant Staphylococcus aureus infection: Secondary | ICD-10-CM

## 2024-01-06 DIAGNOSIS — Z83438 Family history of other disorder of lipoprotein metabolism and other lipidemia: Secondary | ICD-10-CM | POA: Diagnosis not present

## 2024-01-06 DIAGNOSIS — Z8249 Family history of ischemic heart disease and other diseases of the circulatory system: Secondary | ICD-10-CM | POA: Diagnosis not present

## 2024-01-06 DIAGNOSIS — Z885 Allergy status to narcotic agent status: Secondary | ICD-10-CM

## 2024-01-06 DIAGNOSIS — Z833 Family history of diabetes mellitus: Secondary | ICD-10-CM

## 2024-01-06 DIAGNOSIS — F1511 Other stimulant abuse, in remission: Secondary | ICD-10-CM | POA: Diagnosis present

## 2024-01-06 DIAGNOSIS — M7989 Other specified soft tissue disorders: Secondary | ICD-10-CM | POA: Diagnosis not present

## 2024-01-06 DIAGNOSIS — Z79899 Other long term (current) drug therapy: Secondary | ICD-10-CM

## 2024-01-06 DIAGNOSIS — Z87442 Personal history of urinary calculi: Secondary | ICD-10-CM

## 2024-01-06 DIAGNOSIS — S59912A Unspecified injury of left forearm, initial encounter: Secondary | ICD-10-CM | POA: Diagnosis not present

## 2024-01-06 DIAGNOSIS — R000 Tachycardia, unspecified: Secondary | ICD-10-CM | POA: Diagnosis present

## 2024-01-06 LAB — RAPID URINE DRUG SCREEN, HOSP PERFORMED
Amphetamines: POSITIVE — AB
Barbiturates: NOT DETECTED
Benzodiazepines: POSITIVE — AB
Cocaine: NOT DETECTED
Opiates: NOT DETECTED
Tetrahydrocannabinol: NOT DETECTED

## 2024-01-06 LAB — URINALYSIS, ROUTINE W REFLEX MICROSCOPIC
Bacteria, UA: NONE SEEN
Bilirubin Urine: NEGATIVE
Glucose, UA: NEGATIVE mg/dL
Hgb urine dipstick: NEGATIVE
Ketones, ur: NEGATIVE mg/dL
Leukocytes,Ua: NEGATIVE
Nitrite: NEGATIVE
Protein, ur: NEGATIVE mg/dL
Specific Gravity, Urine: 1.018 (ref 1.005–1.030)
pH: 6 (ref 5.0–8.0)

## 2024-01-06 LAB — CBC
HCT: 41.4 % (ref 39.0–52.0)
HCT: 39.3 % (ref 39.0–52.0)
Hemoglobin: 14.2 g/dL (ref 13.0–17.0)
Hemoglobin: 13.3 g/dL (ref 13.0–17.0)
MCH: 30.3 pg (ref 26.0–34.0)
MCH: 30.2 pg (ref 26.0–34.0)
MCHC: 34.3 g/dL (ref 30.0–36.0)
MCHC: 33.8 g/dL (ref 30.0–36.0)
MCV: 88.5 fL (ref 80.0–100.0)
MCV: 89.3 fL (ref 80.0–100.0)
Platelets: 217 K/uL (ref 150–400)
Platelets: 201 K/uL (ref 150–400)
RBC: 4.68 MIL/uL (ref 4.22–5.81)
RBC: 4.4 MIL/uL (ref 4.22–5.81)
RDW: 12.4 % (ref 11.5–15.5)
RDW: 12.3 % (ref 11.5–15.5)
WBC: 7.8 K/uL (ref 4.0–10.5)
WBC: 7.7 K/uL (ref 4.0–10.5)
nRBC: 0 % (ref 0.0–0.2)
nRBC: 0 % (ref 0.0–0.2)

## 2024-01-06 LAB — BASIC METABOLIC PANEL WITH GFR
Anion gap: 10 (ref 5–15)
BUN: 17 mg/dL (ref 6–20)
CO2: 22 mmol/L (ref 22–32)
Calcium: 8.6 mg/dL — ABNORMAL LOW (ref 8.9–10.3)
Chloride: 105 mmol/L (ref 98–111)
Creatinine, Ser: 0.85 mg/dL (ref 0.61–1.24)
GFR, Estimated: >60 mL/min (ref >60)
Glucose, Bld: 116 mg/dL — ABNORMAL HIGH (ref 70–99)
Potassium: 3.7 mmol/L (ref 3.5–5.1)
Sodium: 137 mmol/L (ref 135–145)

## 2024-01-06 LAB — CK: Total CK: 246 U/L (ref 49–397)

## 2024-01-06 LAB — CREATININE, SERUM
Creatinine, Ser: 0.87 mg/dL (ref 0.61–1.24)
GFR, Estimated: >60 mL/min (ref >60)

## 2024-01-06 LAB — HIV ANTIBODY (ROUTINE TESTING W REFLEX): HIV Screen 4th Generation wRfx: NONREACTIVE

## 2024-01-06 LAB — C-REACTIVE PROTEIN: CRP: 6.3 mg/dL — ABNORMAL HIGH (ref <1.0)

## 2024-01-06 LAB — SEDIMENTATION RATE: Sed Rate: 24 mm/h — ABNORMAL HIGH (ref 0–16)

## 2024-01-06 LAB — I-STAT CG4 LACTIC ACID, ED: Lactic Acid, Venous: 0.9 mmol/L (ref 0.5–1.9)

## 2024-01-06 MED ORDER — HYDROMORPHONE HCL 2 MG PO TABS
2.0000 mg | ORAL_TABLET | ORAL | Status: DC | PRN
  Administered 2024-01-07: 2 mg via ORAL
  Filled 2024-01-06: qty 1

## 2024-01-06 MED ORDER — LORAZEPAM 1 MG PO TABS
0.0000 mg | ORAL_TABLET | Freq: Four times a day (QID) | ORAL | Status: DC
  Administered 2024-01-07 (×4): 1 mg via ORAL
  Administered 2024-01-08: 2 mg via ORAL
  Filled 2024-01-06 (×4): qty 1
  Filled 2024-01-06: qty 2
  Filled 2024-01-06: qty 1

## 2024-01-06 MED ORDER — METHOCARBAMOL 500 MG PO TABS
500.0000 mg | ORAL_TABLET | Freq: Three times a day (TID) | ORAL | Status: DC
  Administered 2024-01-06 – 2024-01-08 (×5): 500 mg via ORAL
  Filled 2024-01-06 (×6): qty 1

## 2024-01-06 MED ORDER — THIAMINE MONONITRATE 100 MG PO TABS
100.0000 mg | ORAL_TABLET | Freq: Every day | ORAL | Status: DC
  Administered 2024-01-06 – 2024-01-08 (×3): 100 mg via ORAL
  Filled 2024-01-06 (×3): qty 1

## 2024-01-06 MED ORDER — HYDROMORPHONE HCL 1 MG/ML IJ SOLN
1.0000 mg | INTRAMUSCULAR | Status: DC | PRN
  Administered 2024-01-06 – 2024-01-07 (×2): 1 mg via INTRAVENOUS
  Filled 2024-01-06 (×2): qty 1

## 2024-01-06 MED ORDER — LINEZOLID 600 MG/300ML IV SOLN
600.0000 mg | Freq: Two times a day (BID) | INTRAVENOUS | Status: DC
  Administered 2024-01-07 – 2024-01-08 (×3): 600 mg via INTRAVENOUS
  Filled 2024-01-06 (×3): qty 300

## 2024-01-06 MED ORDER — LORAZEPAM 2 MG/ML IJ SOLN
1.0000 mg | INTRAMUSCULAR | Status: DC | PRN
  Administered 2024-01-06: 3 mg via INTRAVENOUS
  Filled 2024-01-06: qty 2

## 2024-01-06 MED ORDER — LINEZOLID 600 MG/300ML IV SOLN: 600.0000 mg | Freq: Two times a day (BID) | INTRAVENOUS | Status: DC

## 2024-01-06 MED ORDER — HYDROMORPHONE HCL 1 MG/ML IJ SOLN
1.0000 mg | INTRAMUSCULAR | Status: DC | PRN
  Administered 2024-01-06: 1 mg via INTRAVENOUS
  Filled 2024-01-06: qty 1

## 2024-01-06 MED ORDER — PIPERACILLIN-TAZOBACTAM 3.375 G IVPB 30 MIN
3.3750 g | Freq: Once | INTRAVENOUS | Status: AC
  Administered 2024-01-06: 3.375 g via INTRAVENOUS
  Filled 2024-01-06: qty 50

## 2024-01-06 MED ORDER — LACTATED RINGERS IV BOLUS
1000.0000 mL | Freq: Once | INTRAVENOUS | Status: AC
  Administered 2024-01-06: 1000 mL via INTRAVENOUS

## 2024-01-06 MED ORDER — VANCOMYCIN HCL 2000 MG/400ML IV SOLN
2000.0000 mg | Freq: Once | INTRAVENOUS | Status: AC
  Administered 2024-01-06: 2000 mg via INTRAVENOUS
  Filled 2024-01-06: qty 400

## 2024-01-06 MED ORDER — KETOROLAC TROMETHAMINE 15 MG/ML IJ SOLN
15.0000 mg | Freq: Once | INTRAMUSCULAR | Status: AC
  Administered 2024-01-06: 15 mg via INTRAVENOUS
  Filled 2024-01-06: qty 1

## 2024-01-06 MED ORDER — LORAZEPAM 1 MG PO TABS
1.0000 mg | ORAL_TABLET | ORAL | Status: DC | PRN
  Administered 2024-01-07 – 2024-01-08 (×6): 1 mg via ORAL
  Filled 2024-01-06 (×5): qty 1

## 2024-01-06 MED ORDER — CLONIDINE HCL 0.1 MG PO TABS
0.1000 mg | ORAL_TABLET | Freq: Four times a day (QID) | ORAL | Status: DC | PRN
  Administered 2024-01-07 – 2024-01-08 (×2): 0.1 mg via ORAL
  Filled 2024-01-06 (×2): qty 1

## 2024-01-06 MED ORDER — ENOXAPARIN SODIUM 40 MG/0.4ML IJ SOSY
40.0000 mg | PREFILLED_SYRINGE | INTRAMUSCULAR | Status: DC
  Administered 2024-01-06: 40 mg via SUBCUTANEOUS
  Filled 2024-01-06: qty 0.4

## 2024-01-06 MED ORDER — IOHEXOL 350 MG/ML SOLN
75.0000 mL | Freq: Once | INTRAVENOUS | Status: AC | PRN
  Administered 2024-01-06: 75 mL via INTRAVENOUS

## 2024-01-06 MED ORDER — FOLIC ACID 1 MG PO TABS
1.0000 mg | ORAL_TABLET | Freq: Every day | ORAL | Status: DC
  Administered 2024-01-06 – 2024-01-08 (×3): 1 mg via ORAL
  Filled 2024-01-06 (×3): qty 1

## 2024-01-06 MED ORDER — LORAZEPAM 2 MG/ML IJ SOLN
1.0000 mg | Freq: Once | INTRAMUSCULAR | Status: AC
  Administered 2024-01-06: 1 mg via INTRAVENOUS
  Filled 2024-01-06: qty 1

## 2024-01-06 MED ORDER — THIAMINE HCL 100 MG/ML IJ SOLN: 100.0000 mg | Freq: Every day | INTRAMUSCULAR | Status: DC

## 2024-01-06 MED ORDER — ACETAMINOPHEN 500 MG PO TABS
1000.0000 mg | ORAL_TABLET | Freq: Three times a day (TID) | ORAL | Status: DC
  Administered 2024-01-07 – 2024-01-08 (×5): 1000 mg via ORAL
  Filled 2024-01-06 (×7): qty 2

## 2024-01-06 MED ORDER — FENTANYL CITRATE (PF) 50 MCG/ML IJ SOSY
75.0000 ug | PREFILLED_SYRINGE | Freq: Once | INTRAMUSCULAR | Status: AC
  Administered 2024-01-06: 75 ug via INTRAVENOUS
  Filled 2024-01-06: qty 2

## 2024-01-06 MED ORDER — LORAZEPAM 1 MG PO TABS: 0.0000 mg | ORAL_TABLET | Freq: Two times a day (BID) | ORAL | Status: DC

## 2024-01-06 MED ORDER — GABAPENTIN 100 MG PO CAPS
300.0000 mg | ORAL_CAPSULE | Freq: Three times a day (TID) | ORAL | Status: DC
  Administered 2024-01-06 – 2024-01-08 (×5): 300 mg via ORAL
  Filled 2024-01-06 (×6): qty 3

## 2024-01-06 MED ORDER — ADULT MULTIVITAMIN W/MINERALS CH
1.0000 | ORAL_TABLET | Freq: Every day | ORAL | Status: DC
  Administered 2024-01-06 – 2024-01-08 (×3): 1 via ORAL
  Filled 2024-01-06 (×3): qty 1

## 2024-01-06 MED ORDER — BUPRENORPHINE HCL-NALOXONE HCL 8-2 MG SL SUBL
1.0000 | SUBLINGUAL_TABLET | Freq: Every day | SUBLINGUAL | Status: DC
  Filled 2024-01-06: qty 1

## 2024-01-06 NOTE — ED Notes (Signed)
 IV team at bedside

## 2024-01-06 NOTE — Progress Notes (Signed)
 Contacted earlier regarding increased pain, agitation and withdrawal symptoms.  CIWA at that time was 27.  Patient also had significant hypertension.  Chart review demonstrated history of prior methamphetamine and heroin abuse although patient denied using heroin prior to admission.  He purportedly was taking Suboxone  prior to admission.  Nursing states he had refused Suboxone  since arrival but I do not see that Suboxone  had been ordered.  He also has as needed benzodiazepines to use at home.  Urine drug screen was positive for amphetamines and benzodiazepines.  As a precaution I have written to follow CIWA every 4-6 hours and have initiated MedSurg Ativan  CIWA withdrawal protocol in the event patient is withdrawing from benzodiazepines.  At the time of my interventions the patient had gone to sleep finally after a dose of Ativan  but I asked the nurse to question him further regarding Suboxone  use or other use of narcotics prior to arrival.  In the interim I have ordered p.o. Dilaudid  2 mg every 4 hours as needed for pain and have changed the IV Dilaudid  to as needed use for breakthrough pain not controlled by oral Dilaudid .  Follow-up CIWA score pending.

## 2024-01-06 NOTE — ED Provider Triage Note (Signed)
 Emergency Medicine Provider Triage Evaluation Note  [name] , a 53 y.o. male  was evaluated in triage.  Pt complains of pain to his left forearm with worsening swelling x 3 days.  Struck hand/wrist while opening a pesticide can.  Noted that nail punctured the skin.  Since then he has had worsening pain and swelling.  Review of Systems  Positive: Left upper extremity pain Negative: No fevers  Physical Exam  BP (!) 148/112 (BP Location: Right Arm)   Pulse 92   Temp 98.8 F (37.1 C) (Oral)   Resp (!) 22   Ht 5' 8 (1.727 m)   Wt 95.3 kg   SpO2 100%   BMI 31.93 kg/m  Gen:   Awake, appears uncomfortable Resp:  Normal effort  MSK:   Left wrist and forearm with swelling.  Some superficial abrasions overlying wrist with some minor surrounding erythema.  Tender to palpation.  Palpable pulses.  Able to move all of his fingers, but decreased ROM secondary pain Other:  Alert and oriented x 3.  Medical Decision Making  Medically screening exam initiated at 9:24 AM.  Appropriate orders placed.  [name] was informed that the remainder of the evaluation will be completed by another provider, this initial triage assessment does not replace that evaluation, and the importance of remaining in the ED until their evaluation is complete.  53 year old with left wrist and forearm pain.  Screening labs and x-ray ordered.   Neysa Caron PARAS, DO 01/06/24 [phone]

## 2024-01-06 NOTE — Plan of Care (Signed)
   Problem: Coping: Goal: Level of anxiety will decrease Outcome: Progressing   Problem: Pain Managment: Goal: General experience of comfort will improve and/or be controlled Outcome: Progressing

## 2024-01-06 NOTE — Progress Notes (Signed)
 [name] was secured chatted because is agitated & confused. He's showing withdrawal symptoms.

## 2024-01-06 NOTE — Progress Notes (Signed)
 ED Pharmacy Antibiotic Sign Off An antibiotic consult was received from an ED provider for zosyn  and vancomycin  per pharmacy dosing for cellulitis. A chart review was completed to assess appropriateness.   The following one time order(s) were placed:  Zosyn  3.375g Vancomycin  2g  Further antibiotic and/or antibiotic pharmacy consults should be ordered by the admitting provider if indicated.   Thank you for allowing pharmacy to be a part of this patient's care.   Leonor GORMAN Bash, [facility]  Clinical Pharmacist 01/06/24 11:25 AM

## 2024-01-06 NOTE — Progress Notes (Signed)
 Secured chat the MD multiple times, no answer, notified charged nurse, charge nurse paged MD, awaiting for orders.

## 2024-01-06 NOTE — ED Provider Notes (Signed)
 [facility]ERGENC[facility] DEPARTMENT AT [facility] Provider Note   CSN: [id number] Arrival date & time: 01/06/24  9089     Patient presents with: No chief complaint on file.   [name] is a 53 y.o. male who presents emergency department for evaluation of left arm pain and swelling.  He has an extensive past medical history that includes IV drug use, MRSA bacteremia reported history of necrotizing fasciitis which he states is because he cleans quite ponds for living and got a bad infection from the water.  He denies active IV drug use.  He reports that he stuck his hand with a nail to her 3 days ago started having pain and swelling in that yesterday tried to sleep it off but woke up with severe intractable pain.   HPI     Prior to Admission medications   Medication Sig Start Date End Date Taking? Authorizing Provider  ALPRAZolam  (XANAX ) 0.5 MG tablet Take 1 tablet (0.5 mg total) by mouth 2 (two) times daily as needed for anxiety. 03/25/23   Wendee Lynwood HERO, NP  alprazolam  (XANAX ) 2 MG tablet TAKE 1 TABLET BY MOUTH 3 TIMES DAILY AS NEEDED FOR ANXIETY 12/21/15   Weber, Lauraine CROME, PA-C  amLODipine  (NORVASC ) 5 MG tablet Take 1 tablet (5 mg total) by mouth daily. 01/23/22   Wendee Lynwood HERO, NP  amphetamine -dextroamphetamine  (ADDERALL) 20 MG tablet Take 20 mg by mouth 3 (three) times daily. 01/31/14   [provider]  buprenorphine -naloxone  (SUBOXONE ) 8-2 mg SUBL SL tablet Place 1 tablet under the tongue 3 (three) times daily. 03/29/23   [provider]  citalopram  (CELEXA ) 40 MG tablet Take by mouth. 03/11/18   [provider]  naloxone  (NARCAN ) nasal spray 4 mg/0.1 mL Administer in case of opiate overdose 08/10/21   Long, Joshua G, MD    Allergies: Codeine and Tylenol  [acetaminophen ]    Review of Systems  Updated Vital Signs BP (!) 158/108   Pulse 81   Temp 98.8 F (37.1 C) (Oral)   Resp (!) 22   Ht 5' 8 (1.727 m)   Wt 95.3 kg   SpO2 100%    BMI 31.93 kg/m   Physical Exam Vitals and nursing note reviewed.  Constitutional:      General: He is not in acute distress.    Appearance: He is well-developed. He is not diaphoretic.     Comments: Patient's somnolent difficult to arouse  HENT:     Head: Normocephalic and atraumatic.  Eyes:     General: No scleral icterus.    Conjunctiva/sclera: Conjunctivae normal.  Cardiovascular:     Rate and Rhythm: Normal rate and regular rhythm.     Heart sounds: Normal heart sounds.  Pulmonary:     Effort: Pulmonary effort is normal. No respiratory distress.     Breath sounds: Normal breath sounds.  Abdominal:     Palpations: Abdomen is soft.     Tenderness: There is no abdominal tenderness.  Musculoskeletal:     Cervical back: Normal range of motion and neck supple.     Comments: Left arm with erythema, extensive circumferential edema and swelling with erythema centralized int he hand and distal forearm. Able to wiggle fingers with pain .  No palpable crepitus   Skin:    General: Skin is warm and dry.  Neurological:     Mental Status: He is alert.  Psychiatric:        Behavior: Behavior normal.     (  all labs ordered are listed, but only abnormal results are displayed) Labs Reviewed  BASIC METABOLIC PANEL WITH GFR - Abnormal; Notable for the following components:      Result Value   Glucose, Bld 116 (*)    Calcium 8.6 (*)    All other components within normal limits  CBC  CK  RAPID URINE DRUG SCREEN, HOSP PERFORMED  URINALYSIS, ROUTINE W REFLEX MICROSCOPIC  I-STAT CG4 LACTIC ACID, ED  I-STAT CG4 LACTIC ACID, ED    EKG: None  Radiology: DG Forearm Left Result Date: 01/06/2024 CLINICAL DATA:  Left forearm pain and swelling. EXAM: LEFT FOREARM - 2 VIEW COMPARISON:  None Available. FINDINGS: There is no evidence of fracture or other focal bone lesions. Diffuse soft tissue swelling is seen. No evidence of soft tissue gas or radiopaque foreign body. IMPRESSION: Diffuse soft  tissue swelling. No osseous abnormality. Electronically Signed   By: Norleen DELENA Kil M.D.   On: 01/06/2024 10:58     Procedures   Medications Ordered in the ED  fentaNYL  (SUBLIMAZE ) injection 75 mcg (75 mcg Intravenous Given 01/06/24 1038)    Clinical Course as of 01/06/24 1108  Tue Jan 06, 2024  1107 WBC: 7.8 [AH]    Clinical Course User Index [AH] Arloa Chroman, PA-C                                 Medical Decision Making This is a 53 year old gentleman with a past medical history of IV drug use, reported history of necrotizing fasciitis, and rapid progression of pain and swelling after puncture wound to the hand over the last 24 hours.  Torrential diagnosis includes cellulitis, subcutaneous abscess, necrotizing fasciitis, osteomyelitis less likely Have extremely low suspicion for underlying DVT.  His physical exam findings are consistent with acute cellulitis. Given his history and rapid progression of pain and swelling I think that overnight observation for serial physical exams is warranted.   Amount and/or Complexity of Data Reviewed Labs: ordered. Decision-making details documented in ED Course.    Details: No elevated white blood cell count, slight mildly elevated blood glucose of 160 CK within normal limits Radiology: ordered.    Details: CT forearm wrist and hand no evidence of acute fluid collection or subcutaneous gas  Risk Prescription drug management. Decision regarding hospitalization.         Final diagnoses:  None    ED Discharge Orders     None          Arloa Chroman, PA-C 01/06/24 1805    Jerrol Agent, MD 01/06/24 1818

## 2024-01-06 NOTE — H&P (Signed)
 History and Physical    Patient: [name] FMW:[id number] DOB: 1970/06/17 DOA: 01/06/2024 DOS: the patient was seen and examined on 01/06/2024 PCP: Wendee Lynwood HERO, NP  Patient coming from: Home  Chief Complaint: No chief complaint on file.  HPI: [name] is a 53 y.o. male with medical history significant of prior IVDU (pt denies active use), HTN, depression, and last admitted to [facility] in 09/2020 for back pain c/f osteomyelitis initially but w/u neg and abx discontinued per ID (of note, UDS positive for amphetamines/benzodiazepines at that time despite pt denial) who p/w L arm swelling c/w cellulitis on imaging.  Pt is very somnolent and provides a limited history. From what I can gather, pt reports that he backhanded a nail covered with pesticide on Sunday and noticed swelling of his L hand extending to his L arm at that time; he presented to the ED when the swelling failed to improve.  In the ED, pt hypertensive. Labs unremarkable. CT hand/forearm/wrist showed Subcutaneous edema in the dorsal aspect of the distal forearm, wrist and hand, nonspecific, but potentially reflecting cellulitis. No organized fluid collection, foreign body or soft tissue emphysema demonstrated. EDP started IV abx and noted pt to be very somnolent raising c/f IVDU and requested admission for close observation overnight.   Review of Systems: As mentioned in the history of present illness. All other systems reviewed and are negative. Past Medical History:  Diagnosis Date   Anxiety    Chronic back pain    Depression    History of diverticulosis    History of kidney stones    Hypertension    Kidney stones    MRSA (methicillin resistant staph aureus) culture positive    Substance abuse (HCC)    former abuser methodone and oxycodone    Past Surgical History:  Procedure Laterality Date   CHOLECYSTECTOMY     I&D of right arm abscess  11/14/2003   kidney stones     Social History:  reports  that he has been smoking cigarettes. He has a 7.5 pack-year smoking history. He has never used smokeless tobacco. He reports that he does not currently use alcohol. He reports that he does not currently use drugs.  Allergies  Allergen Reactions   Codeine Itching and Nausea Only   Tylenol  [Acetaminophen ] Other (See Comments)    headache    Family History  Problem Relation Age of Onset   Coronary artery disease Mother        pacemaker   Diabetes Mother    Hypertension Mother    Diabetes Father    Hypertension Father    Hyperlipidemia Father    Coronary artery disease Father        had pacemaker   Cancer Maternal Grandmother        ovarian   Diabetes Paternal Grandfather     Prior to Admission medications   Medication Sig Start Date End Date Taking? Authorizing Provider  ALPRAZolam  (XANAX ) 0.5 MG tablet Take 1 tablet (0.5 mg total) by mouth 2 (two) times daily as needed for anxiety. 03/25/23   Wendee Lynwood HERO, NP  alprazolam  (XANAX ) 2 MG tablet TAKE 1 TABLET BY MOUTH 3 TIMES DAILY AS NEEDED FOR ANXIETY 12/21/15   Weber, Lauraine CROME, PA-C  amLODipine  (NORVASC ) 5 MG tablet Take 1 tablet (5 mg total) by mouth daily. 01/23/22   Wendee Lynwood HERO, NP  amphetamine -dextroamphetamine  (ADDERALL) 20 MG tablet Take 20 mg by mouth 3 (three) times daily. 01/31/14  [provider]  buprenorphine -naloxone  (SUBOXONE ) 8-2 mg SUBL SL tablet Place 1 tablet under the tongue 3 (three) times daily. 03/29/23   [provider]  citalopram  (CELEXA ) 40 MG tablet Take by mouth. 03/11/18   [provider]  naloxone  (NARCAN ) nasal spray 4 mg/0.1 mL Administer in case of opiate overdose 08/10/21   Darra Fonda MATSU, MD    Physical Exam: Vitals:   01/06/24 0914 01/06/24 1040 01/06/24 1115 01/06/24 1300  BP: (!) 148/112 (!) 158/108 (!) 179/108 (!) 189/119  Pulse: 92 81 85 88  Resp: (!) 22     Temp: 98.8 F (37.1 C)     TempSrc: Oral     SpO2: 100% 100% 99% 99%  Weight:      Height:        General: Somnolent, awakens to voice; oriented x3, resting comfortably in no acute distress Respiratory: Lungs clear to auscultation bilaterally with normal respiratory effort; no w/r/r Cardiovascular: Regular rate and rhythm w/o m/r/g Abdomen: Soft, nontender, nondistended. Positive bowel sounds MSK: LUE swelling and erythema   Data Reviewed: {Tip this will not be part of the note when signed- Document your independent interpretation of telemetry tracing, EKG, lab, Radiology test or any other diagnostic tests. Add any new diagnostic test ordered today. (Optional):[id number]} Lab Results  Component Value Date   WBC 7.8 01/06/2024   HGB 14.2 01/06/2024   HCT 41.4 01/06/2024   MCV 88.5 01/06/2024   PLT 217 01/06/2024   Lab Results  Component Value Date   GLUCOSE 116 (H) 01/06/2024   CALCIUM 8.6 (L) 01/06/2024   NA 137 01/06/2024   K 3.7 01/06/2024   CO2 22 01/06/2024   CL 105 01/06/2024   BUN 17 01/06/2024   CREATININE 0.85 01/06/2024   Lab Results  Component Value Date   ALT 14 03/25/2023   AST 15 03/25/2023   ALKPHOS 81 03/25/2023   BILITOT 0.6 03/25/2023   No results found for: INR, PROTIME Radiology: CT HAND LEFT W CONTRAST Result Date: 01/06/2024 CLINICAL DATA:  Soft tissue mass, wrist, deep; Soft tissue infection suspected, hand, no prior imaging Increased pain and swelling in the left wrist and hand after penetrating injury 3 days ago. EXAM: CT OF THE UPPER LEFT EXTREMITY WITH CONTRAST TECHNIQUE: Multidetector CT imaging of the left wrist and left hand was performed according to the standard protocol following intravenous contrast administration. RADIATION DOSE REDUCTION: This exam was performed according to the departmental dose-optimization program which includes automated exposure control, adjustment of the mA and/or kV according to patient size and/or use of iterative reconstruction technique. CONTRAST:  75mL OMNIPAQUE  IOHEXOL  350 MG/ML SOLN COMPARISON:  None  Available. FINDINGS: Bones/Joint/Cartilage Forearm findings dictated separately. Examination was performed with the left arm by the patient's side. Resulting significant beam hardening artifact, limiting detail in the wrist and hand. No evidence of acute fracture, dislocation or bone destruction. The joint spaces appear preserved, and no significant joint effusions are identified. Ligaments Suboptimally assessed by CT. Muscles and Tendons No focal muscular abnormalities are identified. The wrist and hand tendons appear intact without obvious tenosynovitis; evaluation limited by beam hardening artifact. Soft tissues Subcutaneous edema in the dorsal aspect of the distal forearm, wrist and hand. No organized fluid collection, foreign body or soft tissue emphysema demonstrated. IMPRESSION: 1. Subcutaneous edema in the dorsal aspect of the distal forearm, wrist and hand, nonspecific, but potentially reflecting cellulitis. No organized fluid collection, foreign body or soft tissue emphysema demonstrated. 2. No acute osseous findings or  significant arthropathic changes. 3. Detail in the wrist and hand is limited by beam hardening artifact due to overlap with the patient's trunk. If concern for acute osseous abnormality, recommend plain film correlation. 4. Forearm findings dictated separately. Electronically Signed   By: Elsie Perone M.D.   On: 01/06/2024 12:06   CT WRIST LEFT W CONTRAST Result Date: 01/06/2024 CLINICAL DATA:  Soft tissue mass, wrist, deep; Soft tissue infection suspected, hand, no prior imaging Increased pain and swelling in the left wrist and hand after penetrating injury 3 days ago. EXAM: CT OF THE UPPER LEFT EXTREMITY WITH CONTRAST TECHNIQUE: Multidetector CT imaging of the left wrist and left hand was performed according to the standard protocol following intravenous contrast administration. RADIATION DOSE REDUCTION: This exam was performed according to the departmental dose-optimization  program which includes automated exposure control, adjustment of the mA and/or kV according to patient size and/or use of iterative reconstruction technique. CONTRAST:  75mL OMNIPAQUE  IOHEXOL  350 MG/ML SOLN COMPARISON:  None Available. FINDINGS: Bones/Joint/Cartilage Forearm findings dictated separately. Examination was performed with the left arm by the patient's side. Resulting significant beam hardening artifact, limiting detail in the wrist and hand. No evidence of acute fracture, dislocation or bone destruction. The joint spaces appear preserved, and no significant joint effusions are identified. Ligaments Suboptimally assessed by CT. Muscles and Tendons No focal muscular abnormalities are identified. The wrist and hand tendons appear intact without obvious tenosynovitis; evaluation limited by beam hardening artifact. Soft tissues Subcutaneous edema in the dorsal aspect of the distal forearm, wrist and hand. No organized fluid collection, foreign body or soft tissue emphysema demonstrated. IMPRESSION: 1. Subcutaneous edema in the dorsal aspect of the distal forearm, wrist and hand, nonspecific, but potentially reflecting cellulitis. No organized fluid collection, foreign body or soft tissue emphysema demonstrated. 2. No acute osseous findings or significant arthropathic changes. 3. Detail in the wrist and hand is limited by beam hardening artifact due to overlap with the patient's trunk. If concern for acute osseous abnormality, recommend plain film correlation. 4. Forearm findings dictated separately. Electronically Signed   By: Elsie Perone M.D.   On: 01/06/2024 12:06   CT FOREARM LEFT W CONTRAST Result Date: 01/06/2024 CLINICAL DATA:  Soft tissue mass, forearm, deep Worsening left forearm pain and swelling after penetrating injury 3 days ago. EXAM: CT OF THE UPPER LEFT EXTREMITY WITH CONTRAST TECHNIQUE: Multidetector CT imaging of the left forearm was performed according to the standard protocol  following intravenous contrast administration. RADIATION DOSE REDUCTION: This exam was performed according to the departmental dose-optimization program which includes automated exposure control, adjustment of the mA and/or kV according to patient size and/or use of iterative reconstruction technique. CONTRAST:  75mL OMNIPAQUE  IOHEXOL  350 MG/ML SOLN COMPARISON:  Same-day radiographs FINDINGS: Bones/Joint/Cartilage Wrist and hand findings are dictated separately. No evidence of acute fracture, dislocation or osteomyelitis within the forearm. No significant arthropathy or effusion at the elbow. Ligaments Suboptimally assessed by CT. Muscles and Tendons No focal muscular abnormalities are identified. Specifically, no suspicious enhancement or focal fluid collection. Soft tissues A specific wound is not marked or otherwise indicated for this examination. Mild subcutaneous edema noted, greatest in the dorsal and ulnar aspects of the distal forearm. No focal fluid collection, foreign body or soft tissue emphysema identified. IMPRESSION: 1. Mild subcutaneous edema in the distal forearm, greatest in the dorsal and ulnar aspects of the distal forearm. No focal fluid collection, foreign body or soft tissue emphysema identified. 2. No evidence of osteomyelitis or acute  osseous findings. 3. Wrist and hand findings are dictated separately. Electronically Signed   By: Elsie Perone M.D.   On: 01/06/2024 12:00   DG Forearm Left Result Date: 01/06/2024 CLINICAL DATA:  Left forearm pain and swelling. EXAM: LEFT FOREARM - 2 VIEW COMPARISON:  None Available. FINDINGS: There is no evidence of fracture or other focal bone lesions. Diffuse soft tissue swelling is seen. No evidence of soft tissue gas or radiopaque foreign body. IMPRESSION: Diffuse soft tissue swelling. No osseous abnormality. Electronically Signed   By: Norleen DELENA Kil M.D.   On: 01/06/2024 10:58    Assessment and Plan: 45M h/o prior IVDU (pt denies active use),  HTN, depression, and last admitted to [facility] in 09/2020 for back pain c/f osteomyelitis initially but w/u neg and abx discontinued per ID (of note, UDS positive for amphetamines/benzos at that time despite pt denial) who p/w L arm swelling c/w cellulitis on imaging.  L arm cellulitis -IV linezolid  600mg  BID for now -F/u blood cultures; if positive, will need TTE to eval for IE -F/u ESR/CRP; if significantly elevated consider MRI L arm to eval for osteomyeltitis  Somnolence Possible related to infection, but will need to exclude alternative etioogies such as IVDU given hx -F/u UDS  H/o IVDU -PTA Suboxone  8-2mg  daily    Advance Care Planning:   Code Status: Full Code   Consults: N/A  Family Communication: N/A  Severity of Illness: The appropriate patient status for this patient is OBSERVATION. Observation status is judged to be reasonable and necessary in order to provide the required intensity of service to ensure the patient's safety. The patient's presenting symptoms, physical exam findings, and initial radiographic and laboratory data in the context of their medical condition is felt to place them at decreased risk for further clinical deterioration. Furthermore, it is anticipated that the patient will be medically stable for discharge from the hospital within 2 midnights of admission.    ------- I spent 55 minutes reviewing previous notes, at the bedside counseling/discussing the treatment plan, and performing clinical documentation.  Author: Marsha Ada, MD 01/06/2024 2:05 PM  For on call review [email or web address].

## 2024-01-06 NOTE — ED Notes (Signed)
 Picked up by [facility] for transport to [facility]

## 2024-01-06 NOTE — Progress Notes (Signed)
 [facility]  EXPEDITER LEVEL LOADING ASSESSMENT NOTE  Patient Name: [name]  DOB:June 05, 1970 Date of Admission: 01/06/2024  Date of Assessment:01/06/24   -------------------------------------------------------------------------------------------------------------------   Brief clinical summary: 53 yo male cellulitis to left hand, reviewed labs, notes.  Is there Bed Availability at another [facility]? Yes  If yes, what facility: [facility]  Level of Care Needed:  Yes  MD Agree to transfer: Yes  Patient agrees to transfer: Yes    -------------------------------------------------------------------------------------------------------------------  [facility] RN Expediter, Sharolyn JONETTA Batman Please contact us  directly via secure chat (search for [facility]) or by calling us  at [phone] [facility]).

## 2024-01-06 NOTE — Progress Notes (Signed)
 Orders recieved, patient received ativan  at 1836, report given to Nurse Kathrine

## 2024-01-06 NOTE — Progress Notes (Signed)
 Patient refusing vitals

## 2024-01-06 NOTE — ED Triage Notes (Signed)
 Patient arrives via [facility] for left hand pain. Stuck hand 3 days ago with a nail that had pesticide on it. Up to date on tetanus. Hypertensive with EMS 160 palp. GCS 15.

## 2024-01-07 DIAGNOSIS — L03114 Cellulitis of left upper limb: Secondary | ICD-10-CM

## 2024-01-07 MED ORDER — OXYCODONE HCL 5 MG PO TABS
10.0000 mg | ORAL_TABLET | ORAL | Status: DC | PRN
  Administered 2024-01-07 – 2024-01-08 (×5): 10 mg via ORAL
  Filled 2024-01-07 (×5): qty 2

## 2024-01-07 MED ORDER — HYDROMORPHONE HCL 1 MG/ML IJ SOLN
1.0000 mg | INTRAMUSCULAR | Status: DC | PRN
  Administered 2024-01-07 (×2): 1 mg via INTRAVENOUS
  Filled 2024-01-07 (×2): qty 1

## 2024-01-07 MED ORDER — HYDRALAZINE HCL 50 MG PO TABS
50.0000 mg | ORAL_TABLET | Freq: Four times a day (QID) | ORAL | Status: DC | PRN
Start: 2024-01-07 — End: 2024-01-08
  Administered 2024-01-07 – 2024-01-08 (×2): 50 mg via ORAL
  Filled 2024-01-07 (×2): qty 1

## 2024-01-07 NOTE — Progress Notes (Signed)
 PROGRESS NOTE    GILLIE CRISCI  FMW:[id number] DOB: 1970/02/14 DOA: 01/06/2024 PCP: Wendee Lynwood HERO, NP    Brief Narrative:  53 year old with history of IV drug use, patient denies active use however positive for amphetamines and benzos, hypertension, depression came to the emergency room with left hand swelling for 2 days.  Patient reports backhanded on nail covered with pesticide before starting the swelling. In the emergency room hypertensive.  WBC count normal.  CT scan of the hand and forearm and wrist shows subcutaneous edema in the dorsal aspect of distal forearm, no organized abscess or foreign body.  Due to significant symptoms started on IV antibiotics and admitted to the hospital.  Subjective: Patient seen and examined.  Overnight he had remained agitated, needed a lot of pain medications and Ativan .  On my interview, he is sleepy, wakes up and describes severe pain on his hands.  Difficult to keep awake.  Tremulous.  Still denies taking any drugs or alcohol.  Assessment & Plan:   Left arm cellulitis: Currently does not have any evidence of abscess or drainable collection. Continue IV Zyvox .  Blood cultures negative so far. Elevate arm.  Continue to mobilize.  Adequate pain medications.  Will monitor clinically.  Alcohol withdrawal syndrome/unknown drug withdrawal: Patient shows tremulousness, tachycardia and hypertension with high CIWA score. UDS positive for amphetamine  and benzos. Alcohol level was not measured on arrival.  Denies use. Symptomatic management.  Pain medications.  Delirium precautions.  Fall precautions. Benzo based CIWA protocol.  Multivitamins.  Hypertension: Currently on as needed medications.  DVT prophylaxis: enoxaparin  (LOVENOX ) injection 40 mg Start: 01/06/24 1600   Code Status: Full code Family Communication: None at the bedside Disposition Plan: Status is: Inpatient Remains inpatient appropriate because: IV antibiotics      Consultants:  None  Procedures:  None  Antimicrobials:  Zyvox  11/25---     Objective: Vitals:   01/06/24 2228 01/07/24 0330 01/07/24 0917 01/07/24 1103  BP: (!) 176/103 (!) 127/104 (!) 177/152 (!) 179/101  Pulse: (!) 103 90 84 92  Resp: 18 19 16 16   Temp: (!) 100.4 F (38 C) 100 F (37.8 C) 98.1 F (36.7 C) 98.2 F (36.8 C)  TempSrc: Oral Oral Oral Oral  SpO2: 100% 97% 93% 92%  Weight:      Height:        Intake/Output Summary (Last 24 hours) at 01/07/2024 1123 Last data filed at 01/07/2024 0900 Gross per 24 hour  Intake 1684.26 ml  Output --  Net 1684.26 ml   Filed Weights   01/06/24 0912  Weight: 95.3 kg    Examination:  General exam: Sick looking, tremulous and anxious on his stimulation. Respiratory system: Clear to auscultation. Respiratory effort normal.  On room air. Cardiovascular system: S1 & S2 heard, RRR. No JVD, murmurs, rubs, gallops or clicks. No pedal edema. Gastrointestinal system: Abdomen is nondistended, soft and nontender. No organomegaly or masses felt. Normal bowel sounds heard. Central nervous system: Alert and awake.  Mostly oriented.  Falls back asleep.  Difficult to keep awake on interview.  Moves all extremities equally. Extremities: Symmetric 5 x 5 power. Skin:  Significant swelling of the dorsum of the wrist, dorsum of the hand, forearm without any crepitus, without any fluctuation.    Data Reviewed: I have personally reviewed following labs and imaging studies  CBC: Recent Labs  Lab 01/06/24 0920 01/06/24 1436  WBC 7.8 7.7  HGB 14.2 13.3  HCT 41.4 39.3  MCV 88.5 89.3  PLT 217 201   Basic Metabolic Panel: Recent Labs  Lab 01/06/24 0920 01/06/24 1436  NA 137  --   K 3.7  --   CL 105  --   CO2 22  --   GLUCOSE 116*  --   BUN 17  --   CREATININE 0.85 0.87  CALCIUM 8.6*  --    GFR: Estimated Creatinine Clearance: 112.5 mL/min (by C-G formula based on SCr of 0.87 mg/dL). Liver Function Tests: No results  for input(s): AST, ALT, ALKPHOS, BILITOT, PROT, ALBUMIN in the last 168 hours. No results for input(s): LIPASE, AMYLASE in the last 168 hours. No results for input(s): AMMONIA in the last 168 hours. Coagulation Profile: No results for input(s): INR, PROTIME in the last 168 hours. Cardiac Enzymes: Recent Labs  Lab 01/06/24 0920  CKTOTAL 246   BNP (last 3 results) No results for input(s): PROBNP in the last 8760 hours. HbA1C: No results for input(s): HGBA1C in the last 72 hours. CBG: No results for input(s): GLUCAP in the last 168 hours. Lipid Profile: No results for input(s): CHOL, HDL, LDLCALC, TRIG, CHOLHDL, LDLDIRECT in the last 72 hours. Thyroid  Function Tests: No results for input(s): TSH, T4TOTAL, FREET4, T3FREE, THYROIDAB in the last 72 hours. Anemia Panel: No results for input(s): VITAMINB12, FOLATE, FERRITIN, TIBC, IRON, RETICCTPCT in the last 72 hours. Sepsis Labs: Recent Labs  Lab 01/06/24 0951  LATICACIDVEN 0.9    Recent Results (from the past 240 hours)  Culture, blood (Routine X 2) w Reflex to ID Panel     Status: None (Preliminary result)   Collection Time: 01/06/24  2:36 PM   Specimen: BLOOD  Result Value Ref Range Status   Specimen Description BLOOD SITE NOT SPECIFIED  Final   Special Requests   Final    BOTTLES DRAWN AEROBIC AND ANAEROBIC Blood Culture results may not be optimal due to an inadequate volume of blood received in culture bottles   Culture   Final    NO GROWTH < 24 HOURS Performed at [facility], [address], [place], [place] [address]    Report Status PENDING  Incomplete  Culture, blood (Routine X 2) w Reflex to ID Panel     Status: None (Preliminary result)   Collection Time: 01/07/24  1:07 AM   Specimen: BLOOD RIGHT ARM  Result Value Ref Range Status   Specimen Description   Final    BLOOD RIGHT ARM Performed at [facility], [address], [place],  [place] [address]    Special Requests   Final    BOTTLES DRAWN AEROBIC AND ANAEROBIC Blood Culture results may not be optimal due to an inadequate volume of blood received in culture bottles Performed at [facility], [address], [place], [place] [address]    Culture   Final    NO GROWTH <12 HOURS Performed at [facility], [address], [place], [place] [address]    Report Status PENDING  Incomplete         Radiology Studies: CT HAND LEFT W CONTRAST Result Date: 01/06/2024 CLINICAL DATA:  Soft tissue mass, wrist, deep; Soft tissue infection suspected, hand, no prior imaging Increased pain and swelling in the left wrist and hand after penetrating injury 3 days ago. EXAM: CT OF THE UPPER LEFT EXTREMITY WITH CONTRAST TECHNIQUE: Multidetector CT imaging of the left wrist and left hand was performed according to the standard protocol following intravenous contrast administration. RADIATION DOSE REDUCTION:  This exam was performed according to the departmental dose-optimization program which includes automated exposure control, adjustment of the mA and/or kV according to patient size and/or use of iterative reconstruction technique. CONTRAST:  75mL OMNIPAQUE  IOHEXOL  350 MG/ML SOLN COMPARISON:  None Available. FINDINGS: Bones/Joint/Cartilage Forearm findings dictated separately. Examination was performed with the left arm by the patient's side. Resulting significant beam hardening artifact, limiting detail in the wrist and hand. No evidence of acute fracture, dislocation or bone destruction. The joint spaces appear preserved, and no significant joint effusions are identified. Ligaments Suboptimally assessed by CT. Muscles and Tendons No focal muscular abnormalities are identified. The wrist and hand tendons appear intact without obvious tenosynovitis; evaluation limited by beam hardening artifact. Soft tissues Subcutaneous edema in the dorsal aspect of the distal forearm, wrist and  hand. No organized fluid collection, foreign body or soft tissue emphysema demonstrated. IMPRESSION: 1. Subcutaneous edema in the dorsal aspect of the distal forearm, wrist and hand, nonspecific, but potentially reflecting cellulitis. No organized fluid collection, foreign body or soft tissue emphysema demonstrated. 2. No acute osseous findings or significant arthropathic changes. 3. Detail in the wrist and hand is limited by beam hardening artifact due to overlap with the patient's trunk. If concern for acute osseous abnormality, recommend plain film correlation. 4. Forearm findings dictated separately. Electronically Signed   By: Elsie Perone M.D.   On: 01/06/2024 12:06   CT WRIST LEFT W CONTRAST Result Date: 01/06/2024 CLINICAL DATA:  Soft tissue mass, wrist, deep; Soft tissue infection suspected, hand, no prior imaging Increased pain and swelling in the left wrist and hand after penetrating injury 3 days ago. EXAM: CT OF THE UPPER LEFT EXTREMITY WITH CONTRAST TECHNIQUE: Multidetector CT imaging of the left wrist and left hand was performed according to the standard protocol following intravenous contrast administration. RADIATION DOSE REDUCTION: This exam was performed according to the departmental dose-optimization program which includes automated exposure control, adjustment of the mA and/or kV according to patient size and/or use of iterative reconstruction technique. CONTRAST:  75mL OMNIPAQUE  IOHEXOL  350 MG/ML SOLN COMPARISON:  None Available. FINDINGS: Bones/Joint/Cartilage Forearm findings dictated separately. Examination was performed with the left arm by the patient's side. Resulting significant beam hardening artifact, limiting detail in the wrist and hand. No evidence of acute fracture, dislocation or bone destruction. The joint spaces appear preserved, and no significant joint effusions are identified. Ligaments Suboptimally assessed by CT. Muscles and Tendons No focal muscular abnormalities  are identified. The wrist and hand tendons appear intact without obvious tenosynovitis; evaluation limited by beam hardening artifact. Soft tissues Subcutaneous edema in the dorsal aspect of the distal forearm, wrist and hand. No organized fluid collection, foreign body or soft tissue emphysema demonstrated. IMPRESSION: 1. Subcutaneous edema in the dorsal aspect of the distal forearm, wrist and hand, nonspecific, but potentially reflecting cellulitis. No organized fluid collection, foreign body or soft tissue emphysema demonstrated. 2. No acute osseous findings or significant arthropathic changes. 3. Detail in the wrist and hand is limited by beam hardening artifact due to overlap with the patient's trunk. If concern for acute osseous abnormality, recommend plain film correlation. 4. Forearm findings dictated separately. Electronically Signed   By: Elsie Perone M.D.   On: 01/06/2024 12:06   CT FOREARM LEFT W CONTRAST Result Date: 01/06/2024 CLINICAL DATA:  Soft tissue mass, forearm, deep Worsening left forearm pain and swelling after penetrating injury 3 days ago. EXAM: CT OF THE UPPER LEFT EXTREMITY WITH CONTRAST TECHNIQUE: Multidetector CT imaging of the left  forearm was performed according to the standard protocol following intravenous contrast administration. RADIATION DOSE REDUCTION: This exam was performed according to the departmental dose-optimization program which includes automated exposure control, adjustment of the mA and/or kV according to patient size and/or use of iterative reconstruction technique. CONTRAST:  75mL OMNIPAQUE  IOHEXOL  350 MG/ML SOLN COMPARISON:  Same-day radiographs FINDINGS: Bones/Joint/Cartilage Wrist and hand findings are dictated separately. No evidence of acute fracture, dislocation or osteomyelitis within the forearm. No significant arthropathy or effusion at the elbow. Ligaments Suboptimally assessed by CT. Muscles and Tendons No focal muscular abnormalities are  identified. Specifically, no suspicious enhancement or focal fluid collection. Soft tissues A specific wound is not marked or otherwise indicated for this examination. Mild subcutaneous edema noted, greatest in the dorsal and ulnar aspects of the distal forearm. No focal fluid collection, foreign body or soft tissue emphysema identified. IMPRESSION: 1. Mild subcutaneous edema in the distal forearm, greatest in the dorsal and ulnar aspects of the distal forearm. No focal fluid collection, foreign body or soft tissue emphysema identified. 2. No evidence of osteomyelitis or acute osseous findings. 3. Wrist and hand findings are dictated separately. Electronically Signed   By: Elsie Perone M.D.   On: 01/06/2024 12:00   DG Forearm Left Result Date: 01/06/2024 CLINICAL DATA:  Left forearm pain and swelling. EXAM: LEFT FOREARM - 2 VIEW COMPARISON:  None Available. FINDINGS: There is no evidence of fracture or other focal bone lesions. Diffuse soft tissue swelling is seen. No evidence of soft tissue gas or radiopaque foreign body. IMPRESSION: Diffuse soft tissue swelling. No osseous abnormality. Electronically Signed   By: Norleen DELENA Kil M.D.   On: 01/06/2024 10:58        Scheduled Meds:  acetaminophen   1,000 mg Oral TID   enoxaparin  (LOVENOX ) injection  40 mg Subcutaneous Q24H   folic acid   1 mg Oral Daily   gabapentin   300 mg Oral TID   LORazepam   0-4 mg Oral Q6H   Followed by   NOREEN ON 01/08/2024] LORazepam   0-4 mg Oral Q12H   methocarbamol   500 mg Oral TID   multivitamin with minerals  1 tablet Oral Daily   thiamine   100 mg Oral Daily   Or   thiamine   100 mg Intravenous Daily   Continuous Infusions:  linezolid  (ZYVOX ) IV 600 mg (01/07/24 0919)     LOS: 1 day       Renato Applebaum, MD [facility]

## 2024-01-07 NOTE — Plan of Care (Signed)

## 2024-01-07 NOTE — Telephone Encounter (Signed)
 Attempted to call, no answer.

## 2024-01-08 DIAGNOSIS — L03114 Cellulitis of left upper limb: Secondary | ICD-10-CM | POA: Diagnosis not present

## 2024-01-08 MED ORDER — BUPRENORPHINE HCL-NALOXONE HCL 8-2 MG SL SUBL
1.0000 | SUBLINGUAL_TABLET | Freq: Every day | SUBLINGUAL | Status: DC
  Administered 2024-01-08: 1 via SUBLINGUAL
  Filled 2024-01-08: qty 1

## 2024-01-08 MED ORDER — CEPHALEXIN 500 MG PO CAPS: 500.0000 mg | ORAL_CAPSULE | Freq: Three times a day (TID) | ORAL | 0 refills | Status: AC

## 2024-01-08 MED ORDER — DOXYCYCLINE HYCLATE 100 MG PO TABS: 100.0000 mg | ORAL_TABLET | Freq: Two times a day (BID) | ORAL | 0 refills | Status: AC

## 2024-01-08 NOTE — Progress Notes (Signed)
 PROGRESS NOTE    [name]  FMW:[id number] DOB: 08-06-1970 DOA: 01/06/2024 PCP: Wendee Lynwood HERO, NP    Brief Narrative:  53 year old with history of IV drug use, patient denies active use however positive for amphetamines and benzos, hypertension, depression came to the emergency room with left hand swelling for 2 days.  Patient reports backhanded on nail covered with pesticide before starting the swelling. In the emergency room hypertensive.  WBC count normal.  CT scan of the hand and forearm and wrist shows subcutaneous edema in the dorsal aspect of distal forearm, no organized abscess or foreign body.  Due to significant symptoms started on IV antibiotics and admitted to the hospital.  Subjective: Patient seen and examined.  Eyes closed and laying in bed most of the time.  On interview, he told me he can move his arm and hands better and wants to go home. Apparently, he is on Suboxone  and we had not started back on.  Will put back on Suboxone . On interview, remains mostly sleepy and lethargic.  Hand swelling is improving but is still very painful. Recommended him to stay 1 more day.  Assessment & Plan:   Left arm cellulitis: Currently does not have any evidence of abscess or drainable collection. Continue IV Zyvox .  Blood cultures negative so far. Elevate arm.  Continue to mobilize.  Adequate pain medications.  Will monitor clinically.  Suspected alcohol withdrawal syndrome: Patient shows tremulousness, tachycardia and hypertension with high CIWA score. UDS positive for amphetamine  and benzos. Alcohol level was not measured on arrival.  Denies use. Symptomatic management.  Pain medications.  Delirium precautions.  Fall precautions. Benzo based CIWA protocol.  Multivitamins. Uncomplicated so far but looks lethargic. Will place patient back on Suboxone .  Hypertension: Currently on as needed medications.  Not on treatment at home.  DVT prophylaxis: enoxaparin  (LOVENOX )  injection 40 mg Start: 01/06/24 1600   Code Status: Full code Family Communication: None at the bedside Disposition Plan: Status is: Inpatient Remains inpatient appropriate because: IV antibiotics     Consultants:  None  Procedures:  None  Antimicrobials:  Zyvox  11/25---     Objective: Vitals:   01/07/24 2055 01/07/24 2336 01/08/24 0149 01/08/24 0533  BP: (!) 180/112 (!) 150/107 (!) 162/100 (!) 160/105  Pulse: 93  80 98  Resp: 18   19  Temp: 98.6 F (37 C)   98.3 F (36.8 C)  TempSrc: Oral     SpO2: 100% 100%  96%  Weight:      Height:        Intake/Output Summary (Last 24 hours) at 01/08/2024 1039 Last data filed at 01/08/2024 0919 Gross per 24 hour  Intake 540 ml  Output --  Net 540 ml   Filed Weights   01/06/24 0912  Weight: 95.3 kg    Examination:  General exam: Mostly sleepy.  Appropriately answering.  Uncomfortable on exam of the hand. Respiratory system: Clear to auscultation. Respiratory effort normal.  On room air. Cardiovascular system: S1 & S2 heard, RRR. No JVD, murmurs, rubs, gallops or clicks. No pedal edema. Gastrointestinal system: Abdomen is nondistended, soft and nontender. No organomegaly or masses felt. Normal bowel sounds heard. Central nervous system: Alert and awake.  Mostly oriented.  Falls back asleep.  Difficult to keep awake on interview.  Moves all extremities equally. Extremities: Symmetric 5 x 5 power. Skin:  Significant swelling of the dorsum of the wrist, dorsum of the hand, forearm without any crepitus, no redness or erythema.  Data Reviewed: I have personally reviewed following labs and imaging studies  CBC: Recent Labs  Lab 01/06/24 0920 01/06/24 1436  WBC 7.8 7.7  HGB 14.2 13.3  HCT 41.4 39.3  MCV 88.5 89.3  PLT 217 201   Basic Metabolic Panel: Recent Labs  Lab 01/06/24 0920 01/06/24 1436  NA 137  --   K 3.7  --   CL 105  --   CO2 22  --   GLUCOSE 116*  --   BUN 17  --   CREATININE 0.85 0.87   CALCIUM 8.6*  --    GFR: Estimated Creatinine Clearance: 112.5 mL/min (by C-G formula based on SCr of 0.87 mg/dL). Liver Function Tests: No results for input(s): AST, ALT, ALKPHOS, BILITOT, PROT, ALBUMIN in the last 168 hours. No results for input(s): LIPASE, AMYLASE in the last 168 hours. No results for input(s): AMMONIA in the last 168 hours. Coagulation Profile: No results for input(s): INR, PROTIME in the last 168 hours. Cardiac Enzymes: Recent Labs  Lab 01/06/24 0920  CKTOTAL 246   BNP (last 3 results) No results for input(s): PROBNP in the last 8760 hours. HbA1C: No results for input(s): HGBA1C in the last 72 hours. CBG: No results for input(s): GLUCAP in the last 168 hours. Lipid Profile: No results for input(s): CHOL, HDL, LDLCALC, TRIG, CHOLHDL, LDLDIRECT in the last 72 hours. Thyroid  Function Tests: No results for input(s): TSH, T4TOTAL, FREET4, T3FREE, THYROIDAB in the last 72 hours. Anemia Panel: No results for input(s): VITAMINB12, FOLATE, FERRITIN, TIBC, IRON, RETICCTPCT in the last 72 hours. Sepsis Labs: Recent Labs  Lab 01/06/24 0951  LATICACIDVEN 0.9    Recent Results (from the past 240 hours)  Culture, blood (Routine X 2) w Reflex to ID Panel     Status: None (Preliminary result)   Collection Time: 01/06/24  2:36 PM   Specimen: BLOOD  Result Value Ref Range Status   Specimen Description BLOOD SITE NOT SPECIFIED  Final   Special Requests   Final    BOTTLES DRAWN AEROBIC AND ANAEROBIC Blood Culture results may not be optimal due to an inadequate volume of blood received in culture bottles   Culture   Final    NO GROWTH 2 DAYS Performed at [facility], [address], [place], [place] [address]    Report Status PENDING  Incomplete  Culture, blood (Routine X 2) w Reflex to ID Panel     Status: None (Preliminary result)   Collection Time: 01/07/24  1:07 AM   Specimen: BLOOD RIGHT ARM   Result Value Ref Range Status   Specimen Description   Final    BLOOD RIGHT ARM Performed at [facility], [address], [place], [place] [address]    Special Requests   Final    BOTTLES DRAWN AEROBIC AND ANAEROBIC Blood Culture results may not be optimal due to an inadequate volume of blood received in culture bottles Performed at [facility], [address] [place], [place] [address]    Culture   Final    NO GROWTH 1 DAY Performed at [facility], [address] [place], [place] [address]    Report Status PENDING  Incomplete         Radiology Studies: CT HAND LEFT W CONTRAST Result Date: 01/06/2024 CLINICAL DATA:  Soft tissue mass, wrist, deep; Soft tissue infection suspected, hand, no prior imaging Increased pain and swelling in the left wrist and hand after penetrating injury  3 days ago. EXAM: CT OF THE UPPER LEFT EXTREMITY WITH CONTRAST TECHNIQUE: Multidetector CT imaging of the left wrist and left hand was performed according to the standard protocol following intravenous contrast administration. RADIATION DOSE REDUCTION: This exam was performed according to the departmental dose-optimization program which includes automated exposure control, adjustment of the mA and/or kV according to patient size and/or use of iterative reconstruction technique. CONTRAST:  75mL OMNIPAQUE  IOHEXOL  350 MG/ML SOLN COMPARISON:  None Available. FINDINGS: Bones/Joint/Cartilage Forearm findings dictated separately. Examination was performed with the left arm by the patient's side. Resulting significant beam hardening artifact, limiting detail in the wrist and hand. No evidence of acute fracture, dislocation or bone destruction. The joint spaces appear preserved, and no significant joint effusions are identified. Ligaments Suboptimally assessed by CT. Muscles and Tendons No focal muscular abnormalities are identified. The wrist and hand tendons appear intact without obvious  tenosynovitis; evaluation limited by beam hardening artifact. Soft tissues Subcutaneous edema in the dorsal aspect of the distal forearm, wrist and hand. No organized fluid collection, foreign body or soft tissue emphysema demonstrated. IMPRESSION: 1. Subcutaneous edema in the dorsal aspect of the distal forearm, wrist and hand, nonspecific, but potentially reflecting cellulitis. No organized fluid collection, foreign body or soft tissue emphysema demonstrated. 2. No acute osseous findings or significant arthropathic changes. 3. Detail in the wrist and hand is limited by beam hardening artifact due to overlap with the patient's trunk. If concern for acute osseous abnormality, recommend plain film correlation. 4. Forearm findings dictated separately. Electronically Signed   By: Elsie Perone M.D.   On: 01/06/2024 12:06   CT WRIST LEFT W CONTRAST Result Date: 01/06/2024 CLINICAL DATA:  Soft tissue mass, wrist, deep; Soft tissue infection suspected, hand, no prior imaging Increased pain and swelling in the left wrist and hand after penetrating injury 3 days ago. EXAM: CT OF THE UPPER LEFT EXTREMITY WITH CONTRAST TECHNIQUE: Multidetector CT imaging of the left wrist and left hand was performed according to the standard protocol following intravenous contrast administration. RADIATION DOSE REDUCTION: This exam was performed according to the departmental dose-optimization program which includes automated exposure control, adjustment of the mA and/or kV according to patient size and/or use of iterative reconstruction technique. CONTRAST:  75mL OMNIPAQUE  IOHEXOL  350 MG/ML SOLN COMPARISON:  None Available. FINDINGS: Bones/Joint/Cartilage Forearm findings dictated separately. Examination was performed with the left arm by the patient's side. Resulting significant beam hardening artifact, limiting detail in the wrist and hand. No evidence of acute fracture, dislocation or bone destruction. The joint spaces appear  preserved, and no significant joint effusions are identified. Ligaments Suboptimally assessed by CT. Muscles and Tendons No focal muscular abnormalities are identified. The wrist and hand tendons appear intact without obvious tenosynovitis; evaluation limited by beam hardening artifact. Soft tissues Subcutaneous edema in the dorsal aspect of the distal forearm, wrist and hand. No organized fluid collection, foreign body or soft tissue emphysema demonstrated. IMPRESSION: 1. Subcutaneous edema in the dorsal aspect of the distal forearm, wrist and hand, nonspecific, but potentially reflecting cellulitis. No organized fluid collection, foreign body or soft tissue emphysema demonstrated. 2. No acute osseous findings or significant arthropathic changes. 3. Detail in the wrist and hand is limited by beam hardening artifact due to overlap with the patient's trunk. If concern for acute osseous abnormality, recommend plain film correlation. 4. Forearm findings dictated separately. Electronically Signed   By: Elsie Perone M.D.   On: 01/06/2024 12:06   CT FOREARM LEFT W CONTRAST Result Date:  01/06/2024 CLINICAL DATA:  Soft tissue mass, forearm, deep Worsening left forearm pain and swelling after penetrating injury 3 days ago. EXAM: CT OF THE UPPER LEFT EXTREMITY WITH CONTRAST TECHNIQUE: Multidetector CT imaging of the left forearm was performed according to the standard protocol following intravenous contrast administration. RADIATION DOSE REDUCTION: This exam was performed according to the departmental dose-optimization program which includes automated exposure control, adjustment of the mA and/or kV according to patient size and/or use of iterative reconstruction technique. CONTRAST:  75mL OMNIPAQUE  IOHEXOL  350 MG/ML SOLN COMPARISON:  Same-day radiographs FINDINGS: Bones/Joint/Cartilage Wrist and hand findings are dictated separately. No evidence of acute fracture, dislocation or osteomyelitis within the forearm. No  significant arthropathy or effusion at the elbow. Ligaments Suboptimally assessed by CT. Muscles and Tendons No focal muscular abnormalities are identified. Specifically, no suspicious enhancement or focal fluid collection. Soft tissues A specific wound is not marked or otherwise indicated for this examination. Mild subcutaneous edema noted, greatest in the dorsal and ulnar aspects of the distal forearm. No focal fluid collection, foreign body or soft tissue emphysema identified. IMPRESSION: 1. Mild subcutaneous edema in the distal forearm, greatest in the dorsal and ulnar aspects of the distal forearm. No focal fluid collection, foreign body or soft tissue emphysema identified. 2. No evidence of osteomyelitis or acute osseous findings. 3. Wrist and hand findings are dictated separately. Electronically Signed   By: Elsie Perone M.D.   On: 01/06/2024 12:00        Scheduled Meds:  acetaminophen   1,000 mg Oral TID   buprenorphine -naloxone   1 tablet Sublingual Daily   enoxaparin  (LOVENOX ) injection  40 mg Subcutaneous Q24H   folic acid   1 mg Oral Daily   gabapentin   300 mg Oral TID   LORazepam   0-4 mg Oral Q6H   Followed by   LORazepam   0-4 mg Oral Q12H   methocarbamol   500 mg Oral TID   multivitamin with minerals  1 tablet Oral Daily   thiamine   100 mg Oral Daily   Continuous Infusions:  linezolid  (ZYVOX ) IV 600 mg (01/08/24 1018)     LOS: 2 days       Renato Applebaum, MD [facility]

## 2024-01-08 NOTE — Progress Notes (Signed)
 Patient states that he is fine and is going to go ahead and go home, IV removed, AMA paper signed, MD made aware

## 2024-01-09 NOTE — Discharge Summary (Signed)
 Physician Discharge Summary  [name] FMW:[id number] DOB: 24-Feb-1970 DOA: 01/06/2024  PCP: Wendee Lynwood HERO, NP  Admit date: 01/06/2024 Discharge date: 01/09/2024  Admitted From: Home Disposition: Home  Recommendations for Outpatient Follow-up:  Follow up with PCP in 1-2 weeks Come back to emergency room if increased swelling or pain    Discharge Condition: Fair, left AMA CODE STATUS: Full code Diet recommendation: Regular diet  Discharge summary: 53 year old with previous history of IV drug use and denies active use however positive for amphetamine  and benzos, hypertension and depression came to the emergency room with 2 days of left hand swelling, reportedly backhanded on nail covered with pesticide before starting the swelling.  In the emergency room hypertensive, WBC count normal.  CT scan of the hand and forearm and wrist with subcutaneous edema but no abscess.  No foreign body.  Due to significant swelling, he was admitted to the hospital and treated with IV Zyvox  with some clinical improvement.  Patient was having some clinical improvement but was still lethargic, complaining of pain so was kept in the hospital on symptomatic management including IV antibiotics.  He had shown some evidence of tremulousness and tachycardia and was treated with CIWA scale and benzodiazepines.  Patient however, decided to go home and walked out of the hospital AGAINST MEDICAL ADVICE.  Patient was given prescription of doxycycline  and Keflex  for 7 additional days to complete treatment for cellulitis and was advised to come back to the hospital for any increased pain or swelling, redness or erythema or decreased function of the hand.  At the time of discharge, he was able to make his own clinical decisions including declining treatment.  Discharge Diagnoses:  Principal Problem:   Left arm cellulitis    Discharge Instructions   Allergies as of 01/08/2024       Reactions   Codeine  Itching, Nausea Only   Opioid-induced pruritus   Tylenol  [acetaminophen ] Other (See Comments)   Headache         Medication List     TAKE these medications    cephALEXin  500 MG capsule Commonly known as: KEFLEX  Take 1 capsule (500 mg total) by mouth 3 (three) times daily for 7 days.   doxycycline  100 MG tablet Commonly known as: VIBRA -TABS Take 1 tablet (100 mg total) by mouth 2 (two) times daily for 7 days.       ASK your doctor about these medications    alprazolam  2 MG tablet Commonly known as: XANAX  TAKE 1 TABLET BY MOUTH 3 TIMES DAILY AS NEEDED FOR ANXIETY Ask about: Which instructions should I use?   amphetamine -dextroamphetamine  20 MG tablet Commonly known as: ADDERALL Take 20 mg by mouth 3 (three) times daily.   Buprenorphine  HCl-Naloxone  HCl 8-2 MG Film Place 0.5-1 Film under the tongue 2 (two) times daily as needed (anxiety, withrawel). Ask about: Which instructions should I use?   naloxone  4 MG/0.1ML Liqd nasal spray kit Commonly known as: NARCAN  Administer in case of opiate overdose   sildenafil  100 MG tablet Commonly known as: VIAGRA  Take 100 mg by mouth daily as needed for erectile dysfunction.        Allergies  Allergen Reactions   Codeine Itching and Nausea Only    Opioid-induced pruritus   Tylenol  [Acetaminophen ] Other (See Comments)    Headache     Consultations: None   Procedures/Studies: CT HAND LEFT W CONTRAST Result Date: 01/06/2024 CLINICAL DATA:  Soft tissue mass, wrist, deep; Soft tissue infection suspected, hand, no  prior imaging Increased pain and swelling in the left wrist and hand after penetrating injury 3 days ago. EXAM: CT OF THE UPPER LEFT EXTREMITY WITH CONTRAST TECHNIQUE: Multidetector CT imaging of the left wrist and left hand was performed according to the standard protocol following intravenous contrast administration. RADIATION DOSE REDUCTION: This exam was performed according to the departmental  dose-optimization program which includes automated exposure control, adjustment of the mA and/or kV according to patient size and/or use of iterative reconstruction technique. CONTRAST:  75mL OMNIPAQUE  IOHEXOL  350 MG/ML SOLN COMPARISON:  None Available. FINDINGS: Bones/Joint/Cartilage Forearm findings dictated separately. Examination was performed with the left arm by the patient's side. Resulting significant beam hardening artifact, limiting detail in the wrist and hand. No evidence of acute fracture, dislocation or bone destruction. The joint spaces appear preserved, and no significant joint effusions are identified. Ligaments Suboptimally assessed by CT. Muscles and Tendons No focal muscular abnormalities are identified. The wrist and hand tendons appear intact without obvious tenosynovitis; evaluation limited by beam hardening artifact. Soft tissues Subcutaneous edema in the dorsal aspect of the distal forearm, wrist and hand. No organized fluid collection, foreign body or soft tissue emphysema demonstrated. IMPRESSION: 1. Subcutaneous edema in the dorsal aspect of the distal forearm, wrist and hand, nonspecific, but potentially reflecting cellulitis. No organized fluid collection, foreign body or soft tissue emphysema demonstrated. 2. No acute osseous findings or significant arthropathic changes. 3. Detail in the wrist and hand is limited by beam hardening artifact due to overlap with the patient's trunk. If concern for acute osseous abnormality, recommend plain film correlation. 4. Forearm findings dictated separately. Electronically Signed   By: Elsie Perone M.D.   On: 01/06/2024 12:06   CT WRIST LEFT W CONTRAST Result Date: 01/06/2024 CLINICAL DATA:  Soft tissue mass, wrist, deep; Soft tissue infection suspected, hand, no prior imaging Increased pain and swelling in the left wrist and hand after penetrating injury 3 days ago. EXAM: CT OF THE UPPER LEFT EXTREMITY WITH CONTRAST TECHNIQUE: Multidetector  CT imaging of the left wrist and left hand was performed according to the standard protocol following intravenous contrast administration. RADIATION DOSE REDUCTION: This exam was performed according to the departmental dose-optimization program which includes automated exposure control, adjustment of the mA and/or kV according to patient size and/or use of iterative reconstruction technique. CONTRAST:  75mL OMNIPAQUE  IOHEXOL  350 MG/ML SOLN COMPARISON:  None Available. FINDINGS: Bones/Joint/Cartilage Forearm findings dictated separately. Examination was performed with the left arm by the patient's side. Resulting significant beam hardening artifact, limiting detail in the wrist and hand. No evidence of acute fracture, dislocation or bone destruction. The joint spaces appear preserved, and no significant joint effusions are identified. Ligaments Suboptimally assessed by CT. Muscles and Tendons No focal muscular abnormalities are identified. The wrist and hand tendons appear intact without obvious tenosynovitis; evaluation limited by beam hardening artifact. Soft tissues Subcutaneous edema in the dorsal aspect of the distal forearm, wrist and hand. No organized fluid collection, foreign body or soft tissue emphysema demonstrated. IMPRESSION: 1. Subcutaneous edema in the dorsal aspect of the distal forearm, wrist and hand, nonspecific, but potentially reflecting cellulitis. No organized fluid collection, foreign body or soft tissue emphysema demonstrated. 2. No acute osseous findings or significant arthropathic changes. 3. Detail in the wrist and hand is limited by beam hardening artifact due to overlap with the patient's trunk. If concern for acute osseous abnormality, recommend plain film correlation. 4. Forearm findings dictated separately. Electronically Signed   By: Elsie Perone  M.D.   On: 01/06/2024 12:06   CT FOREARM LEFT W CONTRAST Result Date: 01/06/2024 CLINICAL DATA:  Soft tissue mass, forearm, deep  Worsening left forearm pain and swelling after penetrating injury 3 days ago. EXAM: CT OF THE UPPER LEFT EXTREMITY WITH CONTRAST TECHNIQUE: Multidetector CT imaging of the left forearm was performed according to the standard protocol following intravenous contrast administration. RADIATION DOSE REDUCTION: This exam was performed according to the departmental dose-optimization program which includes automated exposure control, adjustment of the mA and/or kV according to patient size and/or use of iterative reconstruction technique. CONTRAST:  75mL OMNIPAQUE  IOHEXOL  350 MG/ML SOLN COMPARISON:  Same-day radiographs FINDINGS: Bones/Joint/Cartilage Wrist and hand findings are dictated separately. No evidence of acute fracture, dislocation or osteomyelitis within the forearm. No significant arthropathy or effusion at the elbow. Ligaments Suboptimally assessed by CT. Muscles and Tendons No focal muscular abnormalities are identified. Specifically, no suspicious enhancement or focal fluid collection. Soft tissues A specific wound is not marked or otherwise indicated for this examination. Mild subcutaneous edema noted, greatest in the dorsal and ulnar aspects of the distal forearm. No focal fluid collection, foreign body or soft tissue emphysema identified. IMPRESSION: 1. Mild subcutaneous edema in the distal forearm, greatest in the dorsal and ulnar aspects of the distal forearm. No focal fluid collection, foreign body or soft tissue emphysema identified. 2. No evidence of osteomyelitis or acute osseous findings. 3. Wrist and hand findings are dictated separately. Electronically Signed   By: Elsie Perone M.D.   On: 01/06/2024 12:00   DG Forearm Left Result Date: 01/06/2024 CLINICAL DATA:  Left forearm pain and swelling. EXAM: LEFT FOREARM - 2 VIEW COMPARISON:  None Available. FINDINGS: There is no evidence of fracture or other focal bone lesions. Diffuse soft tissue swelling is seen. No evidence of soft tissue gas or  radiopaque foreign body. IMPRESSION: Diffuse soft tissue swelling. No osseous abnormality. Electronically Signed   By: Norleen DELENA Kil M.D.   On: 01/06/2024 10:58   (Echo, Carotid, EGD, Colonoscopy, ERCP)    Subjective: Patient seen in the morning rounds with slightly improved but persistent pain at the wrist.  In the afternoon, he decided to walk out stating that he feels fine.  Even though he terminated treatment earlier, he was prescribed antibiotics to take at home.   Discharge Exam: Vitals:   01/08/24 0533 01/08/24 0814  BP: (!) 160/105 (!) 160/105  Pulse: 98 98  Resp: 19   Temp: 98.3 F (36.8 C)   SpO2: 96%    Vitals:   01/07/24 2336 01/08/24 0149 01/08/24 0533 01/08/24 0814  BP:  (!) 162/100 (!) 160/105 (!) 160/105  Pulse:  80 98 98  Resp:   19   Temp:   98.3 F (36.8 C)   TempSrc:      SpO2: 100%  96%   Weight:      Height:        Examined in the morning rounds as documented.    The results of significant diagnostics from this hospitalization (including imaging, microbiology, ancillary and laboratory) are listed below for reference.     Microbiology: Recent Results (from the past 240 hours)  Culture, blood (Routine X 2) w Reflex to ID Panel     Status: None (Preliminary result)   Collection Time: 01/06/24  2:36 PM   Specimen: BLOOD  Result Value Ref Range Status   Specimen Description BLOOD SITE NOT SPECIFIED  Final   Special Requests   Final    BOTTLES  DRAWN AEROBIC AND ANAEROBIC Blood Culture results may not be optimal due to an inadequate volume of blood received in culture bottles   Culture   Final    NO GROWTH 2 DAYS Performed at [facility], [address], [place], [place] [address]    Report Status PENDING  Incomplete  Culture, blood (Routine X 2) w Reflex to ID Panel     Status: None (Preliminary result)   Collection Time: 01/07/24  1:07 AM   Specimen: BLOOD RIGHT ARM  Result Value Ref Range Status   Specimen Description   Final    BLOOD  RIGHT ARM Performed at [facility], [address], [place], [place] [address]    Special Requests   Final    BOTTLES DRAWN AEROBIC AND ANAEROBIC Blood Culture results may not be optimal due to an inadequate volume of blood received in culture bottles Performed at [facility], [address], [place], [place] [address]    Culture   Final    NO GROWTH 1 DAY Performed at [facility], [address], [place], [place] [address]    Report Status PENDING  Incomplete     Labs: BNP (last 3 results) No results for input(s): BNP in the last 8760 hours. Basic Metabolic Panel: Recent Labs  Lab 01/06/24 0920 01/06/24 1436  NA 137  --   K 3.7  --   CL 105  --   CO2 22  --   GLUCOSE 116*  --   BUN 17  --   CREATININE 0.85 0.87  CALCIUM 8.6*  --    Liver Function Tests: No results for input(s): AST, ALT, ALKPHOS, BILITOT, PROT, ALBUMIN in the last 168 hours. No results for input(s): LIPASE, AMYLASE in the last 168 hours. No results for input(s): AMMONIA in the last 168 hours. CBC: Recent Labs  Lab 01/06/24 0920 01/06/24 1436  WBC 7.8 7.7  HGB 14.2 13.3  HCT 41.4 39.3  MCV 88.5 89.3  PLT 217 201   Cardiac Enzymes: Recent Labs  Lab 01/06/24 0920  CKTOTAL 246   BNP: Invalid input(s): POCBNP CBG: No results for input(s): GLUCAP in the last 168 hours. D-Dimer No results for input(s): DDIMER in the last 72 hours. Hgb A1c No results for input(s): HGBA1C in the last 72 hours. Lipid Profile No results for input(s): CHOL, HDL, LDLCALC, TRIG, CHOLHDL, LDLDIRECT in the last 72 hours. Thyroid  function studies No results for input(s): TSH, T4TOTAL, T3FREE, THYROIDAB in the last 72 hours.  Invalid input(s): FREET3 Anemia work up No results for input(s): VITAMINB12, FOLATE, FERRITIN, TIBC, IRON, RETICCTPCT in the last 72 hours. Urinalysis    Component Value Date/Time   COLORURINE  COLORLESS (A) 01/06/2024 1436   APPEARANCEUR CLEAR 01/06/2024 1436   LABSPEC 1.018 01/06/2024 1436   PHURINE 6.0 01/06/2024 1436   GLUCOSEU NEGATIVE 01/06/2024 1436   HGBUR NEGATIVE 01/06/2024 1436   BILIRUBINUR NEGATIVE 01/06/2024 1436   KETONESUR NEGATIVE 01/06/2024 1436   PROTEINUR NEGATIVE 01/06/2024 1436   UROBILINOGEN 0.2 12/01/2008 0412   NITRITE NEGATIVE 01/06/2024 1436   LEUKOCYTESUR NEGATIVE 01/06/2024 1436   Sepsis Labs Recent Labs  Lab 01/06/24 0920 01/06/24 1436  WBC 7.8 7.7   Microbiology Recent Results (from the past 240 hours)  Culture, blood (Routine X 2) w Reflex to ID Panel     Status: None (Preliminary result)   Collection Time: 01/06/24  2:36 PM   Specimen: BLOOD  Result Value Ref  Range Status   Specimen Description BLOOD SITE NOT SPECIFIED  Final   Special Requests   Final    BOTTLES DRAWN AEROBIC AND ANAEROBIC Blood Culture results may not be optimal due to an inadequate volume of blood received in culture bottles   Culture   Final    NO GROWTH 2 DAYS Performed at [facility], [address] [place], [place] [address]    Report Status PENDING  Incomplete  Culture, blood (Routine X 2) w Reflex to ID Panel     Status: None (Preliminary result)   Collection Time: 01/07/24  1:07 AM   Specimen: BLOOD RIGHT ARM  Result Value Ref Range Status   Specimen Description   Final    BLOOD RIGHT ARM Performed at [facility], [address] [place], [place] [address]    Special Requests   Final    BOTTLES DRAWN AEROBIC AND ANAEROBIC Blood Culture results may not be optimal due to an inadequate volume of blood received in culture bottles Performed at [facility], [address] [place], [place] [address]    Culture   Final    NO GROWTH 1 DAY Performed at [facility], [address], [place], [place] [address]    Report Status PENDING  Incomplete     Time coordinating discharge:0  minutes  SIGNED:   Renato Applebaum, MD  [facility] 01/09/2024, 8:22 AM

## 2024-01-11 LAB — CULTURE, BLOOD (ROUTINE X 2): Culture: NO GROWTH

## 2024-01-12 LAB — CULTURE, BLOOD (ROUTINE X 2): Culture: NO GROWTH

## 2024-01-19 NOTE — Telephone Encounter (Signed)
 Lvm asking pt to call back. Pls find out if pt still wants to see dermatology.

## 2024-01-20 NOTE — Telephone Encounter (Signed)
 noted

## 2024-01-20 NOTE — Telephone Encounter (Signed)
 We have called patient x 3 no call back will mail letter and close referral.
